# Patient Record
Sex: Male | Born: 1950 | Race: Black or African American | Hispanic: No | Marital: Single | Smoking: Former smoker
Health system: Southern US, Community
[De-identification: ages and names within clinical notes are randomized; demographics above are authoritative.]

## PROBLEM LIST (undated history)

## (undated) DIAGNOSIS — T7840XA Allergy, unspecified, initial encounter: Secondary | ICD-10-CM

## (undated) DIAGNOSIS — I10 Essential (primary) hypertension: Secondary | ICD-10-CM

## (undated) DIAGNOSIS — K5792 Diverticulitis of intestine, part unspecified, without perforation or abscess without bleeding: Secondary | ICD-10-CM

## (undated) DIAGNOSIS — E785 Hyperlipidemia, unspecified: Secondary | ICD-10-CM

## (undated) DIAGNOSIS — Z860100 Personal history of colon polyps, unspecified: Secondary | ICD-10-CM

## (undated) DIAGNOSIS — F17200 Nicotine dependence, unspecified, uncomplicated: Secondary | ICD-10-CM

## (undated) DIAGNOSIS — N529 Male erectile dysfunction, unspecified: Secondary | ICD-10-CM

## (undated) HISTORY — DX: Diverticulitis of intestine, part unspecified, without perforation or abscess without bleeding: K57.92

## (undated) HISTORY — DX: Essential (primary) hypertension: I10

## (undated) HISTORY — PX: COLONOSCOPY: SHX174

## (undated) HISTORY — DX: Allergy, unspecified, initial encounter: T78.40XA

## (undated) HISTORY — DX: Hyperlipidemia, unspecified: E78.5

## (undated) HISTORY — DX: Nicotine dependence, unspecified, uncomplicated: F17.200

## (undated) HISTORY — DX: Male erectile dysfunction, unspecified: N52.9

## (undated) HISTORY — DX: Personal history of colon polyps, unspecified: Z86.0100

## (undated) HISTORY — DX: Personal history of colonic polyps: Z86.010

## (undated) HISTORY — PX: POLYPECTOMY: SHX149

---

## 2001-12-14 ENCOUNTER — Encounter: Admission: RE | Admit: 2001-12-14 | Discharge: 2001-12-22 | Payer: Self-pay | Admitting: *Deleted

## 2002-12-18 ENCOUNTER — Encounter
Admission: RE | Admit: 2002-12-18 | Discharge: 2003-01-01 | Payer: Self-pay | Admitting: Physical Medicine & Rehabilitation

## 2005-11-15 ENCOUNTER — Encounter: Admission: RE | Admit: 2005-11-15 | Discharge: 2005-11-15 | Payer: Self-pay | Admitting: Family Medicine

## 2005-11-18 ENCOUNTER — Encounter: Admission: RE | Admit: 2005-11-18 | Discharge: 2005-11-18 | Payer: Self-pay | Admitting: Family Medicine

## 2005-11-25 ENCOUNTER — Ambulatory Visit: Payer: Self-pay | Admitting: Gastroenterology

## 2005-11-30 ENCOUNTER — Emergency Department (HOSPITAL_COMMUNITY): Admission: EM | Admit: 2005-11-30 | Discharge: 2005-12-01 | Payer: Self-pay | Admitting: Emergency Medicine

## 2005-12-01 ENCOUNTER — Ambulatory Visit: Payer: Self-pay | Admitting: Family Medicine

## 2005-12-08 ENCOUNTER — Ambulatory Visit: Payer: Self-pay | Admitting: Family Medicine

## 2005-12-14 ENCOUNTER — Encounter (INDEPENDENT_AMBULATORY_CARE_PROVIDER_SITE_OTHER): Payer: Self-pay | Admitting: Specialist

## 2005-12-14 ENCOUNTER — Ambulatory Visit: Payer: Self-pay | Admitting: Gastroenterology

## 2005-12-14 LAB — HM COLONOSCOPY

## 2006-01-12 ENCOUNTER — Ambulatory Visit: Payer: Self-pay | Admitting: Family Medicine

## 2006-05-20 ENCOUNTER — Ambulatory Visit: Payer: Self-pay | Admitting: Family Medicine

## 2006-07-21 ENCOUNTER — Ambulatory Visit: Payer: Self-pay | Admitting: Family Medicine

## 2006-10-27 ENCOUNTER — Ambulatory Visit: Payer: Self-pay | Admitting: Family Medicine

## 2006-12-12 ENCOUNTER — Ambulatory Visit: Payer: Self-pay | Admitting: Family Medicine

## 2006-12-29 ENCOUNTER — Encounter: Admission: RE | Admit: 2006-12-29 | Discharge: 2006-12-29 | Payer: Self-pay | Admitting: Family Medicine

## 2007-01-19 ENCOUNTER — Ambulatory Visit: Payer: Self-pay | Admitting: Family Medicine

## 2008-07-04 ENCOUNTER — Ambulatory Visit: Payer: Self-pay | Admitting: Family Medicine

## 2008-11-18 ENCOUNTER — Encounter (INDEPENDENT_AMBULATORY_CARE_PROVIDER_SITE_OTHER): Payer: Self-pay | Admitting: *Deleted

## 2009-04-23 ENCOUNTER — Ambulatory Visit: Payer: Self-pay | Admitting: Family Medicine

## 2009-07-14 ENCOUNTER — Ambulatory Visit: Payer: Self-pay | Admitting: Family Medicine

## 2009-07-24 ENCOUNTER — Ambulatory Visit: Payer: Self-pay | Admitting: Family Medicine

## 2009-08-08 ENCOUNTER — Telehealth: Payer: Self-pay | Admitting: Gastroenterology

## 2009-08-12 ENCOUNTER — Ambulatory Visit: Payer: Self-pay | Admitting: Family Medicine

## 2009-09-09 ENCOUNTER — Ambulatory Visit: Payer: Self-pay | Admitting: Physician Assistant

## 2010-03-09 ENCOUNTER — Ambulatory Visit: Payer: Self-pay | Admitting: Family Medicine

## 2010-07-30 NOTE — Progress Notes (Signed)
Summary: Schedule Colonoscopy  Phone Note Outgoing Call Call back at [facility] Phone [phone]   Call placed by: Harlow Mares CMA Duncan Dull),  August 08, 2009 3:15 PM Call placed to: Patient Summary of Call: Left message on patients machine to call back. patient needs colonoscopy Initial call taken by: Harlow Mares CMA Duncan Dull),  August 08, 2009 3:16 PM  Follow-up for Phone Call        Left message on patients machine to call back.  Follow-up by: Harlow Mares CMA Duncan Dull),  August 20, 2009 9:07 AM

## 2010-08-28 ENCOUNTER — Ambulatory Visit (INDEPENDENT_AMBULATORY_CARE_PROVIDER_SITE_OTHER): Payer: Managed Care, Other (non HMO) | Admitting: Family Medicine

## 2010-08-28 DIAGNOSIS — I10 Essential (primary) hypertension: Secondary | ICD-10-CM

## 2010-08-28 DIAGNOSIS — E785 Hyperlipidemia, unspecified: Secondary | ICD-10-CM

## 2010-08-28 DIAGNOSIS — E119 Type 2 diabetes mellitus without complications: Secondary | ICD-10-CM

## 2010-08-28 DIAGNOSIS — Z79899 Other long term (current) drug therapy: Secondary | ICD-10-CM

## 2010-09-08 ENCOUNTER — Ambulatory Visit (INDEPENDENT_AMBULATORY_CARE_PROVIDER_SITE_OTHER): Payer: Managed Care, Other (non HMO) | Admitting: Family Medicine

## 2010-09-08 DIAGNOSIS — R110 Nausea: Secondary | ICD-10-CM

## 2010-09-08 DIAGNOSIS — J309 Allergic rhinitis, unspecified: Secondary | ICD-10-CM

## 2010-12-16 ENCOUNTER — Encounter: Payer: Self-pay | Admitting: Family Medicine

## 2010-12-16 DIAGNOSIS — I10 Essential (primary) hypertension: Secondary | ICD-10-CM

## 2010-12-16 DIAGNOSIS — E291 Testicular hypofunction: Secondary | ICD-10-CM | POA: Insufficient documentation

## 2010-12-16 DIAGNOSIS — K635 Polyp of colon: Secondary | ICD-10-CM

## 2010-12-16 DIAGNOSIS — Z87891 Personal history of nicotine dependence: Secondary | ICD-10-CM | POA: Insufficient documentation

## 2010-12-16 DIAGNOSIS — E1165 Type 2 diabetes mellitus with hyperglycemia: Secondary | ICD-10-CM | POA: Insufficient documentation

## 2010-12-16 DIAGNOSIS — E1159 Type 2 diabetes mellitus with other circulatory complications: Secondary | ICD-10-CM | POA: Insufficient documentation

## 2010-12-16 DIAGNOSIS — F17200 Nicotine dependence, unspecified, uncomplicated: Secondary | ICD-10-CM | POA: Insufficient documentation

## 2010-12-16 DIAGNOSIS — K5790 Diverticulosis of intestine, part unspecified, without perforation or abscess without bleeding: Secondary | ICD-10-CM

## 2010-12-16 DIAGNOSIS — E118 Type 2 diabetes mellitus with unspecified complications: Secondary | ICD-10-CM | POA: Insufficient documentation

## 2011-01-06 ENCOUNTER — Encounter: Payer: Self-pay | Admitting: Family Medicine

## 2011-01-08 ENCOUNTER — Ambulatory Visit (INDEPENDENT_AMBULATORY_CARE_PROVIDER_SITE_OTHER): Payer: Managed Care, Other (non HMO) | Admitting: Family Medicine

## 2011-01-08 ENCOUNTER — Encounter: Payer: Self-pay | Admitting: Family Medicine

## 2011-01-08 DIAGNOSIS — I10 Essential (primary) hypertension: Secondary | ICD-10-CM

## 2011-01-08 DIAGNOSIS — E1159 Type 2 diabetes mellitus with other circulatory complications: Secondary | ICD-10-CM

## 2011-01-08 DIAGNOSIS — Z86010 Personal history of colonic polyps: Secondary | ICD-10-CM

## 2011-01-08 DIAGNOSIS — E291 Testicular hypofunction: Secondary | ICD-10-CM

## 2011-01-08 DIAGNOSIS — F17200 Nicotine dependence, unspecified, uncomplicated: Secondary | ICD-10-CM

## 2011-01-08 DIAGNOSIS — E119 Type 2 diabetes mellitus without complications: Secondary | ICD-10-CM

## 2011-01-08 DIAGNOSIS — E1169 Type 2 diabetes mellitus with other specified complication: Secondary | ICD-10-CM

## 2011-01-08 DIAGNOSIS — Z79899 Other long term (current) drug therapy: Secondary | ICD-10-CM

## 2011-01-08 DIAGNOSIS — Z0000 Encounter for general adult medical examination without abnormal findings: Secondary | ICD-10-CM

## 2011-01-08 DIAGNOSIS — E785 Hyperlipidemia, unspecified: Secondary | ICD-10-CM

## 2011-01-08 LAB — COMPREHENSIVE METABOLIC PANEL
ALT: 38 U/L (ref 0–53)
Albumin: 4.9 g/dL (ref 3.5–5.2)
Alkaline Phosphatase: 68 U/L (ref 39–117)
BUN: 29 mg/dL — ABNORMAL HIGH (ref 6–23)
Creat: 1.15 mg/dL (ref 0.50–1.35)
Glucose, Bld: 87 mg/dL (ref 70–99)
Sodium: 139 mEq/L (ref 135–145)

## 2011-01-08 LAB — POCT GLYCOSYLATED HEMOGLOBIN (HGB A1C): Hemoglobin A1C: 6.4

## 2011-01-08 LAB — LIPID PANEL
LDL Cholesterol: 56 mg/dL (ref 0–99)
VLDL: 20 mg/dL (ref 0–40)

## 2011-01-08 LAB — POCT URINALYSIS DIPSTICK
Bilirubin, UA: NEGATIVE
Blood, UA: NEGATIVE
Glucose, UA: NEGATIVE
Leukocytes, UA: NEGATIVE
pH, UA: 5

## 2011-01-08 LAB — CBC WITH DIFFERENTIAL/PLATELET
Basophils Absolute: 0 10*3/uL (ref 0.0–0.1)
Eosinophils Relative: 3 % (ref 0–5)
HCT: 47.6 % (ref 39.0–52.0)
Lymphocytes Relative: 45 % (ref 12–46)
MCH: 28.8 pg (ref 26.0–34.0)
MCHC: 33.4 g/dL (ref 30.0–36.0)
Neutro Abs: 3 10*3/uL (ref 1.7–7.7)
Neutrophils Relative %: 43 % (ref 43–77)
Platelets: 137 10*3/uL — ABNORMAL LOW (ref 150–400)
WBC: 6.9 10*3/uL (ref 4.0–10.5)

## 2011-01-08 NOTE — Progress Notes (Signed)
  Subjective:    Patient ID: Micheal Leblanc, male    DOB: [birth year], 60 y.o.   MRN: [id number]  HPI He is here for a complete examination. He does continue to smoke and is not ready to quit. He is stressed due to work related issues and is considering switching jobs. He did not start on his testosterone do to cost. He continues on the other medications listed in the chart. He exercises every other day walking his dog. He has a colonoscopy pending do to colonic polyps. His social and family history were reviewed and are in the record.   Review of Systems  Constitutional: Negative.   HENT: Negative.   Eyes: Negative.   Respiratory: Negative.   Cardiovascular: Negative.   Gastrointestinal: Negative.   Genitourinary: Negative.   Musculoskeletal: Negative.   Skin: Negative.   Neurological: Negative.        Objective:   Physical Exam BP 130/80  Pulse 81  Ht 5\' 11"  (1.803 m)  Wt 182 lb (82.555 kg)  BMI 25.38 kg/m2  General Appearance:    Alert, cooperative, no distress, appears stated age  Head:    Normocephalic, without obvious abnormality, atraumatic      Ears:    Normal TM's and external ear canals  Nose:   Nares normal, mucosa normal, no drainage or sinus   tenderness  Throat:   Lips, mucosa, and tongue normal; teeth and gums normal  Neck:   Supple, no lymphadenopathy;  thyroid:  no   enlargement/tenderness/nodules; no carotid   bruit or JVD  Back:    Spine nontender, no curvature, ROM normal, no CVA     tenderness  Lungs:     Clear to auscultation bilaterally without wheezes, rales or     ronchi; respirations unlabored  Chest Wall:    No tenderness or deformity   Heart:    Regular rate and rhythm, S1 and S2 normal, no murmur, rub   or gallop  Breast Exam:    No chest wall tenderness, masses or gynecomastia  Abdomen:     Soft, non-tender, nondistended, normoactive bowel sounds,    no masses, no hepatosplenomegaly  Genitalia:    Normal male external genitalia without  lesions.  Testicles without masses.  No inguinal hernias.  Rectal:    Normal sphincter tone, no masses or tenderness; guaiac negative stool.  Prostate smooth, no nodules, not enlarged.  Extremities:   No clubbing, cyanosis or edema  Pulses:   2+ and symmetric all extremities  Skin:   Skin color, texture, turgor normal, no rashes or lesions  Lymph nodes:   Cervical, supraclavicular, and axillary nodes normal  Neurologic:   CNII-XII intact, normal strength, sensation and gait; reflexes 2+ and symmetric throughout          Psych:   Normal mood, affect, hygiene and grooming.           Assessment & Plan:  Diabetes. ED Hypogonadism. Smoker. Colonic polyps. Hyperlipidemia. Hypertension. I again encouraged him to quit smoking. He will be set up for colonoscopy. We will call in testosterone to have a compounded. He will return here in one month for recheck on this. He blood screening.

## 2011-01-11 ENCOUNTER — Telehealth: Payer: Self-pay

## 2011-01-11 NOTE — Telephone Encounter (Signed)
Called pt left message labs ok continue pre. med

## 2011-02-22 ENCOUNTER — Telehealth: Payer: Self-pay | Admitting: Family Medicine

## 2011-02-22 MED ORDER — METFORMIN HCL 500 MG PO TABS: 500.0000 mg | ORAL_TABLET | Freq: Two times a day (BID) | ORAL | Status: DC

## 2011-02-22 NOTE — Telephone Encounter (Signed)
REFILLED PT MEDS

## 2011-03-12 ENCOUNTER — Encounter: Payer: Self-pay | Admitting: Family Medicine

## 2011-03-12 ENCOUNTER — Ambulatory Visit (INDEPENDENT_AMBULATORY_CARE_PROVIDER_SITE_OTHER): Payer: Managed Care, Other (non HMO) | Admitting: Family Medicine

## 2011-03-12 VITALS — BP 118/70 | HR 72 | Ht 71.0 in | Wt 185.0 lb

## 2011-03-12 DIAGNOSIS — E291 Testicular hypofunction: Secondary | ICD-10-CM

## 2011-03-12 NOTE — Progress Notes (Signed)
  Subjective:    Patient ID: Micheal Leblanc, male    DOB: 04/14/1951, 60 y.o.   MRN: [id number]  HPI He is here for a recheck on his testosterone. He has been getting testosterone compounded and having no difficulty with this.  Review of Systems     Objective:   Physical Exam Alert and in no distress otherwise not examined       Assessment & Plan:

## 2011-03-13 LAB — TESTOSTERONE: Testosterone: 221.19 ng/dL — ABNORMAL LOW (ref 250–890)

## 2011-03-15 ENCOUNTER — Telehealth: Payer: Self-pay

## 2011-03-15 NOTE — Telephone Encounter (Signed)
Called to inform pt that he is not at goal and to find out his regament

## 2011-03-16 NOTE — Telephone Encounter (Signed)
Called pt for 2nd time left message to call me back

## 2011-03-23 NOTE — Progress Notes (Signed)
Waiting for pt to call back have left 3 messages to call me back

## 2011-03-26 ENCOUNTER — Other Ambulatory Visit: Payer: Self-pay

## 2011-03-26 NOTE — Telephone Encounter (Signed)
[facility]ed [facility] to give new rx for teststerone gel

## 2011-03-30 ENCOUNTER — Ambulatory Visit (INDEPENDENT_AMBULATORY_CARE_PROVIDER_SITE_OTHER): Payer: Managed Care, Other (non HMO) | Admitting: Medical

## 2011-03-30 ENCOUNTER — Other Ambulatory Visit: Payer: Self-pay

## 2011-03-30 ENCOUNTER — Encounter: Payer: Self-pay | Admitting: Medical

## 2011-03-30 VITALS — BP 140/80 | HR 72 | Temp 98.1°F | Resp 16 | Ht 71.0 in | Wt 186.0 lb

## 2011-03-30 DIAGNOSIS — M549 Dorsalgia, unspecified: Secondary | ICD-10-CM

## 2011-03-30 DIAGNOSIS — IMO0001 Reserved for inherently not codable concepts without codable children: Secondary | ICD-10-CM

## 2011-03-30 DIAGNOSIS — M791 Myalgia, unspecified site: Secondary | ICD-10-CM

## 2011-03-30 DIAGNOSIS — F17200 Nicotine dependence, unspecified, uncomplicated: Secondary | ICD-10-CM

## 2011-03-30 DIAGNOSIS — R5383 Other fatigue: Secondary | ICD-10-CM

## 2011-03-30 LAB — POCT URINALYSIS DIPSTICK
Glucose, UA: NEGATIVE
Ketones, UA: NEGATIVE
Leukocytes, UA: NEGATIVE

## 2011-03-30 MED ORDER — OLMESARTAN-AMLODIPINE-HCTZ 40-5-12.5 MG PO TABS: 1.0000 | ORAL_TABLET | Freq: Every day | ORAL | Status: DC

## 2011-03-30 MED ORDER — METFORMIN HCL 500 MG PO TABS: 500.0000 mg | ORAL_TABLET | Freq: Two times a day (BID) | ORAL | Status: DC

## 2011-03-30 MED ORDER — ATORVASTATIN CALCIUM 20 MG PO TABS: 20.0000 mg | ORAL_TABLET | Freq: Every day | ORAL | Status: DC

## 2011-03-30 MED ORDER — CYCLOBENZAPRINE HCL 10 MG PO TABS: ORAL_TABLET | ORAL | Status: DC

## 2011-03-30 NOTE — Progress Notes (Signed)
  Subjective:   HPI  Micheal Leblanc is a 60 y.o. male who presents for 1 week hx/o soreness.   He reports that he has been working a lot of overtime recently.   A week ago started feeling a "catch" in his left back that he describes as soreness that radiates down to flanks and around chest.  It is worse at the end of the day, notices the pain more when sitting at the end of the day, has to take Ibuprofen for some relief.  He note that his whole chest is sore.  He wonders if he is coming down with something like chest illness.  He notes mild runny nose, but otherwise no respiratory symptoms.  He is a long time smoker, 1ppd.  He denies hx/o renal stones, pancreatitis, abdominal pain, no recent chest pain, palpitations, edema, SOB, or DOE.  Otherwise has been in his usual state of health.  No other aggravating or relieving factors.  No other c/o.  The following portions of the patient's history were reviewed and updated as appropriate: allergies, current medications, past family history, past medical history, past social history, past surgical history and problem list.  Past Medical History  Diagnosis Date  . Hypertension   . Diabetes mellitus   . Smoker   . Hyperlipidemia   . ED (erectile dysfunction)   . Diverticulitis   . Hx of colonic polyps    No past surgical history on file.  Review of Systems Constitutional: denies fever, chills, sweats, unexpected weight change, anorexia, fatigue Allergy: no congestion, sneezing Dermatology: denies rash ENT: +mild runny nose, but no ear pain, sore throat, hoarseness, sinus pain Cardiology: denies chest pain, palpitations, edema Respiratory: denies cough, shortness of breath, wheezing Gastroenterology: denies abdominal pain, nausea, vomiting, diarrhea, constipation Musculoskeletal: denies arthralgias, myalgias, joint swelling Urology: denies dysuria, difficulty urinating    Objective:   Physical Exam  General appearance: alert, no distress,  WD/WN Skin: warm, dry HEENT: normocephalic, sclerae anicteric, PERRLA, EOMi, nares patent, no discharge or erythema, pharynx normal Oral cavity: MMM, no lesions Neck: supple, no lymphadenopathy, no thyromegaly, no masses Heart: RRR, normal S1, S2, no murmurs Lungs: bronchial sounds, right lower fields with few rhonchi, no rales or wheezes Abdomen: +bs, soft, non tender, non distended, no masses, no hepatomegaly, no splenomegaly Back: non tender, normal ROM Musculoskeletal: non tender, no swelling, no obvious deformity Extremities: no edema, no cyanosis, no clubbing Pulses: 2+ symmetric, upper and lower extremities, normal cap refill  Assessment :    Encounter Diagnoses  Name Primary?  . Back pain Yes  . Myalgia   . Fatigue   . Tobacco use disorder     Plan:    Etiology unclear.  CXR today with no acute changes, no obvious pneumonia or mass.  Will send for overread.  Reviewed labs from visit in July regarding his routine diabetic follow up visit.  Advised relative rest, apply heat to back such as warm bath, gentle stretching and ROM of back, Ibuprofen or Aleve OTC, and will send script for muscle relaxer to help with myalgias and back/chest soreness.  Advised he call 1-800-QUIT-NOW for help on smoking cessation.  He seems somewhat motivated to stop smoking. Refilled his medications today.  Call/return if worse or new symptoms in the next 3-5 days.

## 2011-04-06 ENCOUNTER — Telehealth: Payer: Self-pay | Admitting: Family Medicine

## 2011-04-06 ENCOUNTER — Other Ambulatory Visit: Payer: Self-pay | Admitting: Medical

## 2011-04-06 MED ORDER — AZITHROMYCIN 250 MG PO TABS: ORAL_TABLET | ORAL | Status: AC

## 2011-04-06 NOTE — Telephone Encounter (Signed)
i sent round of Zpak antibiotic.   Xray over read suggested possible early pneumonia.  Thus, I sent antibiotic.  Have him take this and call back in 4-5 days to let me know if any change in his symptoms.

## 2011-04-06 NOTE — Telephone Encounter (Signed)
Patient was notified of his xray report and that his medication was called out to the pharmacy. CLS

## 2011-04-06 NOTE — Telephone Encounter (Signed)
Message copied by Janeice Robinson on Tue Apr 06, 2011  2:47 PM ------      Message from: Jac Canavan      Created: Tue Apr 06, 2011  1:31 PM       Call and see if he is feeling ok since I last saw him.  I have the chest xray overread back.  Let me know.

## 2011-04-20 ENCOUNTER — Encounter: Payer: Self-pay | Admitting: Medical

## 2011-04-20 ENCOUNTER — Ambulatory Visit (INDEPENDENT_AMBULATORY_CARE_PROVIDER_SITE_OTHER): Payer: Managed Care, Other (non HMO) | Admitting: Medical

## 2011-04-20 VITALS — BP 138/80 | HR 62 | Temp 97.8°F | Resp 16 | Wt 188.0 lb

## 2011-04-20 DIAGNOSIS — Z79899 Other long term (current) drug therapy: Secondary | ICD-10-CM

## 2011-04-20 DIAGNOSIS — G479 Sleep disorder, unspecified: Secondary | ICD-10-CM

## 2011-04-20 DIAGNOSIS — IMO0001 Reserved for inherently not codable concepts without codable children: Secondary | ICD-10-CM

## 2011-04-20 DIAGNOSIS — M791 Myalgia, unspecified site: Secondary | ICD-10-CM | POA: Insufficient documentation

## 2011-04-20 LAB — CK: Total CK: 294 U/L — ABNORMAL HIGH (ref 7–232)

## 2011-04-20 LAB — BASIC METABOLIC PANEL
Creat: 1.21 mg/dL (ref 0.50–1.35)
Sodium: 143 mEq/L (ref 135–145)

## 2011-04-20 MED ORDER — OLMESARTAN-AMLODIPINE-HCTZ 40-5-25 MG PO TABS: 1.0000 | ORAL_TABLET | Freq: Every day | ORAL | Status: DC

## 2011-04-20 NOTE — Progress Notes (Signed)
Subjective:   HPI  Micheal Leblanc is a 60 y.o. male who presents for f/u.  I saw him a few weeks ago for chest soreness.  At that time, given his chest soreness, smoking history, we got a CXR and radiology over read suggested early pneumonia.  He was put on a round of Zpak and say he has felt no different.  No worse, no better.  He doesn't feel sick, denies URI symptoms or cough.  He still notes left upper chest soreness, and some anterior chest soreness, worse on left.  He feels like he gets a catch in his chest and back with certain movement.  Ribs feel sore, and sometimes low back hurts.  He felt achy all over recently after washing his car.     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  He only gets 4-5 hours of sleep before the paper route, and another 1-2 hours of sleep before his full time job.  He has been doing this for years, but just recently started getting the soreness.  Uses Ibuprofen with some relief.  No other aggravating or relieving factors.    No other c/o.  Of note, he is on [facility].   The following portions of the patient's history were reviewed and updated as appropriate: allergies, current medications, past family history, past medical history, past social history, past surgical history and problem list.  No Known Allergies  Current Outpatient Prescriptions on File Prior to Visit  Medication Sig Dispense Refill  . atorvastatin (LIPITOR) 20 MG tablet Take 1 tablet (20 mg total) by mouth daily.  30 tablet  5  . cyclobenzaprine (FLEXERIL) 10 MG tablet 1/2- 1 tablet po QHS or up to TID for spasm  20 tablet  0  . metFORMIN (GLUCOPHAGE) 500 MG tablet Take 1 tablet (500 mg total) by mouth 2 (two) times daily with a meal.  60 tablet  5    Past Medical History  Diagnosis Date  .  Hypertension   . Diabetes mellitus   . Smoker   . Hyperlipidemia   . ED (erectile dysfunction)   . Diverticulitis   . Hx of colonic polyps     No past surgical history on file.  Family History  Problem Relation Age of Onset  . Arthritis Mother     History   Social History  . Marital Status: Single    Spouse Name: N/A    Number of Children: N/A  . Years of Education: N/A   Occupational History  . Not on file.   Social History Main Topics  . Smoking status: Current Everyday Smoker -- 0.5 packs/day  . Smokeless tobacco: Never Used  . Alcohol Use: 2.4 oz/week    2 Cans of beer, 2 Shots of liquor per week  . Drug Use: No  . Sexually Active: Not Currently   Other Topics Concern  . Not on file   Social History Narrative  . No narrative on file   Review of Systems Constitutional: -fever, -chills, +sweats, -unexpected -weight change+fatigue ENT: -runny nose, -ear pain, -sore throat Cardiology:  -chest pain, -palpitations, -edema Respiratory: -cough, -shortness of breath, -wheezing Gastroenterology: -abdominal pain, -nausea, -vomiting, -diarrhea, -constipation Hematology: -bleeding or bruising problems Musculoskeletal: -arthralgias, -myalgias, -joint swelling, +back pain Ophthalmology: +vision  changes Urology: -dysuria, -difficulty urinating, -hematuria, -urinary frequency, -urgency Neurology: -headache, -weakness, -tingling, -numbness    Objective:   Physical Exam  Filed Vitals:   04/20/11 1620  BP: 138/80  Pulse: 62  Temp: 97.8 F (36.6 C)  Resp: 16    General appearance: alert, no distress, WD/WN Skin: unremarkable HEENT: normocephalic, sclerae anicteric, TMs pearly, nares patent, no discharge or erythema, pharynx normal Oral cavity: MMM, no lesions Neck: supple, no lymphadenopathy, no thyromegaly, no masses Heart: RRR, normal S1, S2, no murmurs Lungs: CTA bilaterally, no wheezes, rhonchi, or rales Chest: tender along left lower rib cage and left upper  back paraspinal, no obvious abnormality, normal expansion MSK: bilat shoulder exam normal, arm exam unremarkable Abdomen: +bs, soft, non tender, non distended, no masses, no hepatomegaly, no splenomegaly Pulses: 2+ symmetric, upper and lower extremities, normal cap refill   Assessment and Plan :    Encounter Diagnoses  Name Primary?  . Myalgia Yes  . Encounter for long-term (current) use of other medications   . 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  This seems to be overuse and lack of reasonable rest.   Advised he take a vacation, I gave contact info for massage therapist, and asked him to consider cutting back on his work hours.  He can use Aleve OTC prn once to twice daily.  Labs today for BMET and CPK.  Sleep - advised 7-8 hours sleep nightly.  He is only getting 4-5 hours + a 1-2 hour nap daily, split up.    Follow-up pending labs.

## 2011-04-23 ENCOUNTER — Ambulatory Visit: Payer: Managed Care, Other (non HMO) | Admitting: Medical

## 2011-08-02 ENCOUNTER — Encounter: Payer: Self-pay | Admitting: Medical

## 2011-08-02 ENCOUNTER — Ambulatory Visit (INDEPENDENT_AMBULATORY_CARE_PROVIDER_SITE_OTHER): Payer: Managed Care, Other (non HMO) | Admitting: Medical

## 2011-08-02 ENCOUNTER — Encounter: Payer: Self-pay | Admitting: Internal Medicine

## 2011-08-02 VITALS — BP 120/80 | HR 62 | Temp 98.5°F | Resp 16 | Wt 186.0 lb

## 2011-08-02 DIAGNOSIS — J40 Bronchitis, not specified as acute or chronic: Secondary | ICD-10-CM

## 2011-08-02 DIAGNOSIS — R197 Diarrhea, unspecified: Secondary | ICD-10-CM | POA: Insufficient documentation

## 2011-08-02 DIAGNOSIS — J329 Chronic sinusitis, unspecified: Secondary | ICD-10-CM | POA: Insufficient documentation

## 2011-08-02 DIAGNOSIS — M2550 Pain in unspecified joint: Secondary | ICD-10-CM | POA: Insufficient documentation

## 2011-08-02 DIAGNOSIS — M791 Myalgia, unspecified site: Secondary | ICD-10-CM

## 2011-08-02 DIAGNOSIS — IMO0001 Reserved for inherently not codable concepts without codable children: Secondary | ICD-10-CM

## 2011-08-02 MED ORDER — BENZONATATE 100 MG PO CAPS: 100.0000 mg | ORAL_CAPSULE | Freq: Three times a day (TID) | ORAL | Status: AC | PRN

## 2011-08-02 MED ORDER — LEVOFLOXACIN 500 MG PO TABS: 500.0000 mg | ORAL_TABLET | Freq: Every day | ORAL | Status: AC

## 2011-08-02 NOTE — Progress Notes (Signed)
Subjective:  Micheal Leblanc is a 61 y.o. male who presents for 1 wk hx/o worsening chest congestion, head congestion, runny nose, hoarseness, coughing all last week, productive sputum.  Using Mucinex D and Robitussin DM.  He notes subjective fever. Denies sick contacts.  No other aggravating or relieving factors.  No other c/o.  He stopped smoking 2 wk ago.   The following portions of the patient's history were reviewed and updated as appropriate: allergies, current medications, past family history, past medical history, past social history, past surgical history and problem list.  ROS: MSK: Upper thigh and hip aches GI: ongoing diarrhea, 6 stools daily on metformin   Objective:   Filed Vitals:   08/02/11 1618  BP: 120/80  Pulse: 62  Temp: 98.5 F (36.9 C)  Resp: 16    General appearance: Alert, WD/WN, no distress, ill appearing                             Skin: warm, no rash, no diaphoresis                           Head: mild sinus tenderness                            Eyes: conjunctiva normal, corneas clear, PERRLA                            Ears: TMs bilat somewhat retracted, reduced light reflex, external ear canals normal                          Nose: septum midline, turbinates swollen, with erythema and clear discharge             Mouth/throat: MMM, tongue normal, mild pharyngeal erythema                           Neck: supple, no adenopathy, no thyromegaly, nontender                          Heart: RRR, normal S1, S2, no murmurs                         Lungs: +bronchial breath sounds, +scattered rhonchi, no wheezes, no rales                Extremities: no edema, nontender     Assessment and Plan:   Encounter Diagnoses  Name Primary?  . Sinobronchitis Yes  . Myalgia   . Arthralgia   . Diarrhea     Prescription given today for Levaquin as below.  Discussed diagnosis and treatment of bronchitis.  Suggested symptomatic OTC remedies for cough and congestion.  Nasal  saline spray for nasal congestion.  Tylenol or Ibuprofen OTC for fever and malaise.  Call/return in 2-3 days if symptoms are worse or not improving.  Advised that cough may linger even after the infection is improved.    Return soon for chronic disease recheck/med check, labs, and possible medication changes as I suspect some medication adverse effects of possibly Metformin and statin.

## 2011-08-02 NOTE — Patient Instructions (Signed)
Begin Levaquin antibiotic once daily for a week.  You can finish the cough/congestion medication you have, but if you need more, try either Coricidin HBP or Mucinex DM OTC for cough/congestion.   Avoid sudafed as this can raise your blood pressure.   Increase your water intake while sick, rest, if not improving by end of the week, let me know.   Return soon fasting for labs and recheck so we can discuss medication options.

## 2011-08-20 ENCOUNTER — Encounter: Payer: Self-pay | Admitting: Medical

## 2011-08-20 ENCOUNTER — Ambulatory Visit (INDEPENDENT_AMBULATORY_CARE_PROVIDER_SITE_OTHER): Payer: Managed Care, Other (non HMO) | Admitting: Medical

## 2011-08-20 ENCOUNTER — Other Ambulatory Visit: Payer: Self-pay | Admitting: Medical

## 2011-08-20 VITALS — BP 130/88 | HR 78 | Temp 98.6°F | Resp 16 | Wt 188.0 lb

## 2011-08-20 DIAGNOSIS — L02412 Cutaneous abscess of left axilla: Secondary | ICD-10-CM

## 2011-08-20 DIAGNOSIS — IMO0002 Reserved for concepts with insufficient information to code with codable children: Secondary | ICD-10-CM

## 2011-08-20 MED ORDER — DOXYCYCLINE HYCLATE 100 MG PO TABS: 100.0000 mg | ORAL_TABLET | Freq: Two times a day (BID) | ORAL | Status: AC

## 2011-08-20 NOTE — Patient Instructions (Signed)

## 2011-08-20 NOTE — Progress Notes (Signed)
Subjective:  Micheal Leblanc is a 61 y.o. male who presents for evaluation of a probable cutaneous abscess. Lesion is located in the left axilla. Onset was 2 weeks ago. Symptoms have progressed to a point and plateaued.  Abscess has associated symptoms of none. Patient does not have previous history of cutaneous abscesses.   Past Medical History  Diagnosis Date  . Hypertension   . Diabetes mellitus   . Smoker   . Hyperlipidemia   . ED (erectile dysfunction)   . Diverticulitis   . Hx of colonic polyps      Objective:    There is an area characterized by a subcutaneous mass consistent with a cutaneous abscess measuring 3 cm in greatest dimension. Location: left axilla.   Assessment:   Encounter Diagnosis  Name Primary?  Marland Kitchen Abscess of axilla, left Yes     Plan:    Apply hot compresses frequently to promote drainage. Reassured that this represents a benign process. Oral antibiotics -- see med orders.  Wound culture sent.  Call/return if worse in meantime, and call report 2-3 days.

## 2011-08-24 LAB — WOUND CULTURE
Gram Stain: NONE SEEN
Gram Stain: NONE SEEN

## 2011-08-26 ENCOUNTER — Ambulatory Visit (INDEPENDENT_AMBULATORY_CARE_PROVIDER_SITE_OTHER): Payer: Managed Care, Other (non HMO) | Admitting: Family Medicine

## 2011-08-26 ENCOUNTER — Encounter: Payer: Self-pay | Admitting: Family Medicine

## 2011-08-26 ENCOUNTER — Other Ambulatory Visit: Payer: Self-pay

## 2011-08-26 DIAGNOSIS — F17200 Nicotine dependence, unspecified, uncomplicated: Secondary | ICD-10-CM

## 2011-08-26 DIAGNOSIS — I10 Essential (primary) hypertension: Secondary | ICD-10-CM

## 2011-08-26 DIAGNOSIS — Z79899 Other long term (current) drug therapy: Secondary | ICD-10-CM

## 2011-08-26 DIAGNOSIS — M1990 Unspecified osteoarthritis, unspecified site: Secondary | ICD-10-CM | POA: Insufficient documentation

## 2011-08-26 DIAGNOSIS — E119 Type 2 diabetes mellitus without complications: Secondary | ICD-10-CM

## 2011-08-26 DIAGNOSIS — E291 Testicular hypofunction: Secondary | ICD-10-CM

## 2011-08-26 DIAGNOSIS — M129 Arthropathy, unspecified: Secondary | ICD-10-CM

## 2011-08-26 DIAGNOSIS — Z23 Encounter for immunization: Secondary | ICD-10-CM

## 2011-08-26 DIAGNOSIS — E785 Hyperlipidemia, unspecified: Secondary | ICD-10-CM

## 2011-08-26 LAB — CBC WITH DIFFERENTIAL/PLATELET
Basophils Absolute: 0 10*3/uL (ref 0.0–0.1)
Basophils Relative: 0 % (ref 0–1)
Eosinophils Absolute: 0.4 10*3/uL (ref 0.0–0.7)
Eosinophils Relative: 5 % (ref 0–5)
Lymphocytes Relative: 49 % — ABNORMAL HIGH (ref 12–46)
MCH: 28.5 pg (ref 26.0–34.0)
MCV: 84 fL (ref 78.0–100.0)
Monocytes Relative: 6 % (ref 3–12)
RDW: 13.1 % (ref 11.5–15.5)
WBC: 7.1 10*3/uL (ref 4.0–10.5)

## 2011-08-26 LAB — COMPREHENSIVE METABOLIC PANEL
AST: 24 U/L (ref 0–37)
Alkaline Phosphatase: 89 U/L (ref 39–117)
CO2: 27 mEq/L (ref 19–32)
Chloride: 103 mEq/L (ref 96–112)
Potassium: 3.8 mEq/L (ref 3.5–5.3)
Total Protein: 7 g/dL (ref 6.0–8.3)

## 2011-08-26 LAB — LIPID PANEL
HDL: 38 mg/dL — ABNORMAL LOW (ref >39)
LDL Cholesterol: 44 mg/dL (ref 0–99)
Total CHOL/HDL Ratio: 2.9 Ratio
Triglycerides: 134 mg/dL (ref <150)

## 2011-08-26 LAB — POCT UA - MICROALBUMIN
Albumin/Creatinine Ratio, Urine, POC: 42.5
Creatinine, POC: 160.6 mg/dL

## 2011-08-26 MED ORDER — METFORMIN HCL ER (MOD) 1000 MG PO TB24: 1000.0000 mg | ORAL_TABLET | Freq: Every day | ORAL | Status: DC

## 2011-08-26 NOTE — Progress Notes (Signed)
  Subjective:    Patient ID: Micheal Leblanc, male    DOB: 02-Mar-1951, 61 y.o.   MRN: [id number]  HPI He is here for a recheck. He stopped taking his metformin due to 2 diarrhea. He also stopped using his testosterone due to cost. He has been off of both of these for several months. He does continue to smoke but has apparently decreased his consumption. His main complaint is difficulty with bilateral hip pain it usually bothers him more towards the end of the week. He has a relatively sedentary job. He states he walks 3 days per week. He does periodically check his feet. He did have an eye exam done in the fall.   Review of Systems     Objective:   Physical Exam Alert and in no distress. Exam of his hips shows limitation of internal rotation with good external rotation as well as flexion. Hemoglobin A1c is 8.1       Assessment & Plan:   1. Smoker    2. Hypogonadism, male  Testosterone  3. Diabetes mellitus  POCT UA - Microalbumin, Hepatitis B surface antibody, CBC with Differential, Comprehensive metabolic panel, Lipid panel  4. HTN (hypertension)  CBC with Differential, Comprehensive metabolic panel  5. Hyperlipidemia LDL goal <70  Lipid panel  6. Arthritis    7. Encounter for long-term (current) use of other medications  Varicella-zoster vaccine subcutaneous, Testosterone, POCT UA - Microalbumin, Hepatitis B surface antibody, CBC with Differential, Comprehensive metabolic panel, Lipid panel   I will place him on extended release metformin. Renew his testosterone and give him a discount card. Encouraged him to use Tylenol for his hip pain and if no improvement, then use an anti-inflammatory. Encouraged to become more physically active. We also discussed smoking cessation. Check here in 4 months.

## 2011-08-26 NOTE — Patient Instructions (Addendum)
Use Tylenol first for your hip pain and if that doesn't work then switch to Advil or Aleve. If you keep having trouble then call me If you have trouble with the metformin call me

## 2011-08-26 NOTE — Telephone Encounter (Signed)
androgel called in 

## 2011-08-27 LAB — HEPATITIS B SURFACE ANTIBODY, QUANTITATIVE: Hepatitis B-Post: 0.1 m[IU]/mL

## 2011-08-30 NOTE — Progress Notes (Signed)
Left message for pt to call me back 

## 2011-09-06 ENCOUNTER — Other Ambulatory Visit (INDEPENDENT_AMBULATORY_CARE_PROVIDER_SITE_OTHER): Payer: Managed Care, Other (non HMO)

## 2011-09-06 DIAGNOSIS — Z0000 Encounter for general adult medical examination without abnormal findings: Secondary | ICD-10-CM

## 2011-09-06 DIAGNOSIS — Z23 Encounter for immunization: Secondary | ICD-10-CM

## 2011-09-13 ENCOUNTER — Encounter: Payer: Self-pay | Admitting: Family Medicine

## 2011-09-13 ENCOUNTER — Ambulatory Visit (INDEPENDENT_AMBULATORY_CARE_PROVIDER_SITE_OTHER): Payer: Managed Care, Other (non HMO) | Admitting: Family Medicine

## 2011-09-13 DIAGNOSIS — E119 Type 2 diabetes mellitus without complications: Secondary | ICD-10-CM

## 2011-09-13 MED ORDER — INSULIN GLARGINE 100 UNIT/ML ~~LOC~~ SOLN: 10.0000 [IU] | Freq: Every day | SUBCUTANEOUS | Status: DC

## 2011-09-13 NOTE — Patient Instructions (Signed)
Use insulin once per day. Check your blood sugars twice a day either before a meal or 2 hours after a meal. Also do put you on a new medicine to be taken once per day. Take 2 of the metformin per day Call me daily to let me know how your blood sugars are doing

## 2011-09-13 NOTE — Progress Notes (Signed)
  Subjective:    Patient ID: Micheal Leblanc, male    DOB: 02/12/1951, 61 y.o.   MRN: [id number]  HPI He is here for consultation concerning his diabetes. Approximately one week ago he had noted the onset of blurred vision as well as polyuria. He admits to not checking his blood sugars until yesterday when he noted blood sugar readings in the 400 range. Presently he is on Glumeza 1000mg .   Review of Systems     Objective:   Physical Exam Alert and in no distress. Blood sugar 473.       Assessment & Plan:  Worsening diabetes. He is to increase his metformin to 2000mg /day. I'll also give a sample of Actos 30 mg. He will be started on Lantus insulin and call me daily with blood sugars. I will work to get his blood sugar under 200.

## 2011-09-14 ENCOUNTER — Telehealth: Payer: Self-pay | Admitting: Internal Medicine

## 2011-09-14 NOTE — Telephone Encounter (Signed)
Per Dr. Susann Givens, increase to 12 units and take emetrol anti nausea liquid med, and call tomorrow for sugar readings.  Pt asked what food he can eat and per shane tysinger, stay away from carbs, eat lots of green veggies, eat 2-3 servings of fruit. Eat lean meat like chicken fish and [place].

## 2011-09-15 NOTE — Telephone Encounter (Signed)
Have him increase to 14 units of Lantus and have him return here tomorrow for a recheck

## 2011-09-15 NOTE — Telephone Encounter (Signed)
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 Pt wants to know what all he can drink? I told him water and no juices cause it contains sugar but not sure he is listening to me. He is at home resting

## 2011-09-15 NOTE — Telephone Encounter (Signed)
Left word for word message and to call and make appt for tommmrow

## 2011-09-15 NOTE — Telephone Encounter (Signed)
Pt called for his sugar readings. 8:50am-346, 2:00am-300 which he took 12 units of isulins, 6:20pm-360, 12:40pm-479

## 2011-09-16 ENCOUNTER — Encounter: Payer: Self-pay | Admitting: Family Medicine

## 2011-09-16 ENCOUNTER — Ambulatory Visit (INDEPENDENT_AMBULATORY_CARE_PROVIDER_SITE_OTHER): Payer: Managed Care, Other (non HMO) | Admitting: Family Medicine

## 2011-09-16 ENCOUNTER — Telehealth: Payer: Self-pay | Admitting: Internal Medicine

## 2011-09-16 VITALS — BP 110/70 | HR 92 | Wt 175.0 lb

## 2011-09-16 DIAGNOSIS — E1165 Type 2 diabetes mellitus with hyperglycemia: Secondary | ICD-10-CM

## 2011-09-16 NOTE — Progress Notes (Signed)
  Subjective:    Patient ID: Micheal Leblanc, male    DOB: August 19, 1950, 62 y.o.   MRN: [id number]  HPI He is here for recheck. He continues to have difficulty with elevated blood sugars as well as blurred vision and fatigue.   Review of Systems     Objective:   Physical Exam Alert and in no distress. Blood pressure here was in the high 300 range. NovoLog 10 units was given with reduction of his blood sugar down to 265.        Assessment & Plan:  Diabetes not under control. We'll increase his Lantus insulin to 20 units and call me in the morning with his blood sugar reading. He has been unable to work and I will cover him for this.

## 2011-09-17 NOTE — Telephone Encounter (Signed)
He is to call me in the morning with his blood sugar readings. I will readjust based on that

## 2011-09-17 NOTE — Telephone Encounter (Signed)
Pt called and said he didn't check his sugar all day after he left here. 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 At 9:25am it was up to 251. Pt is still having blurring vision and he does get lightheaded when he is up moving around. Pt wants to know if he needs to go back to work on Monday. He has not been able to work due to vision blurrness. Call pt and let him know what to do

## 2011-09-20 ENCOUNTER — Telehealth: Payer: Self-pay | Admitting: Internal Medicine

## 2011-09-20 MED ORDER — GLUCOSE BLOOD VI STRP: 1.0000 | ORAL_STRIP | Freq: Two times a day (BID) | Status: DC

## 2011-09-20 NOTE — Telephone Encounter (Signed)
pt called stating his sugar readings. yesterday at 11:30am-207, 5:30pm-301 which he said you increased his insulin to 26 units. Today readings was 2:00am-265 which he took his 26 units and at 9:00am-208. pt is still having vision blurriness but its better than it first was. He is at home again today.

## 2011-09-20 NOTE — Telephone Encounter (Signed)
Talked with pt gave him word for word What Dr.lalonde said

## 2011-09-20 NOTE — Telephone Encounter (Signed)
Pt informed word for word  

## 2011-09-20 NOTE — Telephone Encounter (Signed)
Have him increase his insulin to 30 units and again check back with me tomorrow let him know I will cover him for work since he is still having difficulty with blurred vision

## 2011-09-21 ENCOUNTER — Telehealth: Payer: Self-pay | Admitting: Internal Medicine

## 2011-09-21 NOTE — Telephone Encounter (Signed)
Have him come in to see me tomorrow.

## 2011-09-21 NOTE — Telephone Encounter (Signed)
Pt is coming in.

## 2011-09-22 ENCOUNTER — Ambulatory Visit (INDEPENDENT_AMBULATORY_CARE_PROVIDER_SITE_OTHER): Payer: Managed Care, Other (non HMO) | Admitting: Family Medicine

## 2011-09-22 ENCOUNTER — Encounter: Payer: Self-pay | Admitting: Family Medicine

## 2011-09-22 VITALS — BP 120/80 | HR 72 | Wt 177.0 lb

## 2011-09-22 DIAGNOSIS — E1165 Type 2 diabetes mellitus with hyperglycemia: Secondary | ICD-10-CM

## 2011-09-22 NOTE — Patient Instructions (Signed)
Use 34 units of the insulin. If your sugars start in the morning to drop below 80 then we need to readjust. You may return to work on Monday

## 2011-09-22 NOTE — Progress Notes (Signed)
  Subjective:    Patient ID: Micheal Leblanc, male    DOB: [birth year], 61 y.o.   MRN: [id number]  HPI He is here for recheck. He now has a least one touch sugar reading in the 150 range. His vision is slowly improving.   Review of Systems     Objective:   Physical Exam Alert and in no distress otherwise not examined       Assessment & Plan:   1. Diabetes mellitus out of control    He will increase his insulin to 34 units. I will to return to work on Monday. He is to check back with me soon after that. Discussed blood sugars and recommended that his blood sugar drops below 80, he is to call me and we will back off on his insulin. Discussed the fact that hopefully we will eventually be able to stop the insulin.

## 2011-09-23 NOTE — Telephone Encounter (Signed)
pts called and stated his readings 9:00am-134 and 2:12am-187 and hes at 34 units and he said hes feeling much better and going back to work [employer]

## 2011-09-27 ENCOUNTER — Ambulatory Visit: Payer: Managed Care, Other (non HMO) | Admitting: Family Medicine

## 2011-10-06 ENCOUNTER — Other Ambulatory Visit: Payer: Managed Care, Other (non HMO)

## 2011-10-06 DIAGNOSIS — Z23 Encounter for immunization: Secondary | ICD-10-CM

## 2011-10-11 ENCOUNTER — Telehealth: Payer: Self-pay | Admitting: Internal Medicine

## 2011-10-11 NOTE — Telephone Encounter (Signed)
Pt was notified and has an appt 4/25 @4 :15pm to see you

## 2011-10-11 NOTE — Telephone Encounter (Signed)
Have him maintain at the present dosing regimen. Make sure he's scheduled to see me within the next several weeks.

## 2011-10-14 ENCOUNTER — Other Ambulatory Visit: Payer: Self-pay

## 2011-10-14 MED ORDER — INSULIN GLARGINE 100 UNIT/ML ~~LOC~~ SOLN: 30.0000 [IU] | Freq: Every day | SUBCUTANEOUS | Status: DC

## 2011-10-14 NOTE — Telephone Encounter (Signed)
Pt picked up pen.

## 2011-10-21 ENCOUNTER — Ambulatory Visit (INDEPENDENT_AMBULATORY_CARE_PROVIDER_SITE_OTHER): Payer: Managed Care, Other (non HMO) | Admitting: Family Medicine

## 2011-10-21 DIAGNOSIS — E119 Type 2 diabetes mellitus without complications: Secondary | ICD-10-CM

## 2011-10-21 NOTE — Patient Instructions (Signed)
Reduce your insulin to 30 units for the next several days and continue to check your blood sugar in the morning. If it remains 120 or lower go ahead and lower it by  another 3 or 4 units for several days. Keep doing this by 3 units every 3 days as long as your blood sugar stays below 120

## 2011-10-21 NOTE — Progress Notes (Signed)
  Subjective:    Patient ID: Micheal Leblanc, male    DOB: 1950/08/25, 61 y.o.   MRN: [id number]  HPI He is here for recheck. His morning blood sugars are now running in the low 100 range. He is having no present difficulty.   Review of Systems     Objective:   Physical Exam Alert and in no distress otherwise not examined.      Assessment & Plan:   1. Diabetes mellitus   Reduce your insulin to 30 units for the next several days and continue to check your blood sugar in the morning. If it remains 120 or lower go ahead and lower it by  another 3 or 4 units for several days. Keep doing this by 3 units every 3 days as long as your blood sugar stays below 120 He also brought in FMLA forms in. I explained that we do not normally fill out FMLA for diabetes. I did fill it out explaining that he would not need to take time off from work. The form was copied and kept in his record. He is to return here in one month for recheck.

## 2011-11-23 ENCOUNTER — Ambulatory Visit (INDEPENDENT_AMBULATORY_CARE_PROVIDER_SITE_OTHER): Payer: Managed Care, Other (non HMO) | Admitting: Family Medicine

## 2011-11-23 ENCOUNTER — Encounter: Payer: Self-pay | Admitting: Family Medicine

## 2011-11-23 DIAGNOSIS — Z9119 Patient's noncompliance with other medical treatment and regimen: Secondary | ICD-10-CM

## 2011-11-23 DIAGNOSIS — E119 Type 2 diabetes mellitus without complications: Secondary | ICD-10-CM

## 2011-11-23 NOTE — Progress Notes (Signed)
  Subjective:    Patient ID: Micheal Leblanc, male    DOB: 05-29-1951, 61 y.o.   MRN: [id number]  HPI He is here for recheck. Since last being seen he has been using his insulin every other day or per upon questioning he cannot give me a good reason why he is doing it this way. Apparently his blood sugars no matter what they have never been above 130.   Review of Systems     Objective:   Physical Exam Heart and in no distress otherwise not damage.      Assessment & Plan:   1. Diabetes mellitus   2. Personal history of noncompliance with medical treatment, presenting hazards to health    I explained to him in no uncertain terms of the way he was handling this was very inappropriate in potentially quite dangerous. I will have him stop his insulin completely since his blood sugars are now below 130. He he will keep track of them on a daily basis especially in the morning and if the blood sugars go above 130, he is to call me. Otherwise I will see him in 3 months.

## 2011-11-23 NOTE — Patient Instructions (Signed)
Stop your insulin completely. Check your blood sugar every morning. If it starts to go up above 130, I want you to call me. Let us see you in 3 months

## 2011-12-23 ENCOUNTER — Ambulatory Visit: Payer: Managed Care, Other (non HMO) | Admitting: Family Medicine

## 2011-12-28 ENCOUNTER — Ambulatory Visit: Payer: Managed Care, Other (non HMO) | Admitting: Family Medicine

## 2012-01-10 ENCOUNTER — Encounter: Payer: Self-pay | Admitting: Gastroenterology

## 2012-02-07 ENCOUNTER — Other Ambulatory Visit (INDEPENDENT_AMBULATORY_CARE_PROVIDER_SITE_OTHER): Payer: Managed Care, Other (non HMO)

## 2012-02-07 DIAGNOSIS — Z23 Encounter for immunization: Secondary | ICD-10-CM

## 2012-02-21 ENCOUNTER — Ambulatory Visit (INDEPENDENT_AMBULATORY_CARE_PROVIDER_SITE_OTHER): Payer: Managed Care, Other (non HMO) | Admitting: Family Medicine

## 2012-02-21 ENCOUNTER — Encounter: Payer: Self-pay | Admitting: Family Medicine

## 2012-02-21 VITALS — BP 124/80 | HR 76 | Temp 98.2°F | Wt 178.0 lb

## 2012-02-21 DIAGNOSIS — J209 Acute bronchitis, unspecified: Secondary | ICD-10-CM

## 2012-02-21 DIAGNOSIS — J019 Acute sinusitis, unspecified: Secondary | ICD-10-CM

## 2012-02-21 DIAGNOSIS — F17200 Nicotine dependence, unspecified, uncomplicated: Secondary | ICD-10-CM

## 2012-02-21 MED ORDER — AMOXICILLIN 875 MG PO TABS: 875.0000 mg | ORAL_TABLET | Freq: Two times a day (BID) | ORAL | Status: AC

## 2012-02-21 NOTE — Patient Instructions (Addendum)
Take all the antibiotic and if you're not totally back to normal call me for refill Listen to your body in terms of when you need to return to work. When you are ready to quit smoking, let me know and I will work with you.

## 2012-02-21 NOTE — Progress Notes (Signed)
  Subjective:    Patient ID: Micheal Leblanc, male    DOB: Jan 23, 1951, 61 y.o.   MRN: [id number]  HPI He has a one-week history of sinus and chest congestion followed by a cough,PND. The symptoms worsened over the weekend with malaise ,worsening sinus congestion he developed fever yesterday and continues today . No sore throat, earache. He continues to smoke and is not quite ready to quit.   Review of Systems     Objective:   Physical Exam alert and in no distress. Tympanic membranes and canals are normal. Throat is clear. Tonsils are normal. Neck is supple without adenopathy or thyromegaly. Cardiac exam shows a regular sinus rhythm without murmurs or gallops. Lungs are clear to auscultation. Nasal mucosa is normal with no tenderness over sinuses.        Assessment & Plan:   1. Smoker    2. Acute bronchitis  amoxicillin (AMOXIL) 875 MG tablet  3. Acute sinusitis  amoxicillin (AMOXIL) 875 MG tablet  ake all the antibiotic and if you're not totally back to normal call me for refill Listen to your body in terms of when you need to return to work. When you are ready to quit smoking, let me know and I will work with you.

## 2012-02-24 ENCOUNTER — Ambulatory Visit: Payer: Managed Care, Other (non HMO) | Admitting: Family Medicine

## 2012-03-24 ENCOUNTER — Telehealth: Payer: Self-pay | Admitting: Family Medicine

## 2012-03-28 ENCOUNTER — Telehealth: Payer: Self-pay | Admitting: Internal Medicine

## 2012-03-28 ENCOUNTER — Other Ambulatory Visit: Payer: Self-pay | Admitting: Family Medicine

## 2012-03-28 ENCOUNTER — Telehealth: Payer: Self-pay | Admitting: Medical

## 2012-03-28 MED ORDER — ATORVASTATIN CALCIUM 20 MG PO TABS: 20.0000 mg | ORAL_TABLET | Freq: Every day | ORAL | Status: DC

## 2012-03-28 NOTE — Telephone Encounter (Signed)
PT B/P MEDS HAVE BEEN RENEWED

## 2012-03-28 NOTE — Telephone Encounter (Signed)
Sent in med

## 2012-03-28 NOTE — Telephone Encounter (Signed)
DR.LALONDE HAS REFILLED PT B/P TODAY

## 2012-03-29 NOTE — Telephone Encounter (Signed)
DR. Susann Givens IS GOING TO HANDLE THESE REFILLS. CLS

## 2012-07-13 ENCOUNTER — Other Ambulatory Visit: Payer: Self-pay | Admitting: Family Medicine

## 2012-09-04 ENCOUNTER — Other Ambulatory Visit: Payer: Self-pay | Admitting: Family Medicine

## 2012-11-06 ENCOUNTER — Other Ambulatory Visit: Payer: Self-pay | Admitting: Family Medicine

## 2013-01-02 ENCOUNTER — Other Ambulatory Visit: Payer: Self-pay | Admitting: Family Medicine

## 2013-02-18 ENCOUNTER — Other Ambulatory Visit: Payer: Self-pay | Admitting: Family Medicine

## 2013-04-21 ENCOUNTER — Other Ambulatory Visit: Payer: Self-pay | Admitting: Family Medicine

## 2013-05-02 ENCOUNTER — Other Ambulatory Visit: Payer: Self-pay | Admitting: Family Medicine

## 2013-10-18 ENCOUNTER — Other Ambulatory Visit: Payer: Self-pay | Admitting: Family Medicine

## 2013-10-23 ENCOUNTER — Ambulatory Visit (INDEPENDENT_AMBULATORY_CARE_PROVIDER_SITE_OTHER): Payer: BC Managed Care – PPO | Admitting: Family Medicine

## 2013-10-23 ENCOUNTER — Encounter: Payer: Self-pay | Admitting: Family Medicine

## 2013-10-23 VITALS — BP 130/80 | HR 76 | Wt 181.0 lb

## 2013-10-23 DIAGNOSIS — I10 Essential (primary) hypertension: Secondary | ICD-10-CM

## 2013-10-23 DIAGNOSIS — Z79899 Other long term (current) drug therapy: Secondary | ICD-10-CM

## 2013-10-23 DIAGNOSIS — F17200 Nicotine dependence, unspecified, uncomplicated: Secondary | ICD-10-CM

## 2013-10-23 DIAGNOSIS — E291 Testicular hypofunction: Secondary | ICD-10-CM

## 2013-10-23 DIAGNOSIS — E119 Type 2 diabetes mellitus without complications: Secondary | ICD-10-CM

## 2013-10-23 LAB — CBC WITH DIFFERENTIAL/PLATELET
Basophils Absolute: 0 10*3/uL (ref 0.0–0.1)
Basophils Relative: 0 % (ref 0–1)
EOS ABS: 0.2 10*3/uL (ref 0.0–0.7)
Eosinophils Relative: 4 % (ref 0–5)
HCT: 42.5 % (ref 39.0–52.0)
Hemoglobin: 14.4 g/dL (ref 13.0–17.0)
LYMPHS ABS: 2.7 10*3/uL (ref 0.7–4.0)
LYMPHS PCT: 49 % — AB (ref 12–46)
MCH: 28.2 pg (ref 26.0–34.0)
MCHC: 33.9 g/dL (ref 30.0–36.0)
MCV: 83.3 fL (ref 78.0–100.0)
Monocytes Absolute: 0.4 10*3/uL (ref 0.1–1.0)
Monocytes Relative: 8 % (ref 3–12)
NEUTROS ABS: 2.2 10*3/uL (ref 1.7–7.7)
NEUTROS PCT: 39 % — AB (ref 43–77)
PLATELETS: 138 10*3/uL — AB (ref 150–400)
RBC: 5.1 MIL/uL (ref 4.22–5.81)
RDW: 14.1 % (ref 11.5–15.5)
WBC: 5.6 10*3/uL (ref 4.0–10.5)

## 2013-10-23 LAB — POCT GLYCOSYLATED HEMOGLOBIN (HGB A1C)

## 2013-10-23 MED ORDER — METFORMIN HCL 1000 MG PO TABS: 1000.0000 mg | ORAL_TABLET | Freq: Two times a day (BID) | ORAL | Status: DC

## 2013-10-23 NOTE — Progress Notes (Signed)
   Subjective:    Patient ID: Micheal Leblanc, male    DOB: [birth year], 63 y.o.   MRN: [id number]  HPI  The patient is doing well overall and has no acute complaints today, though he would like to discuss his finger nails. The paitent has experienced thickened and dry finger and toenails that are now occasionally cracking. He has had this same thing occur int he past and was treated successfully with an anti-fungal treatment.   The patient checks his blood sugars around once a week normally first thing in the morning when he wakes up. They are generally around 119-130.   In terms of diet, the patient reports that his diet has been going poorly, he reports that he eats what he wants. His goal for his next visit is to eat salads 3 times a week with minimal dressing. He also reports that he doesn't exercise much but is very active during the day.    The patient smokes 1/4 ppd, and drinks alcohol occasionally and on the weekends 2-3 drinks. The patient reports no new lesions, burning or tingling in his feet or legs, and had his most recent eye check was in January.   Review of Systems is negative except per HPI.    Objective:   Physical Exam  Constitutional: Patient is oriented to person, place, and time and well-developed, well-nourished, and in no distress. Eyes: Conjunctivae and EOM are normal. Pupils are equal, round, and reactive to light.  Cardiovascular: Normal rate, regular rhythm and intact distal pulses. Pulmonary/Chest: Effort normal and breath sounds normal.  Neurological: Vibratory, positional and monofilament sensation normal in feet bilaterally. Nails: Normal appearing finger nails without cracking, thickening or change in color or texture. Extremities: No obvious lesions  HgA1c: 7.4    Assessment & Plan:   Type II or unspecified type diabetes mellitus without mention of complication, not stated as uncontrolled - Plan: POCT glycosylated hemoglobin (Hb A1C)  Diabetes  mellitus  HTN (hypertension)  Smoker  Hypogonadism, male  Encounter for long-term (current) use of other medications  Given his elevated HgA1c and other risk factors, we will increase his metformin dosage to bid, 1 gram.   The patient was counseled extensively on the importance of diet and exercise as well as quitting smoking.  The patients fingernails appear within normal limits and we will therefore hold off on treatment for the time being.  He was also encouraged to come back more frequently.

## 2013-10-24 LAB — COMPREHENSIVE METABOLIC PANEL
ALBUMIN: 4.1 g/dL (ref 3.5–5.2)
ALK PHOS: 104 U/L (ref 39–117)
ALT: 27 U/L (ref 0–53)
AST: 22 U/L (ref 0–37)
BUN: 12 mg/dL (ref 6–23)
CO2: 25 mEq/L (ref 19–32)
Calcium: 9.1 mg/dL (ref 8.4–10.5)
Chloride: 101 mEq/L (ref 96–112)
Creat: 0.98 mg/dL (ref 0.50–1.35)
Glucose, Bld: 266 mg/dL — ABNORMAL HIGH (ref 70–99)
POTASSIUM: 3.8 meq/L (ref 3.5–5.3)
SODIUM: 137 meq/L (ref 135–145)
Total Bilirubin: 0.4 mg/dL (ref 0.2–1.2)
Total Protein: 6.5 g/dL (ref 6.0–8.3)

## 2013-10-24 LAB — LIPID PANEL
Cholesterol: 131 mg/dL (ref 0–200)
HDL: 34 mg/dL — AB (ref >39)
LDL CALC: 40 mg/dL (ref 0–99)
Total CHOL/HDL Ratio: 3.9 Ratio
Triglycerides: 286 mg/dL — ABNORMAL HIGH (ref <150)
VLDL: 57 mg/dL — ABNORMAL HIGH (ref 0–40)

## 2014-01-09 ENCOUNTER — Other Ambulatory Visit: Payer: Self-pay | Admitting: Family Medicine

## 2014-02-21 ENCOUNTER — Ambulatory Visit: Payer: BC Managed Care – PPO | Admitting: Family Medicine

## 2014-02-22 ENCOUNTER — Ambulatory Visit: Payer: BC Managed Care – PPO | Admitting: Family Medicine

## 2014-02-25 ENCOUNTER — Ambulatory Visit: Payer: BC Managed Care – PPO | Admitting: Family Medicine

## 2014-03-14 ENCOUNTER — Ambulatory Visit: Payer: BC Managed Care – PPO | Admitting: Family Medicine

## 2014-04-17 ENCOUNTER — Encounter: Payer: Self-pay | Admitting: Family Medicine

## 2014-04-17 ENCOUNTER — Ambulatory Visit (INDEPENDENT_AMBULATORY_CARE_PROVIDER_SITE_OTHER): Payer: BC Managed Care – PPO | Admitting: Family Medicine

## 2014-04-17 VITALS — BP 122/70 | HR 75 | Wt 177.0 lb

## 2014-04-17 DIAGNOSIS — Z23 Encounter for immunization: Secondary | ICD-10-CM

## 2014-04-17 DIAGNOSIS — E1159 Type 2 diabetes mellitus with other circulatory complications: Secondary | ICD-10-CM

## 2014-04-17 DIAGNOSIS — E785 Hyperlipidemia, unspecified: Secondary | ICD-10-CM

## 2014-04-17 DIAGNOSIS — I152 Hypertension secondary to endocrine disorders: Secondary | ICD-10-CM

## 2014-04-17 DIAGNOSIS — I10 Essential (primary) hypertension: Secondary | ICD-10-CM

## 2014-04-17 DIAGNOSIS — E1169 Type 2 diabetes mellitus with other specified complication: Secondary | ICD-10-CM

## 2014-04-17 DIAGNOSIS — E119 Type 2 diabetes mellitus without complications: Secondary | ICD-10-CM

## 2014-04-17 DIAGNOSIS — Z86010 Personal history of colonic polyps: Secondary | ICD-10-CM

## 2014-04-17 LAB — POCT GLYCOSYLATED HEMOGLOBIN (HGB A1C): HEMOGLOBIN A1C: 6.6

## 2014-04-17 NOTE — Progress Notes (Signed)
  Subjective:    Micheal Leblanc is a 63 y.o. male who presents for follow-up of Type 2 diabetes mellitus.  Review his record indicates he does have a history of colonic polyps but did not followup with his last colonoscopy. This will be set up.  Home blood sugar records: Patient test b/s one time a day 120 -130 am and in pm 200's  Current symptoms/problems none Daily foot checks:  Any foot concerns: none Last eye exam: Dr.brewington 06/2013   Medication compliance:good Current diet:  Trying to eat less watching carbs Current exercise: none Known diabetic complications: none Cardiovascular risk factors: advanced age (older than 28 for men, 32 for women), diabetes mellitus, dyslipidemia, hypertension, male gender and smoking/ tobacco exposure   The following portions of the patient's history were reviewed and updated as appropriate: allergies, current medications, past family history, past medical history, past social history and problem list.  ROS as in subjective above    Objective:   General appearence: alert, no distress, WD/WN   Lab Review Lab Results  Component Value Date   HGBA1C 7.4% 10/23/2013   Lab Results  Component Value Date   CHOL 131 10/23/2013   HDL 34* 10/23/2013   LDLCALC 40 10/23/2013   TRIG 286* 10/23/2013   CHOLHDL 3.9 10/23/2013   No results found for this basenameDerl Barrow     Chemistry      Component Value Date/Time   NA 137 10/23/2013 1711   K 3.8 10/23/2013 1711   CL 101 10/23/2013 1711   CO2 25 10/23/2013 1711   BUN 12 10/23/2013 1711   CREATININE 0.98 10/23/2013 1711      Component Value Date/Time   CALCIUM 9.1 10/23/2013 1711   ALKPHOS 104 10/23/2013 1711   AST 22 10/23/2013 1711   ALT 27 10/23/2013 1711   BILITOT 0.4 10/23/2013 1711        Chemistry      Component Value Date/Time   NA 137 10/23/2013 1711   K 3.8 10/23/2013 1711   CL 101 10/23/2013 1711   CO2 25 10/23/2013 1711   BUN 12 10/23/2013 1711   CREATININE 0.98 10/23/2013  1711      Component Value Date/Time   CALCIUM 9.1 10/23/2013 1711   ALKPHOS 104 10/23/2013 1711   AST 22 10/23/2013 1711   ALT 27 10/23/2013 1711   BILITOT 0.4 10/23/2013 1711    Hemoglobin A1c is 6.6 His immunizations were reviewed. He will need a Pneumovax and hepatitis B. Assessment:  History of colonic polyps - Plan: Ambulatory referral to Gastroenterology  Need for prophylactic vaccination against Streptococcus pneumoniae (pneumococcus) - Plan: Pneumococcal conjugate vaccine 13-valent  Type 2 diabetes mellitus without complication - Plan: POCT glycosylated hemoglobin (Hb A1C)  Hyperlipidemia associated with type 2 diabetes mellitus  Hypertension complicating diabetes  Need for prophylactic vaccination and inoculation against viral hepatitis - Plan: Hepatitis B vaccine adult IM        Plan:    1.  Rx changes: none 2.  Education: Reviewed 'ABCs' of diabetes management (respective goals in parentheses):  A1C (<7), blood pressure (<130/80), and cholesterol (LDL <100). 3.  Compliance at present is estimated to be good. Efforts to improve compliance (if necessary) will be directed at No change. 4. Follow up: 4 months  I complimented him on the good work that he is doing.

## 2014-04-18 ENCOUNTER — Encounter: Payer: Self-pay | Admitting: Gastroenterology

## 2014-05-29 ENCOUNTER — Other Ambulatory Visit: Payer: Self-pay | Admitting: Family Medicine

## 2014-06-05 ENCOUNTER — Ambulatory Visit (AMBULATORY_SURGERY_CENTER): Payer: Self-pay | Admitting: *Deleted

## 2014-06-05 VITALS — Ht 71.0 in | Wt 180.6 lb

## 2014-06-05 DIAGNOSIS — Z86010 Personal history of colonic polyps: Secondary | ICD-10-CM

## 2014-06-05 MED ORDER — MOVIPREP 100 G PO SOLR: 1.0000 | Freq: Once | ORAL | Status: DC

## 2014-06-05 NOTE — Progress Notes (Signed)
No egg or soy allergy. ewm No issues with past sedation. ewm No home 02 use. ewm No diet pills. ewm emmi video to e mail. ewm

## 2014-06-19 ENCOUNTER — Ambulatory Visit (AMBULATORY_SURGERY_CENTER): Payer: BC Managed Care – PPO | Admitting: Gastroenterology

## 2014-06-19 ENCOUNTER — Encounter: Payer: Self-pay | Admitting: Gastroenterology

## 2014-06-19 VITALS — BP 130/74 | HR 61 | Temp 96.0°F | Resp 15 | Ht 71.0 in | Wt 180.0 lb

## 2014-06-19 DIAGNOSIS — D124 Benign neoplasm of descending colon: Secondary | ICD-10-CM

## 2014-06-19 DIAGNOSIS — D123 Benign neoplasm of transverse colon: Secondary | ICD-10-CM

## 2014-06-19 DIAGNOSIS — K5730 Diverticulosis of large intestine without perforation or abscess without bleeding: Secondary | ICD-10-CM

## 2014-06-19 DIAGNOSIS — Z86010 Personal history of colonic polyps: Secondary | ICD-10-CM

## 2014-06-19 MED ORDER — SODIUM CHLORIDE 0.9 % IV SOLN: 500.0000 mL | INTRAVENOUS | Status: DC

## 2014-06-19 NOTE — Op Note (Signed)
[place]  [address] [place], [id number]   COLONOSCOPY PROCEDURE REPORT  PATIENT: Micheal Leblanc, Micheal Leblanc  MR#: [id number] BIRTHDATE: 04-13-1951 , 62  yrs. old GENDER: male ENDOSCOPIST: Milus Banister, MD PROCEDURE DATE:  06/19/2014 PROCEDURE:   Colonoscopy with snare polypectomy First Screening Colonoscopy - Avg.  risk and is 50 yrs.  old or older - No.  Prior Negative Screening - Now for repeat screening. N/A  History of Adenoma - Now for follow-up colonoscopy & has been > or = to 3 yrs.  Yes hx of adenoma.  Has been 3 or more years since last colonoscopy.  Polyps Removed Today? Yes. ASA CLASS:   Class II INDICATIONS:TVA removed 2007 Colonoscopy, Dr.  Ardis Hughs; recommended 3 year recall examination. MEDICATIONS: Monitored anesthesia care and Propofol 230 mg IV  DESCRIPTION OF PROCEDURE:   After the risks benefits and alternatives of the procedure were thoroughly explained, informed consent was obtained.  The digital rectal exam revealed no abnormalities of the rectum.   The LB [id number]  endoscope was introduced through the anus and advanced to the cecum, which was identified by both the appendix and ileocecal valve. No adverse events experienced.   The quality of the prep was excellent.  The instrument was then slowly withdrawn as the colon was fully examined.  COLON FINDINGS: Three polyps were found, removed and sent to pathology.  Two of them were sessile, located in transverse segment, 3-8mm across, removed with cold snare (jar 1).  One was pedunculated, located in descending segment, 9mm across, removed with snare, cautery (jar 1).  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  There were medium sized internal and external hemorrhoids.  Retroflexed views revealed no abnormalities. The time to cecum=3 minutes 08 seconds.  Withdrawal time=9 minutes 57 seconds.  The scope was withdrawn and the procedure completed. COMPLICATIONS: There were no  immediate complications.  ENDOSCOPIC IMPRESSION: Three polyps were found, removed and sent to pathology. 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 There were medium sized internal and external hemorrhoids  RECOMMENDATIONS: If the polyp(s) removed today are proven to be adenomatous (pre-cancerous) polyps, you will need a colonoscopy in 3-5 years. You will receive a letter within 1-2 weeks with the results of your biopsy as well as final recommendations.  Please call my office if you have not received a letter after 3 weeks.  eSigned:  Milus Banister, MD 06/19/2014 9:15 AM   cc: Jill Alexanders, MD

## 2014-06-19 NOTE — Progress Notes (Signed)
Report to PACU, RN, vss, BBS= Clear.  

## 2014-06-19 NOTE — Patient Instructions (Signed)
YOU HAD AN ENDOSCOPIC PROCEDURE TODAY AT THE [facility]: Refer to the procedure report that was given to you for any specific questions about what was found during the examination.  If the procedure report does not answer your questions, please call your gastroenterologist to clarify.  If you requested that your care partner not be given the details of your procedure findings, then the procedure report has been included in a sealed envelope for you to review at your convenience later.  YOU SHOULD EXPECT: Some feelings of bloating in the abdomen. Passage of more gas than usual.  Walking can help get rid of the air that was put into your GI tract during the procedure and reduce the bloating. If you had a lower endoscopy (such as a colonoscopy or flexible sigmoidoscopy) you may notice spotting of blood in your stool or on the toilet paper. If you underwent a bowel prep for your procedure, then you may not have a normal bowel movement for a few days.  DIET: Your first meal following the procedure should be a light meal and then it is ok to progress to your normal diet.  A half-sandwich or bowl of soup is an example of a good first meal.  Heavy or fried foods are harder to digest and may make you feel nauseous or bloated.  Likewise meals heavy in dairy and vegetables can cause extra gas to form and this can also increase the bloating.  Drink plenty of fluids but you should avoid alcoholic beverages for 24 hours.  ACTIVITY: Your care partner should take you home directly after the procedure.  You should plan to take it easy, moving slowly for the rest of the day.  You can resume normal activity the day after the procedure however you should NOT DRIVE or use heavy machinery for 24 hours (because of the sedation medicines used during the test).    SYMPTOMS TO REPORT IMMEDIATELY: A gastroenterologist can be reached at any hour.  During normal business hours, 8:30 AM to 5:00 PM Monday through Friday,  call [phone].  After hours and on weekends, please call the GI answering service at [phone] who will take a message and have the physician on call contact you.   Following lower endoscopy (colonoscopy or flexible sigmoidoscopy):  Excessive amounts of blood in the stool  Significant tenderness or worsening of abdominal pains  Swelling of the abdomen that is new, acute  Fever of 100F or higher   FOLLOW UP: If any biopsies were taken you will be contacted by phone or by letter within the next 1-3 weeks.  Call your gastroenterologist if you have not heard about the biopsies in 3 weeks.  Our staff will call the home number listed on your records the next business day following your procedure to check on you and address any questions or concerns that you may have at that time regarding the information given to you following your procedure. This is a courtesy call and so if there is no answer at the home number and we have not heard from you through the emergency physician on call, we will assume that you have returned to your regular daily activities without incident.  SIGNATURES/CONFIDENTIALITY: You and/or your care partner have signed paperwork which will be entered into your electronic medical record.  These signatures attest to the fact that that the information above on your After Visit Summary has been reviewed and is understood.  Full responsibility of the confidentiality of   this discharge information lies with you and/or your care-partner.    Resume medications. Information given on polyps, diverticulosis,hemorrhoids and high fiber diet with discharge instructions. 

## 2014-06-19 NOTE — Progress Notes (Signed)
Called to room to assist during endoscopic procedure.  Patient ID and intended procedure confirmed with present staff. Received instructions for my participation in the procedure from the performing physician.  

## 2014-06-24 ENCOUNTER — Telehealth: Payer: Self-pay | Admitting: *Deleted

## 2014-06-24 NOTE — Telephone Encounter (Signed)
  Follow up Call-  Call back number 06/19/2014  Post procedure Call Back phone  # [phone]  Permission to leave phone message Yes     Patient questions:  Do you have a fever, pain , or abdominal swelling? No. Pain Score  0 *  Have you tolerated food without any problems? Yes.    Have you been able to return to your normal activities? Yes.    Do you have any questions about your discharge instructions: Diet   No. Medications  No. Follow up visit  No.  Do you have questions or concerns about your Care? No.  Actions: * If pain score is 4 or above: No action needed, pain <4.

## 2014-06-26 ENCOUNTER — Encounter: Payer: Self-pay | Admitting: Gastroenterology

## 2014-07-30 ENCOUNTER — Other Ambulatory Visit: Payer: Self-pay | Admitting: Family Medicine

## 2014-08-20 ENCOUNTER — Ambulatory Visit: Payer: BC Managed Care – PPO | Admitting: Family Medicine

## 2014-08-27 ENCOUNTER — Ambulatory Visit: Payer: Self-pay | Admitting: Family Medicine

## 2014-08-29 ENCOUNTER — Ambulatory Visit (INDEPENDENT_AMBULATORY_CARE_PROVIDER_SITE_OTHER): Payer: BLUE CROSS/BLUE SHIELD | Admitting: Family Medicine

## 2014-08-29 ENCOUNTER — Encounter: Payer: Self-pay | Admitting: Family Medicine

## 2014-08-29 ENCOUNTER — Other Ambulatory Visit: Payer: Self-pay

## 2014-08-29 VITALS — BP 140/90 | HR 76 | Wt 178.0 lb

## 2014-08-29 DIAGNOSIS — I10 Essential (primary) hypertension: Secondary | ICD-10-CM

## 2014-08-29 DIAGNOSIS — E291 Testicular hypofunction: Secondary | ICD-10-CM

## 2014-08-29 DIAGNOSIS — M799 Soft tissue disorder, unspecified: Secondary | ICD-10-CM

## 2014-08-29 DIAGNOSIS — K635 Polyp of colon: Secondary | ICD-10-CM

## 2014-08-29 DIAGNOSIS — E119 Type 2 diabetes mellitus without complications: Secondary | ICD-10-CM

## 2014-08-29 DIAGNOSIS — E785 Hyperlipidemia, unspecified: Secondary | ICD-10-CM | POA: Diagnosis not present

## 2014-08-29 DIAGNOSIS — E1159 Type 2 diabetes mellitus with other circulatory complications: Secondary | ICD-10-CM

## 2014-08-29 DIAGNOSIS — Z720 Tobacco use: Secondary | ICD-10-CM

## 2014-08-29 DIAGNOSIS — E1169 Type 2 diabetes mellitus with other specified complication: Secondary | ICD-10-CM | POA: Diagnosis not present

## 2014-08-29 DIAGNOSIS — F17200 Nicotine dependence, unspecified, uncomplicated: Secondary | ICD-10-CM

## 2014-08-29 LAB — COMPREHENSIVE METABOLIC PANEL
ALBUMIN: 4.3 g/dL (ref 3.5–5.2)
ALK PHOS: 70 U/L (ref 39–117)
ALT: 24 U/L (ref 0–53)
AST: 20 U/L (ref 0–37)
BUN: 18 mg/dL (ref 6–23)
CO2: 25 mEq/L (ref 19–32)
Calcium: 9.6 mg/dL (ref 8.4–10.5)
Chloride: 108 mEq/L (ref 96–112)
Creat: 1.07 mg/dL (ref 0.50–1.35)
Glucose, Bld: 87 mg/dL (ref 70–99)
POTASSIUM: 3.9 meq/L (ref 3.5–5.3)
Sodium: 143 mEq/L (ref 135–145)
TOTAL PROTEIN: 6.9 g/dL (ref 6.0–8.3)
Total Bilirubin: 0.7 mg/dL (ref 0.2–1.2)

## 2014-08-29 LAB — LIPID PANEL
CHOL/HDL RATIO: 2.9 ratio
CHOLESTEROL: 97 mg/dL (ref 0–200)
HDL: 34 mg/dL — AB (ref >=40)
LDL CALC: 25 mg/dL (ref 0–99)
TRIGLYCERIDES: 189 mg/dL — AB (ref <150)
VLDL: 38 mg/dL (ref 0–40)

## 2014-08-29 LAB — POCT GLYCOSYLATED HEMOGLOBIN (HGB A1C): HEMOGLOBIN A1C: 6.5

## 2014-08-29 LAB — TESTOSTERONE: Testosterone: 171 ng/dL — ABNORMAL LOW (ref 300–890)

## 2014-08-29 MED ORDER — OLMESARTAN-AMLODIPINE-HCTZ 40-5-25 MG PO TABS: 1.0000 | ORAL_TABLET | Freq: Every day | ORAL | Status: DC

## 2014-08-29 MED ORDER — ATORVASTATIN CALCIUM 20 MG PO TABS: 20.0000 mg | ORAL_TABLET | Freq: Every day | ORAL | Status: DC

## 2014-08-29 MED ORDER — METFORMIN HCL ER 750 MG PO TB24: 750.0000 mg | ORAL_TABLET | Freq: Two times a day (BID) | ORAL | Status: DC

## 2014-08-29 MED ORDER — TESTOSTERONE 20.25 MG/ACT (1.62%) TD GEL: 2.0000 | Freq: Every day | TRANSDERMAL | Status: DC

## 2014-08-29 NOTE — Progress Notes (Signed)
  Subjective:    Patient ID: Micheal Leblanc, male    DOB: 1951/04/04, 64 y.o.   MRN: [id number]  Micheal Leblanc is a 64 y.o. male who presents for follow-up of Type 2 diabetes mellitus. He states that he is having 3 or 4 loose bowel movements per day and had this had this is being placed on metformin. He has never mentioned this in the past thinking it was normal. He now would like a note for work since it is interfering with work. Also has a lesion in his left axilla that he would like evaluated. He has a history of hypogonadism but has not been on testosterone due to cost.  Home blood sugar records: Patients test one time a day Current symptoms/problems Patient has lose BM at work at least 3 times a day Daily foot checks:   Any foot concerns:  no Exercise: walking EYE:Dr.Walden around this time last year The following portions of the patient's history were reviewed and updated as appropriate: allergies, current medications, past medical history, past social history and problem list.  ROS as in subjective above.     Objective:    Physical Exam Alert and in no distress ; small cystic lesion noted in the left axilla. It is not hot, red or tender.  Lab Review Diabetic Labs Latest Ref Rng 04/17/2014 10/23/2013 08/26/2011 04/20/2011 01/08/2011  HbA1c - 6.6 7.4% 8.1 - 6.4%  Chol 0 - 200 mg/dL - 131 109 - 116  HDL >39 mg/dL - 34(L) 38(L) - 40  Calc LDL 0 - 99 mg/dL - 40 44 - 56  Triglycerides <150 mg/dL - 286(H) 134 - 100  Creatinine 0.50 - 1.35 mg/dL - 0.98 0.99 1.21 1.15   BP/Weight 06/19/2014 06/05/2014 04/17/2014 10/23/2013 0/56/9794  Systolic BP 801 - 655 374 827  Diastolic BP 74 - 70 80 80  Wt. (Lbs) 180 180.6 177 181 178  BMI 25.12 25.2 24.7 25.26 24.84    Micheal Leblanc  reports that he has been smoking.  He has never used smokeless tobacco. He reports that he drinks about 2.4 oz of alcohol per week. He reports that he does not use illicit drugs. He smokes about 4 cigarettes per  day Hemoglobin A1c is 6.5    Assessment & Plan:    Encounter Diagnoses  Name Primary?  . Hypertension complicating diabetes Yes  . Hyperlipidemia associated with type 2 diabetes mellitus   . Type 2 diabetes mellitus without complication   . Colonic polyp   . Hypogonadism, male   . Lesion of soft tissue   . Smoker     1. Rx changes: Reduce the metformin to 750 twice a day 2. Education: Reviewed 'ABCs' of diabetes management (respective goals in parentheses):  A1C (<7), blood pressure (<130/80), and cholesterol (LDL <100). 3. Compliance at present is estimated to be excellent. Efforts to improve compliance (if necessary) will be directed at increased exercise. 4. Follow up: 4 months  5. He will call if continued difficulty with diarrhea. I will also place him back on testosterone.

## 2014-08-30 ENCOUNTER — Other Ambulatory Visit: Payer: Self-pay

## 2014-08-30 LAB — CBC WITH DIFFERENTIAL/PLATELET
BASOS PCT: 0 % (ref 0–1)
Basophils Absolute: 0 10*3/uL (ref 0.0–0.1)
EOS ABS: 0.2 10*3/uL (ref 0.0–0.7)
Eosinophils Relative: 3 % (ref 0–5)
HEMATOCRIT: 45.6 % (ref 39.0–52.0)
Hemoglobin: 15.7 g/dL (ref 13.0–17.0)
Lymphocytes Relative: 37 % (ref 12–46)
Lymphs Abs: 2.5 10*3/uL (ref 0.7–4.0)
MCH: 29.1 pg (ref 26.0–34.0)
MCHC: 34.4 g/dL (ref 30.0–36.0)
MCV: 84.4 fL (ref 78.0–100.0)
MONOS PCT: 10 % (ref 3–12)
MPV: 12.8 fL — ABNORMAL HIGH (ref 8.6–12.4)
Monocytes Absolute: 0.7 10*3/uL (ref 0.1–1.0)
NEUTROS PCT: 50 % (ref 43–77)
Neutro Abs: 3.4 10*3/uL (ref 1.7–7.7)
PLATELETS: 143 10*3/uL — AB (ref 150–400)
RBC: 5.4 MIL/uL (ref 4.22–5.81)
RDW: 14.1 % (ref 11.5–15.5)
WBC: 6.8 10*3/uL (ref 4.0–10.5)

## 2014-08-30 LAB — PSA: PSA: 0.55 ng/mL (ref <=4.00)

## 2014-09-06 ENCOUNTER — Telehealth: Payer: Self-pay | Admitting: Family Medicine

## 2014-09-06 NOTE — Telephone Encounter (Signed)
Pt called and stated that the new dose of metformin is working well. He states he is doing much better.

## 2014-09-13 ENCOUNTER — Telehealth: Payer: Self-pay | Admitting: Family Medicine

## 2014-09-17 NOTE — Telephone Encounter (Signed)
P.A. Approved til 06/27/38, faxed pharmacy, Left message for pt

## 2014-10-11 ENCOUNTER — Ambulatory Visit: Payer: BLUE CROSS/BLUE SHIELD | Admitting: Family Medicine

## 2014-10-15 ENCOUNTER — Ambulatory Visit: Payer: BLUE CROSS/BLUE SHIELD | Admitting: Family Medicine

## 2014-10-23 ENCOUNTER — Encounter: Payer: Self-pay | Admitting: Family Medicine

## 2014-10-23 ENCOUNTER — Ambulatory Visit (INDEPENDENT_AMBULATORY_CARE_PROVIDER_SITE_OTHER): Payer: BLUE CROSS/BLUE SHIELD | Admitting: Family Medicine

## 2014-10-23 VITALS — BP 120/70 | HR 72 | Wt 178.0 lb

## 2014-10-23 DIAGNOSIS — E291 Testicular hypofunction: Secondary | ICD-10-CM | POA: Diagnosis not present

## 2014-10-23 DIAGNOSIS — E119 Type 2 diabetes mellitus without complications: Secondary | ICD-10-CM

## 2014-10-23 NOTE — Progress Notes (Signed)
   Subjective:    Patient ID: Micheal Leblanc, male    DOB: [birth year], 64 y.o.   MRN: [id number]  HPI He is here for recheck. Since cutting down on the metformin dosing, his bowel habits have returned to normal. He states his blood sugars are running between 101 120. He did not start on testosterone stating in spite of me giving him a discount card, they said it would cost him $300. He did not follow-up with me at that point concerning possibly using a different replacement medication.   Review of Systems     Objective:   Physical Exam Alert and in no distress otherwise not examined       Assessment & Plan:  Type 2 diabetes mellitus without complication  Hypogonadism, male Encouraged him to continue with his present diet and exercise regimen. We will reorder the testosterone. Strongly encouraged him to work with the pharmacy to find out if they will truly cover that medicine and if not check with the pharmacy or his insurance company to see which testosterone preparation they will cover and call me.

## 2014-10-23 NOTE — Patient Instructions (Signed)
Check with your insurance, need to find out what testosterone preparation they will cover and let me know. If they don't cover anything and I can call in and get some made for you at around $50-$75 a month. When she on the medication we'll check you again in the month

## 2015-02-13 ENCOUNTER — Ambulatory Visit: Payer: BLUE CROSS/BLUE SHIELD | Admitting: Family Medicine

## 2015-02-25 ENCOUNTER — Ambulatory Visit (INDEPENDENT_AMBULATORY_CARE_PROVIDER_SITE_OTHER): Payer: BLUE CROSS/BLUE SHIELD | Admitting: Family Medicine

## 2015-02-25 ENCOUNTER — Encounter: Payer: Self-pay | Admitting: Family Medicine

## 2015-02-25 VITALS — BP 120/80 | HR 66 | Ht 71.0 in | Wt 177.0 lb

## 2015-02-25 DIAGNOSIS — F17200 Nicotine dependence, unspecified, uncomplicated: Secondary | ICD-10-CM

## 2015-02-25 DIAGNOSIS — E119 Type 2 diabetes mellitus without complications: Secondary | ICD-10-CM

## 2015-02-25 DIAGNOSIS — Z720 Tobacco use: Secondary | ICD-10-CM

## 2015-02-25 DIAGNOSIS — E1169 Type 2 diabetes mellitus with other specified complication: Secondary | ICD-10-CM | POA: Diagnosis not present

## 2015-02-25 DIAGNOSIS — E291 Testicular hypofunction: Secondary | ICD-10-CM | POA: Diagnosis not present

## 2015-02-25 DIAGNOSIS — I10 Essential (primary) hypertension: Secondary | ICD-10-CM

## 2015-02-25 DIAGNOSIS — K635 Polyp of colon: Secondary | ICD-10-CM | POA: Diagnosis not present

## 2015-02-25 DIAGNOSIS — E1159 Type 2 diabetes mellitus with other circulatory complications: Secondary | ICD-10-CM

## 2015-02-25 DIAGNOSIS — E785 Hyperlipidemia, unspecified: Secondary | ICD-10-CM

## 2015-02-25 LAB — POCT GLYCOSYLATED HEMOGLOBIN (HGB A1C): Hemoglobin A1C: 7.9

## 2015-02-25 MED ORDER — METFORMIN HCL ER 750 MG PO TB24: 750.0000 mg | ORAL_TABLET | Freq: Two times a day (BID) | ORAL | Status: DC

## 2015-02-25 NOTE — Progress Notes (Signed)
  Subjective:    Patient ID: Micheal Leblanc, male    DOB: 09-07-1950, 64 y.o.   MRN: [id number]  CARLES FLOREA is a 64 y.o. male who presents for follow-up of Type 2 diabetes mellitus.He does have upper respiratory symptoms today mainly of cough and congestion but no fever or chills. He also quit smoking approximately 9 days ago when the URI symptoms started. He continues on testosterone and is having no difficulty with that.  Home blood sugar records: Patient check two times a weekIn the morning when he wakes up. Current symptoms/problems none Daily foot checks:yes   Any foot concerns: none Exercise: walking  Eyes: 2015 every two years The following portions of the patient's history were reviewed and updated as appropriate: allergies, current medications, past medical history, past social history and problem list.  ROS as in subjective above.     Objective:    Physical Exam Alert and in no distress otherwise not examined.   Lab Review Diabetic Labs Latest Ref Rng 08/29/2014 04/17/2014 10/23/2013 08/26/2011 04/20/2011  HbA1c - 6.5 6.6 7.4% 8.1 -  Chol 0 - 200 mg/dL 97 - 131 109 -  HDL >=40 mg/dL 34(L) - 34(L) 38(L) -  Calc LDL 0 - 99 mg/dL 25 - 40 44 -  Triglycerides <150 mg/dL 189(H) - 286(H) 134 -  Creatinine 0.50 - 1.35 mg/dL 1.07 - 0.98 0.99 1.21   BP/Weight 10/23/2014 08/29/2014 06/19/2014 06/05/2014 81/44/8185  Systolic BP 631 497 026 - 378  Diastolic BP 70 90 74 - 70  Wt. (Lbs) 178 178 180 180.6 177  BMI 24.84 24.84 25.12 25.2 24.7   HbA1c 7.9  Hakim  reports that he has been smoking.  He has never used smokeless tobacco. He reports that he drinks about 2.4 oz of alcohol per week. He reports that he does not use illicit drugs.     Assessment & Plan:    Smoker  Hypertension complicating diabetes  Type 2 diabetes mellitus without complication - Plan: POCT glycosylated hemoglobin (Hb A1C), metFORMIN (GLUCOPHAGE-XR) 750 MG 24 hr tablet  Hyperlipidemia associated with  type 2 diabetes mellitus  Colonic polyp  Hypogonadism, male - Plan: Testosterone    1. Rx changes: none 2. Education: Reviewed 'ABCs' of diabetes management (respective goals in parentheses):  A1C (<7), blood pressure (<130/80), and cholesterol (LDL <100). 3. Compliance at present is estimated to be fair. Efforts to improve compliance (if necessary) will be directed at encouraged him to check his blood sugars 2 hours after meals and if elevated, make dietary adjustments.. 4. Follow up: 4 months Shot was offered but he declined. Also recommend he get a yearly eye exam. Also discussed smoking cessation with him. Explained that at this point he is no longer addicted to nicotine but does need to work on the habit of smoking. Gave him suggestions concerning positive things to do instead of smoking and recommend he return here if he has difficulty with that.

## 2015-02-25 NOTE — Patient Instructions (Addendum)
Make a list of things to do instead of smoking. Look at the places and situations that you smoke in and have a plan. She have a problem, and I will talk Start checking your blood sugars 2 hours after meals. if they're above the 180 you need to make adjustments. If you have questions or problems come in Use Robitussin-DM during the day and NyQuil at night. If you still have these symptoms another week or so come back

## 2015-03-13 ENCOUNTER — Telehealth: Payer: Self-pay | Admitting: Family Medicine

## 2015-03-13 MED ORDER — GLUCOSE BLOOD VI STRP: 1.0000 | ORAL_STRIP | Freq: Two times a day (BID) | Status: DC

## 2015-03-13 NOTE — Telephone Encounter (Signed)
done

## 2015-03-13 NOTE — Telephone Encounter (Signed)
Pt called and was wanting a refill for his  glucose test strips [facility] [address] pt can be reached at [phone] (M)

## 2015-03-27 ENCOUNTER — Encounter: Payer: Self-pay | Admitting: Family Medicine

## 2015-03-27 ENCOUNTER — Ambulatory Visit (INDEPENDENT_AMBULATORY_CARE_PROVIDER_SITE_OTHER): Payer: BLUE CROSS/BLUE SHIELD | Admitting: Family Medicine

## 2015-03-27 VITALS — BP 112/68 | HR 62 | Resp 12 | Wt 171.4 lb

## 2015-03-27 DIAGNOSIS — E119 Type 2 diabetes mellitus without complications: Secondary | ICD-10-CM | POA: Diagnosis not present

## 2015-03-27 MED ORDER — INSULIN GLARGINE 100 UNIT/ML SOLOSTAR PEN: 10.0000 [IU] | PEN_INJECTOR | Freq: Every day | SUBCUTANEOUS | Status: DC

## 2015-03-27 NOTE — Progress Notes (Signed)
   Subjective:    Patient ID: Micheal Leblanc, male    DOB: March 05, 1951, 64 y.o.   MRN: [id number]  HPI He is here for consult. He has noted since his last visit that his blood sugars have been slowly going higher and higher crit he continues on his metformin. Recently he has seen a number in the 500 range. Prior to this they were in the 300s. He has no polyuria or polydipsia. He was on Lantus in the past however did quite nicely off of it for a significant period of time.   Review of Systems     Objective:   Physical Exam Alert and in no distress otherwise not examined ice suspect that the honeymoon is over.       Assessment & Plan:  Type 2 diabetes mellitus without complication - Plan: Insulin Glargine (LANTUS SOLOSTAR) 100 UNIT/ML Solostar Pen  I suspect that the honeymoon is over. I will start him back on Lantus at 10 units. He will increase this by 2 units every 2 days until his morning but sugars under 120. He understands this and will return here in 2 weeks for recheck.

## 2015-03-27 NOTE — Patient Instructions (Signed)
Your metformin. Start the Lantus at 10 units every morning. Increase it by 2 units every 2 days until your blood sugar is under 120 in the morning.

## 2015-04-17 ENCOUNTER — Ambulatory Visit (INDEPENDENT_AMBULATORY_CARE_PROVIDER_SITE_OTHER): Payer: BLUE CROSS/BLUE SHIELD | Admitting: Family Medicine

## 2015-04-17 ENCOUNTER — Encounter: Payer: Self-pay | Admitting: Family Medicine

## 2015-04-17 VITALS — BP 120/80 | Ht 71.0 in | Wt 173.0 lb

## 2015-04-17 DIAGNOSIS — Z794 Long term (current) use of insulin: Secondary | ICD-10-CM

## 2015-04-17 DIAGNOSIS — E119 Type 2 diabetes mellitus without complications: Secondary | ICD-10-CM

## 2015-04-17 NOTE — Progress Notes (Deleted)
  Subjective:    Patient ID: CALAB SACHSE, male    DOB: 1951/01/19, 64 y.o.   MRN: [id number]  Micheal Leblanc is a 64 y.o. male who presents for follow-up of Type 2 diabetes mellitus.  Home blood sugar records: BID  Current symptoms/problem: frequent urine with urgency  Daily foot checks:yes   Any foot concerns: *** needles poking in toes Exercise: walking  Eye: 06/2015 The following portions of the patient's history were reviewed and updated as appropriate: allergies, current medications, past medical history, past social history and problem list.  ROS as in subjective above.     Objective:    Physical Exam Alert and in no distress otherwise not examined.  There were no vitals taken for this visit.  Lab Review Diabetic Labs Latest Ref Rng 02/25/2015 08/29/2014 04/17/2014 10/23/2013 08/26/2011  HbA1c - 7.9 6.5 6.6 7.4% 8.1  Chol 0 - 200 mg/dL - 97 - 131 109  HDL >=40 mg/dL - 34(L) - 34(L) 38(L)  Calc LDL 0 - 99 mg/dL - 25 - 40 44  Triglycerides <150 mg/dL - 189(H) - 286(H) 134  Creatinine 0.50 - 1.35 mg/dL - 1.07 - 0.98 0.99   BP/Weight 03/27/2015 02/25/2015 10/23/2014 08/29/2014 46/28/6381  Systolic BP 771 165 790 383 338  Diastolic BP 68 80 70 90 74  Wt. (Lbs) 171.4 177 178 178 180  BMI 23.92 24.7 24.84 24.84 25.12   No flowsheet data found.  Adron  reports that he has been smoking.  He has never used smokeless tobacco. He reports that he drinks about 2.4 oz of alcohol per week. He reports that he does not use illicit drugs.     Assessment & Plan:    No diagnosis found.  1. Rx changes: {none:33079} 2. Education: Reviewed 'ABCs' of diabetes management (respective goals in parentheses):  A1C (<7), blood pressure (<130/80), and cholesterol (LDL <100). 3. Compliance at present is estimated to be {good/fair/poor:33178}. Efforts to improve compliance (if necessary) will be directed at {compliance:16716}. 4. Follow up: {NUMBERS; 0-10:33138} {time:11}

## 2015-04-17 NOTE — Progress Notes (Signed)
   Subjective:    Patient ID: Micheal Leblanc, male    DOB: [birth year], 64 y.o.   MRN: [id number]  HPI He is here for a recheck. He is now on 20 units of insulin and states that his last blood sugar was down to 280. He states that has slowly been coming down since last being seen. Occasional polyuria.   Review of Systems     Objective:   Physical Exam Alert and in no distress otherwise not examined       Assessment & Plan:  Type 2 diabetes mellitus without complication, with long-term current use of insulin (HCC)  he will continue to increase his insulin dosing until his blood sugars under 120. He states at that time he will then call me. Otherwise I will see him back here in one month.

## 2015-05-20 ENCOUNTER — Ambulatory Visit (INDEPENDENT_AMBULATORY_CARE_PROVIDER_SITE_OTHER): Payer: BLUE CROSS/BLUE SHIELD | Admitting: Family Medicine

## 2015-05-20 VITALS — BP 120/80 | HR 76 | Ht 71.0 in | Wt 176.0 lb

## 2015-05-20 DIAGNOSIS — Z794 Long term (current) use of insulin: Secondary | ICD-10-CM | POA: Diagnosis not present

## 2015-05-20 DIAGNOSIS — E118 Type 2 diabetes mellitus with unspecified complications: Secondary | ICD-10-CM

## 2015-05-20 DIAGNOSIS — E1165 Type 2 diabetes mellitus with hyperglycemia: Secondary | ICD-10-CM | POA: Diagnosis not present

## 2015-05-20 DIAGNOSIS — IMO0002 Reserved for concepts with insufficient information to code with codable children: Secondary | ICD-10-CM

## 2015-05-20 MED ORDER — INSULIN GLARGINE 100 UNIT/ML SOLOSTAR PEN: 40.0000 [IU] | PEN_INJECTOR | Freq: Every day | SUBCUTANEOUS | Status: DC

## 2015-05-20 MED ORDER — GLUCOSE BLOOD VI STRP: 1.0000 | ORAL_STRIP | Freq: Two times a day (BID) | Status: DC

## 2015-05-20 MED ORDER — DAPAGLIFLOZIN PROPANEDIOL 10 MG PO TABS: 10.0000 mg | ORAL_TABLET | Freq: Every day | ORAL | Status: DC

## 2015-05-20 NOTE — Patient Instructions (Addendum)
Check  your sugar before you give the insulin. Also check it 2 hours after one of your meals

## 2015-05-20 NOTE — Progress Notes (Signed)
   Subjective:    Patient ID: [name], male    DOB: 01-24-1951, 64 y.o.   MRN: [id number]  HPI He is here for a recheck. He is now on 34 units of insulin. His blood sugars in the morning are running in the low 200s. He does not check at any other time. He admits to dietary indiscretion. He has been through the nutrition classes.   Review of Systems     Objective:   Physical Exam  Alert and in no distress. Hemoglobin A1c is 12.1      Assessment & Plan:  Uncontrolled type 2 diabetes mellitus with complication, with long-term current use of insulin (HCC) - Plan: dapagliflozin propanediol (FARXIGA) 10 MG TABS tablet, Insulin Glargine (LANTUS SOLOSTAR) 100 UNIT/ML Solostar Pen, glucose blood test strip  he will continue to monitor his morning blood sugar and I also recommend that he check them 2 hours after meals to give him immediate feedback. I'll also place him on Farxiga in an attempt to help lower his sugars even more. Recheck here one month.

## 2015-05-21 ENCOUNTER — Ambulatory Visit: Payer: BLUE CROSS/BLUE SHIELD | Admitting: Family Medicine

## 2015-06-19 ENCOUNTER — Ambulatory Visit (INDEPENDENT_AMBULATORY_CARE_PROVIDER_SITE_OTHER): Payer: BLUE CROSS/BLUE SHIELD | Admitting: Family Medicine

## 2015-06-19 VITALS — BP 110/70 | HR 81 | Wt 178.0 lb

## 2015-06-19 DIAGNOSIS — Z794 Long term (current) use of insulin: Secondary | ICD-10-CM

## 2015-06-19 DIAGNOSIS — E119 Type 2 diabetes mellitus without complications: Secondary | ICD-10-CM

## 2015-06-19 LAB — POCT GLYCOSYLATED HEMOGLOBIN (HGB A1C)
HEMOGLOBIN A1C: 12.1
Hemoglobin A1C: 8.8

## 2015-06-19 NOTE — Progress Notes (Signed)
   Subjective:    Patient ID: Micheal Leblanc, male    DOB: [birth year], 64 y.o.   MRN: [id number]  HPI He is here for a recheck. He is now on Farxiga 10 mg and 38 units of insulin. He states that in the last 10 days, his blood sugars have come down. In the morning they're in the 120-130 range. After meals he is under 180.   Review of Systems     Objective:   Physical Exam Alert and in no distress. Hemoglobin A1c is 8.8       Assessment & Plan:  Type 2 diabetes mellitus without complication, with long-term current use of insulin ([employer])  it looks as if he really is sticking to his program. I congratulated him and I will recheck him in 4 months.

## 2015-06-19 NOTE — Addendum Note (Signed)
Addended by: Billie Lade on: 06/19/2015 04:36 PM   Modules accepted: Orders

## 2015-07-15 ENCOUNTER — Ambulatory Visit: Payer: BLUE CROSS/BLUE SHIELD | Admitting: Family Medicine

## 2015-09-30 ENCOUNTER — Telehealth: Payer: Self-pay | Admitting: Family Medicine

## 2015-09-30 DIAGNOSIS — IMO0002 Reserved for concepts with insufficient information to code with codable children: Secondary | ICD-10-CM

## 2015-09-30 DIAGNOSIS — E118 Type 2 diabetes mellitus with unspecified complications: Principal | ICD-10-CM

## 2015-09-30 DIAGNOSIS — Z794 Long term (current) use of insulin: Principal | ICD-10-CM

## 2015-09-30 DIAGNOSIS — E1165 Type 2 diabetes mellitus with hyperglycemia: Secondary | ICD-10-CM

## 2015-09-30 NOTE — Telephone Encounter (Signed)
Pt requesting needles for his Lantus. He has been buying over the counter but want a script sent in so he can get this through his insurance

## 2015-10-01 MED ORDER — PEN NEEDLES 31G X 8 MM MISC: Status: DC

## 2015-10-01 NOTE — Telephone Encounter (Signed)
Sent in med to pharmacy 

## 2015-10-22 ENCOUNTER — Encounter: Payer: Self-pay | Admitting: Family Medicine

## 2015-10-22 ENCOUNTER — Ambulatory Visit (INDEPENDENT_AMBULATORY_CARE_PROVIDER_SITE_OTHER): Payer: BLUE CROSS/BLUE SHIELD | Admitting: Family Medicine

## 2015-10-22 VITALS — BP 120/70 | HR 69 | Ht 71.0 in | Wt 180.0 lb

## 2015-10-22 DIAGNOSIS — Z9119 Patient's noncompliance with other medical treatment and regimen: Secondary | ICD-10-CM | POA: Diagnosis not present

## 2015-10-22 DIAGNOSIS — IMO0002 Reserved for concepts with insufficient information to code with codable children: Secondary | ICD-10-CM

## 2015-10-22 DIAGNOSIS — F17200 Nicotine dependence, unspecified, uncomplicated: Secondary | ICD-10-CM

## 2015-10-22 DIAGNOSIS — Z794 Long term (current) use of insulin: Secondary | ICD-10-CM

## 2015-10-22 DIAGNOSIS — E118 Type 2 diabetes mellitus with unspecified complications: Secondary | ICD-10-CM | POA: Diagnosis not present

## 2015-10-22 DIAGNOSIS — E1165 Type 2 diabetes mellitus with hyperglycemia: Secondary | ICD-10-CM

## 2015-10-22 DIAGNOSIS — E785 Hyperlipidemia, unspecified: Secondary | ICD-10-CM | POA: Diagnosis not present

## 2015-10-22 DIAGNOSIS — E1169 Type 2 diabetes mellitus with other specified complication: Secondary | ICD-10-CM | POA: Diagnosis not present

## 2015-10-22 DIAGNOSIS — E1159 Type 2 diabetes mellitus with other circulatory complications: Secondary | ICD-10-CM | POA: Diagnosis not present

## 2015-10-22 DIAGNOSIS — I152 Hypertension secondary to endocrine disorders: Secondary | ICD-10-CM

## 2015-10-22 DIAGNOSIS — I10 Essential (primary) hypertension: Secondary | ICD-10-CM

## 2015-10-22 DIAGNOSIS — Z23 Encounter for immunization: Secondary | ICD-10-CM | POA: Diagnosis not present

## 2015-10-22 DIAGNOSIS — Z720 Tobacco use: Secondary | ICD-10-CM

## 2015-10-22 DIAGNOSIS — E291 Testicular hypofunction: Secondary | ICD-10-CM | POA: Diagnosis not present

## 2015-10-22 DIAGNOSIS — Z91199 Patient's noncompliance with other medical treatment and regimen due to unspecified reason: Secondary | ICD-10-CM

## 2015-10-22 LAB — CBC WITH DIFFERENTIAL/PLATELET
BASOS ABS: 0 {cells}/uL (ref 0–200)
BASOS PCT: 0 %
EOS ABS: 248 {cells}/uL (ref 15–500)
Eosinophils Relative: 4 %
HEMATOCRIT: 44.8 % (ref 38.5–50.0)
Hemoglobin: 14.9 g/dL (ref 13.2–17.1)
Lymphocytes Relative: 44 %
Lymphs Abs: 2728 cells/uL (ref 850–3900)
MCH: 27.8 pg (ref 27.0–33.0)
MCHC: 33.3 g/dL (ref 32.0–36.0)
MCV: 83.6 fL (ref 80.0–100.0)
MONO ABS: 558 {cells}/uL (ref 200–950)
MPV: 13 fL — AB (ref 7.5–12.5)
Monocytes Relative: 9 %
NEUTROS ABS: 2666 {cells}/uL (ref 1500–7800)
Neutrophils Relative %: 43 %
Platelets: 134 10*3/uL — ABNORMAL LOW (ref 140–400)
RBC: 5.36 MIL/uL (ref 4.20–5.80)
RDW: 14.5 % (ref 11.0–15.0)
WBC: 6.2 10*3/uL (ref 4.0–10.5)

## 2015-10-22 LAB — LIPID PANEL
CHOL/HDL RATIO: 2.8 ratio (ref <=5.0)
CHOLESTEROL: 93 mg/dL — AB (ref 125–200)
HDL: 33 mg/dL — AB (ref >=40)
LDL Cholesterol: 26 mg/dL (ref <130)
TRIGLYCERIDES: 171 mg/dL — AB (ref <150)
VLDL: 34 mg/dL — ABNORMAL HIGH (ref <30)

## 2015-10-22 LAB — COMPREHENSIVE METABOLIC PANEL
ALBUMIN: 3.8 g/dL (ref 3.6–5.1)
ALK PHOS: 75 U/L (ref 40–115)
ALT: 29 U/L (ref 9–46)
AST: 20 U/L (ref 10–35)
BUN: 23 mg/dL (ref 7–25)
CALCIUM: 9.5 mg/dL (ref 8.6–10.3)
CO2: 25 mmol/L (ref 20–31)
Chloride: 105 mmol/L (ref 98–110)
Creat: 1.2 mg/dL (ref 0.70–1.25)
Glucose, Bld: 194 mg/dL — ABNORMAL HIGH (ref 65–99)
POTASSIUM: 3.7 mmol/L (ref 3.5–5.3)
Sodium: 140 mmol/L (ref 135–146)
TOTAL PROTEIN: 6.8 g/dL (ref 6.1–8.1)
Total Bilirubin: 0.6 mg/dL (ref 0.2–1.2)

## 2015-10-22 LAB — POCT GLYCOSYLATED HEMOGLOBIN (HGB A1C): HEMOGLOBIN A1C: 9.4

## 2015-10-22 LAB — POCT UA - MICROALBUMIN
ALBUMIN/CREATININE RATIO, URINE, POC: 10.7
CREATININE, POC: 88.2 mg/dL
Microalbumin Ur, POC: 9.4 mg/L

## 2015-10-22 MED ORDER — INSULIN GLARGINE 100 UNIT/ML SOLOSTAR PEN: 40.0000 [IU] | PEN_INJECTOR | Freq: Every day | SUBCUTANEOUS | Status: DC

## 2015-10-22 MED ORDER — OLMESARTAN-AMLODIPINE-HCTZ 40-5-25 MG PO TABS: 1.0000 | ORAL_TABLET | Freq: Every day | ORAL | Status: DC

## 2015-10-22 MED ORDER — PEN NEEDLES 31G X 8 MM MISC: Status: DC

## 2015-10-22 MED ORDER — METFORMIN HCL ER 750 MG PO TB24
750.0000 mg | ORAL_TABLET | Freq: Two times a day (BID) | ORAL | Status: DC
Start: 2015-10-22 — End: 2018-01-09

## 2015-10-22 MED ORDER — ATORVASTATIN CALCIUM 20 MG PO TABS: 20.0000 mg | ORAL_TABLET | Freq: Every day | ORAL | Status: DC

## 2015-10-22 MED ORDER — GLUCOSE BLOOD VI STRP
1.0000 | ORAL_STRIP | Freq: Two times a day (BID) | Status: DC
Start: 2015-10-22 — End: 2015-12-15

## 2015-10-22 MED ORDER — DAPAGLIFLOZIN PROPANEDIOL 10 MG PO TABS: 10.0000 mg | ORAL_TABLET | Freq: Every day | ORAL | Status: DC

## 2015-10-22 NOTE — Progress Notes (Signed)
  Subjective:    Patient ID: Micheal Leblanc, male    DOB: June 10, 1951, 65 y.o.   MRN: [id number]  Micheal Leblanc is a 65 y.o. male who presents for follow-up of Type 2 diabetes mellitus.  Patient is checking home blood sugars.   Home blood sugar records: 175 high 109 lowgrade he does check them before eating but the after eating number can be at any time. How often is blood sugars being checked:several times per week. Current symptoms/problems  Urinating alot. Daily foot checks: yes  Any foot concerns: none Last eye exam: 2015 Exercise: walking His eating habits are sporadic.he states work interferes with his He still smokes but states he is cutting back. Eating habits. He stopped taking testosterone due to cost. The following portions of the patient's history were reviewed and updated as appropriate: allergies, current medications, past medical history, past social history and problem list.  ROS as in subjective above.     Objective:    Physical Exam Alert and in no distress otherwise not examined. Exam is normal. Hemoglobin A1c is 9.4  Lab Review Diabetic Labs Latest Ref Rng 06/19/2015 05/20/2015 02/25/2015 08/29/2014 04/17/2014  HbA1c - 8.8 12.1 7.9 6.5 6.6  Chol 0 - 200 mg/dL - - - 97 -  HDL >=40 mg/dL - - - 34(L) -  Calc LDL 0 - 99 mg/dL - - - 25 -  Triglycerides <150 mg/dL - - - 189(H) -  Creatinine 0.50 - 1.35 mg/dL - - - 1.07 -   BP/Weight 06/19/2015 05/20/2015 04/17/2015 03/27/2015 [id number]  Systolic BP A999333 123456 123456 [id number] 123456  Diastolic BP 70 80 80 68 80  Wt. (Lbs) 178 176 173 171.4 177  BMI 24.84 24.56 24.14 23.92 24.7    Micheal Leblanc  reports that he has been smoking.  He has never used smokeless tobacco. He reports that he drinks about 2.4 oz of alcohol per week. He reports that he does not use illicit drugs.     Assessment & Plan:    Uncontrolled type 2 diabetes mellitus with complication, with long-term current use of insulin ([employer]) - Plan: POCT glycosylated  hemoglobin (Hb A1C), POCT UA - Microalbumin  Hypertension complicating diabetes ([place])  Hyperlipidemia associated with type 2 diabetes mellitus ([place])  Hypogonadism, male  Need for prophylactic vaccination with combined diphtheria-tetanus-pertussis (DTP) vaccine  Need for prophylactic vaccination against Streptococcus pneumoniae (pneumococcus)  Personal history of noncompliance with medical treatment, presenting hazards to health  Smoker   1. Rx changes: none 2. Education: Reviewed 'ABCs' of diabetes management (respective goals in parentheses):  A1C (<7), blood pressure (<130/80), and cholesterol (LDL <100). 3. Compliance at present is estimated to be poor. Efforts to improve compliance will be directed atthe risk of his noncompliance causing CNS, ocular, cardiac, renal and neurologic damage.cking his morning blood sugar and getting the number down to under 120. Also encouraged him to check it 2 hours after meals. He seems to be checking them randomly. I again explained the need to do it at particular times. Also discussed the need for him to eat more regular meals. Also discussed the need for him to quit smoking. 4. Follow up: 4 months

## 2015-10-23 ENCOUNTER — Other Ambulatory Visit: Payer: Self-pay | Admitting: Family Medicine

## 2015-12-15 ENCOUNTER — Other Ambulatory Visit: Payer: Self-pay | Admitting: Family Medicine

## 2016-02-12 LAB — HM DIABETES EYE EXAM

## 2016-02-23 ENCOUNTER — Ambulatory Visit: Payer: BLUE CROSS/BLUE SHIELD | Admitting: Family Medicine

## 2016-04-20 ENCOUNTER — Ambulatory Visit (INDEPENDENT_AMBULATORY_CARE_PROVIDER_SITE_OTHER): Payer: BLUE CROSS/BLUE SHIELD | Admitting: Family Medicine

## 2016-04-20 ENCOUNTER — Encounter: Payer: Self-pay | Admitting: Family Medicine

## 2016-04-20 VITALS — BP 120/80 | HR 74 | Ht 71.0 in | Wt 178.0 lb

## 2016-04-20 DIAGNOSIS — E1165 Type 2 diabetes mellitus with hyperglycemia: Secondary | ICD-10-CM | POA: Diagnosis not present

## 2016-04-20 DIAGNOSIS — E118 Type 2 diabetes mellitus with unspecified complications: Secondary | ICD-10-CM | POA: Diagnosis not present

## 2016-04-20 DIAGNOSIS — E1169 Type 2 diabetes mellitus with other specified complication: Secondary | ICD-10-CM | POA: Diagnosis not present

## 2016-04-20 DIAGNOSIS — IMO0002 Reserved for concepts with insufficient information to code with codable children: Secondary | ICD-10-CM

## 2016-04-20 DIAGNOSIS — F17200 Nicotine dependence, unspecified, uncomplicated: Secondary | ICD-10-CM | POA: Diagnosis not present

## 2016-04-20 DIAGNOSIS — I10 Essential (primary) hypertension: Secondary | ICD-10-CM

## 2016-04-20 DIAGNOSIS — E1159 Type 2 diabetes mellitus with other circulatory complications: Secondary | ICD-10-CM | POA: Diagnosis not present

## 2016-04-20 DIAGNOSIS — Z794 Long term (current) use of insulin: Secondary | ICD-10-CM

## 2016-04-20 DIAGNOSIS — E785 Hyperlipidemia, unspecified: Secondary | ICD-10-CM | POA: Diagnosis not present

## 2016-04-20 LAB — POCT GLYCOSYLATED HEMOGLOBIN (HGB A1C): HEMOGLOBIN A1C: 8.2

## 2016-04-20 NOTE — Progress Notes (Signed)
  Subjective:    Patient ID: Micheal Leblanc, male    DOB: 1951/01/13, 65 y.o.   MRN: [id number]  Micheal Leblanc is a 65 y.o. male who presents for follow-up of Type 2 diabetes mellitus.  Patient is checking home blood sugars.   Home blood sugar records:190 to 110 How often is blood sugars being checked: one time a day. It was difficult for me to get a good history as to when he was exactly checking the blood sugars but I reinforced the need for him to check at either before a meal or 2 hours after a meal. Current symptoms/problems none Daily foot checks: yes  Any foot concerns:none Last eye exam: 8/17 Exercise: walking He continues on metformin and Lantus insulin. He also is taking [place]. He continues on Lipitor as well as his blood pressure medicines and is having no trouble with them. He did not discuss testosterone. He continues to smoke and is apparently cutting back but not quite ready to quit entirely. The following portions of the patient's history were reviewed and updated as appropriate: allergies, current medications, past medical history, past social history and problem list.  ROS as in subjective above.     Objective:    Physical Exam Alert and in no distress otherwise not examined.   Lab Review Diabetic Labs Latest Ref Rng & Units 10/22/2015 06/19/2015 05/20/2015 02/25/2015 08/29/2014  HbA1c - 9.4 8.8 12.1 7.9 6.5  Microalbumin mg/L 9.4 - - - -  Micro/Creat Ratio - 10.7 - - - -  Chol 125 - 200 mg/dL 93(L) - - - 97  HDL >=40 mg/dL 33(L) - - - 34(L)  Calc LDL <130 mg/dL 26 - - - 25  Triglycerides <150 mg/dL 171(H) - - - 189(H)  Creatinine 0.70 - 1.25 mg/dL 1.20 - - - 1.07   BP/Weight 10/22/2015 06/19/2015 05/20/2015 04/17/2015 123456  Systolic BP 123456 A999333 123456 123456 [id number]  Diastolic BP 70 70 80 80 68  Wt. (Lbs) 180 178 176 173 171.4  BMI 25.12 24.84 24.56 24.14 23.92   Foot/eye exam completion dates 10/22/2015  Foot Form Completion Done  A1c 8.2  [name]  reports  that he has been smoking.  He has been smoking about 0.50 packs per day. He has never used smokeless tobacco. He reports that he drinks about 2.4 oz of alcohol per week . He reports that he does not use drugs.     Assessment & Plan:    Hypertension complicating diabetes ([place])  Hyperlipidemia associated with type 2 diabetes mellitus ([place])  Uncontrolled type 2 diabetes mellitus with complication, with long-term current use of insulin ([place])  Smoker   1. Rx changes: I again reinforced the need for him to increase his Lantus insulin by 2 units every 2 days until his blood sugar in the morning is below 120. 2. Education: Reviewed 'ABCs' of diabetes management (respective goals in parentheses):  A1C (<7), blood pressure (<130/80), and cholesterol (LDL <100). 3. Compliance at present is estimated to be fair. Efforts to improve compliance (if necessary) will be directed at increased exercise. His exercise pattern is still questionable. 4. Follow up: 4 months I again reinforced the need for him to quit smoking. Flu shot offered but he refused

## 2016-04-20 NOTE — Patient Instructions (Signed)
Always check your blood sugars either before a meal or 2 hours after a meal. Increase your Lantus insulin by 2 units every 2 days until your morning blood sugar is under 120

## 2016-08-26 ENCOUNTER — Ambulatory Visit: Payer: BLUE CROSS/BLUE SHIELD | Admitting: Family Medicine

## 2017-07-01 ENCOUNTER — Encounter: Payer: Self-pay | Admitting: Gastroenterology

## 2017-08-16 ENCOUNTER — Encounter: Payer: Self-pay | Admitting: Gastroenterology

## 2017-09-24 ENCOUNTER — Ambulatory Visit (HOSPITAL_COMMUNITY)
Admission: EM | Admit: 2017-09-24 | Discharge: 2017-09-24 | Disposition: A | Discharge status: Nursing Home (Includes ICF) | Payer: Medicare Other | Source: Home / Self Care

## 2017-09-24 ENCOUNTER — Emergency Department (HOSPITAL_COMMUNITY)
Admission: EM | Admit: 2017-09-24 | Discharge: 2017-09-24 | Disposition: A | Discharge status: Home / Self Care | Payer: Medicare Other | Attending: Emergency Medicine | Admitting: Emergency Medicine

## 2017-09-24 ENCOUNTER — Encounter (HOSPITAL_COMMUNITY): Payer: Self-pay

## 2017-09-24 DIAGNOSIS — Y999 Unspecified external cause status: Secondary | ICD-10-CM | POA: Diagnosis not present

## 2017-09-24 DIAGNOSIS — Y9289 Other specified places as the place of occurrence of the external cause: Secondary | ICD-10-CM | POA: Insufficient documentation

## 2017-09-24 DIAGNOSIS — T1511XA Foreign body in conjunctival sac, right eye, initial encounter: Secondary | ICD-10-CM | POA: Diagnosis not present

## 2017-09-24 DIAGNOSIS — T65891A Toxic effect of other specified substances, accidental (unintentional), initial encounter: Secondary | ICD-10-CM | POA: Insufficient documentation

## 2017-09-24 DIAGNOSIS — H10211 Acute toxic conjunctivitis, right eye: Secondary | ICD-10-CM

## 2017-09-24 DIAGNOSIS — Y9389 Activity, other specified: Secondary | ICD-10-CM | POA: Insufficient documentation

## 2017-09-24 DIAGNOSIS — F17210 Nicotine dependence, cigarettes, uncomplicated: Secondary | ICD-10-CM | POA: Insufficient documentation

## 2017-09-24 DIAGNOSIS — I10 Essential (primary) hypertension: Secondary | ICD-10-CM | POA: Diagnosis not present

## 2017-09-24 DIAGNOSIS — Z79899 Other long term (current) drug therapy: Secondary | ICD-10-CM | POA: Diagnosis not present

## 2017-09-24 DIAGNOSIS — E119 Type 2 diabetes mellitus without complications: Secondary | ICD-10-CM | POA: Diagnosis not present

## 2017-09-24 DIAGNOSIS — X58XXXA Exposure to other specified factors, initial encounter: Secondary | ICD-10-CM | POA: Insufficient documentation

## 2017-09-24 DIAGNOSIS — Z794 Long term (current) use of insulin: Secondary | ICD-10-CM | POA: Diagnosis not present

## 2017-09-24 DIAGNOSIS — T1501XA Foreign body in cornea, right eye, initial encounter: Secondary | ICD-10-CM | POA: Diagnosis not present

## 2017-09-24 DIAGNOSIS — T2661XA Corrosion of cornea and conjunctival sac, right eye, initial encounter: Secondary | ICD-10-CM | POA: Diagnosis not present

## 2017-09-24 DIAGNOSIS — S058X1A Other injuries of right eye and orbit, initial encounter: Secondary | ICD-10-CM | POA: Diagnosis present

## 2017-09-24 DIAGNOSIS — T1591XA Foreign body on external eye, part unspecified, right eye, initial encounter: Secondary | ICD-10-CM

## 2017-09-24 MED ORDER — ERYTHROMYCIN 5 MG/GM OP OINT
1.0000 "application " | TOPICAL_OINTMENT | Freq: Once | OPHTHALMIC | Status: AC
  Administered 2017-09-24: 1 via OPHTHALMIC
  Filled 2017-09-24: qty 3.5

## 2017-09-24 NOTE — ED Provider Notes (Signed)
[facility] EMERGENCY DEPARTMENT Provider Note   CSN: [id number] Arrival date & time: 09/24/17  1528     History   Chief Complaint No chief complaint on file.   HPI Micheal Leblanc is a 67 y.o. male who presents to the ED for right eye burning and unable to open it after he put superglue in his eye rather than visine. Patient reports being in a hurry and not looking at the bottle.   HPI  Past Medical History:  Diagnosis Date  . Diabetes mellitus   . Diverticulitis   . ED (erectile dysfunction)   . Hx of colonic polyps   . Hyperlipidemia   . Hypertension   . Smoker     Patient Active Problem List   Diagnosis Date Noted  . Hyperlipidemia associated with type 2 diabetes mellitus ([place]) 04/17/2014  . Arthritis 08/26/2011  . Smoker 12/16/2010  . Diverticulosis 12/16/2010  . Colonic polyp 12/16/2010  . 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 Hypertension complicating diabetes ([place]) 12/16/2010  . Diabetes mellitus type 2, uncontrolled, with complications ([place]) 04/21/8526    Past Surgical History:  Procedure Laterality Date  . COLONOSCOPY    . POLYPECTOMY          Home Medications    Prior to Admission medications   Medication Sig Start Date End Date Taking? Authorizing Provider  atorvastatin (LIPITOR) 20 MG tablet Take 1 tablet (20 mg total) by mouth daily. 10/22/15   Denita Lung, MD  dapagliflozin propanediol (FARXIGA) 10 MG TABS tablet Take 10 mg by mouth daily. 10/22/15   Denita Lung, MD  glucose blood (ONE TOUCH ULTRA TEST) test strip Patient is to test BID DX: E11.9 12/15/15   Denita Lung, MD  Insulin Glargine (LANTUS SOLOSTAR) 100 UNIT/ML Solostar Pen Inject 40 Units into the skin daily before breakfast. 10/22/15   Denita Lung, MD  Insulin Pen Needle (PEN NEEDLES) 31G X 8 MM MISC Use this for lantus solorstar pen 10/22/15   Denita Lung, MD  metFORMIN (GLUCOPHAGE-XR) 750 MG 24 hr tablet Take 1 tablet (750 mg total) by mouth 2 (two)  times daily. 10/22/15   Denita Lung, MD  Olmesartan-Amlodipine-HCTZ [facility]) 40-5-25 MG TABS Take 1 tablet by mouth daily. 10/22/15   Denita Lung, MD  Testosterone (ANDROGEL PUMP) 20.25 MG/ACT (1.62%) GEL Place 2 Squirts onto the skin daily. Patient not taking: Reported on 04/20/2016 08/29/14   Denita Lung, MD    Family History Family History  Problem Relation Age of Onset  . Arthritis Mother   . Colon cancer Neg Hx   . Rectal cancer Neg Hx   . Stomach cancer Neg Hx     Social History Social History   Tobacco Use  . Smoking status: Current Every Day Smoker    Packs/day: 0.50  . Smokeless tobacco: Never Used  . Tobacco comment: trying to quit - down to 20 a day   Substance Use Topics  . Alcohol use: Yes    Alcohol/week: 2.4 oz    Types: 2 Cans of beer, 2 Shots of liquor per week  . Drug use: No     Allergies   Patient has no known allergies.   Review of Systems Review of Systems  Eyes: Positive for pain and redness.       Unable to open eye  All other systems reviewed and are negative.    Physical Exam Updated Vital Signs BP (!) 162/110   Pulse  64   Temp 97.7 F (36.5 C) (Oral)   Resp 18   SpO2 98%   Physical Exam  Constitutional: He appears well-developed and well-nourished. No distress.  HENT:  Head: Normocephalic.  Eyes:  Right eye lids glued shut with superglue. Erythema of lid noted.   Neck: Neck supple.  Cardiovascular: Normal rate.  Pulmonary/Chest: Effort normal.  Musculoskeletal: Normal range of motion.  Neurological: He is alert.  Skin: Skin is warm and dry.  Psychiatric: He has a normal mood and affect.  Nursing note and vitals reviewed.    ED Treatments / Results  Labs (all labs ordered are listed, but only abnormal results are displayed) Labs Reviewed - No data to display  EKG None  Radiology No results found.  Procedures Procedures (including critical care time)  Medications Ordered in ED Medications    erythromycin ophthalmic ointment 1 application (1 application Right Eye Given 09/24/17 1624)   Dr. Noel Journey, ophthalmology here to see the patient. He was able to get the eye open and examine the patient with the slit lamp. Patient to use Erythromycin Opth ointment every 3 hours until f/u with [facility] in 2 days. Patient to use artificial tears between use of ointment. Patient agrees with plan.   Initial Impression / Assessment and Plan / ED Course  I have reviewed the triage vital signs and the nursing notes.   Final Clinical Impressions(s) / ED Diagnoses   Final diagnoses:  Chemical conjunctivitis of right eye  Eye foreign bodies, right, initial encounter    ED Discharge Orders    None       Debroah Baller [place], [place] 09/24/17 Kara Pacer, MD 09/25/17 1122

## 2017-09-24 NOTE — ED Triage Notes (Signed)
Patient here with right eye burning and unable to open after super glue applied to eye instead of visine by accident.

## 2017-09-24 NOTE — Discharge Instructions (Signed)
Use the eye ointment every 3 hours until you follow up at [facility] on Monday. Use artificial tears between times.

## 2017-09-26 ENCOUNTER — Other Ambulatory Visit: Payer: Self-pay | Admitting: Ophthalmology

## 2017-09-26 DIAGNOSIS — S0501XA Injury of conjunctiva and corneal abrasion without foreign body, right eye, initial encounter: Secondary | ICD-10-CM | POA: Diagnosis not present

## 2017-09-26 NOTE — Progress Notes (Signed)
I examined pt in the ER after he had glued his OD lids together.    VA: refused by pt OU IOP: refused by pt OU RAPD: refused by pt OU  Exam: LLL: OD - upper and lower lids glued together OS normal C/S: OD - inferior conj glued OS normal K: normal OU AC: DQ OU I: normal OU L: normal OU  DFE: deferred by pt   AP: Upper and lower lids glued together.  These areas were debrided in ER.  Pt able to open eye at that point.  Glue on conj was also debrided leaving a conj abrasion.  Recommend erythromycin ointment Q4H.   Close FU on 09/26/17 with Dr. Manuella Ghazi at [facility].

## 2017-09-30 DIAGNOSIS — S0501XD Injury of conjunctiva and corneal abrasion without foreign body, right eye, subsequent encounter: Secondary | ICD-10-CM | POA: Diagnosis not present

## 2017-10-11 ENCOUNTER — Ambulatory Visit (AMBULATORY_SURGERY_CENTER): Payer: Self-pay | Admitting: *Deleted

## 2017-10-11 ENCOUNTER — Other Ambulatory Visit: Payer: Self-pay

## 2017-10-11 VITALS — Ht 71.0 in | Wt 176.2 lb

## 2017-10-11 DIAGNOSIS — Z860100 Personal history of colon polyps, unspecified: Secondary | ICD-10-CM

## 2017-10-11 NOTE — Progress Notes (Signed)
No egg or soy allergy known to patient  No issues with past sedation with any surgeries  or procedures, no intubation problems  No diet pills per patient No home 02 use per patient  No blood thinners per patient  Pt denies issues with constipation  No A fib or A flutter  EMMI video sent to pt's e mail . Pt Declined  

## 2017-10-12 ENCOUNTER — Encounter: Payer: Self-pay | Admitting: Gastroenterology

## 2017-10-24 ENCOUNTER — Telehealth: Payer: Self-pay | Admitting: Gastroenterology

## 2017-10-24 MED ORDER — NA SULFATE-K SULFATE-MG SULF 17.5-3.13-1.6 GM/177ML PO SOLN: 2.0000 | Freq: Once | ORAL | 0 refills | Status: AC

## 2017-10-24 NOTE — Telephone Encounter (Signed)
This patient called at 530 pm stating that he had gone to his pharmacy at 4pm and there was no prep prescription ready.   His instructions said Suprep, so [facility] [address] via [facility] and told him to get started on it ASAP if he is still hoping to have colonoscopy tomorrow.  He agree to do so.

## 2017-10-25 ENCOUNTER — Encounter: Payer: Medicare Other | Admitting: Gastroenterology

## 2017-10-25 ENCOUNTER — Other Ambulatory Visit: Payer: Self-pay

## 2017-10-25 ENCOUNTER — Encounter: Payer: Self-pay | Admitting: Gastroenterology

## 2017-10-25 ENCOUNTER — Ambulatory Visit (AMBULATORY_SURGERY_CENTER): Payer: Medicare Other | Admitting: Gastroenterology

## 2017-10-25 VITALS — BP 117/87 | HR 61 | Temp 98.6°F | Resp 12 | Ht 71.0 in | Wt 176.0 lb

## 2017-10-25 DIAGNOSIS — K5730 Diverticulosis of large intestine without perforation or abscess without bleeding: Secondary | ICD-10-CM | POA: Diagnosis not present

## 2017-10-25 DIAGNOSIS — D122 Benign neoplasm of ascending colon: Secondary | ICD-10-CM | POA: Diagnosis not present

## 2017-10-25 DIAGNOSIS — Z86010 Personal history of colonic polyps: Secondary | ICD-10-CM | POA: Diagnosis present

## 2017-10-25 DIAGNOSIS — D123 Benign neoplasm of transverse colon: Secondary | ICD-10-CM | POA: Diagnosis not present

## 2017-10-25 MED ORDER — SODIUM CHLORIDE 0.9 % IV SOLN: 500.0000 mL | Freq: Once | INTRAVENOUS | Status: DC

## 2017-10-25 NOTE — Progress Notes (Signed)
Report given to PACU, vss 

## 2017-10-25 NOTE — Patient Instructions (Signed)
YOU HAD AN ENDOSCOPIC PROCEDURE TODAY AT [facility]:   Refer to the procedure report that was given to you for any specific questions about what was found during the examination.  If the procedure report does not answer your questions, please call your gastroenterologist to clarify.  If you requested that your care partner not be given the details of your procedure findings, then the procedure report has been included in a sealed envelope for you to review at your convenience later.  YOU SHOULD EXPECT: Some feelings of bloating in the abdomen. Passage of more gas than usual.  Walking can help get rid of the air that was put into your GI tract during the procedure and reduce the bloating. If you had a lower endoscopy (such as a colonoscopy or flexible sigmoidoscopy) you may notice spotting of blood in your stool or on the toilet paper. If you underwent a bowel prep for your procedure, you may not have a normal bowel movement for a few days.  Please Note:  You might notice some irritation and congestion in your nose or some drainage.  This is from the oxygen used during your procedure.  There is no need for concern and it should clear up in a day or so.  SYMPTOMS TO REPORT IMMEDIATELY:   Following lower endoscopy (colonoscopy or flexible sigmoidoscopy):  Excessive amounts of blood in the stool  Significant tenderness or worsening of abdominal pains  Swelling of the abdomen that is new, acute  Fever of 100F or higher  Please see handouts given to you today on Polyps and Diverticulosis  For urgent or emergent issues, a gastroenterologist can be reached at any hour by calling [phone].   DIET:  We do recommend a small meal at first, but then you may proceed to your regular diet.  Drink plenty of fluids but you should avoid alcoholic beverages for 24 hours.  ACTIVITY:  You should plan to take it easy for the rest of today and you should NOT DRIVE or use heavy machinery until  tomorrow (because of the sedation medicines used during the test).    FOLLOW UP: Our staff will call the number listed on your records the next business day following your procedure to check on you and address any questions or concerns that you may have regarding the information given to you following your procedure. If we do not reach you, we will leave a message.  However, if you are feeling well and you are not experiencing any problems, there is no need to return our call.  We will assume that you have returned to your regular daily activities without incident.  If any biopsies were taken you will be contacted by phone or by letter within the next 1-3 weeks.  Please call us at [phone] if you have not heard about the biopsies in 3 weeks.    SIGNATURES/CONFIDENTIALITY: You and/or your care partner have signed paperwork which will be entered into your electronic medical record.  These signatures attest to the fact that that the information above on your After Visit Summary has been reviewed and is understood.  Full responsibility of the confidentiality of this discharge information lies with you and/or your care-partner.  Thank you for letting us take care of your healthcare needs today.

## 2017-10-25 NOTE — Progress Notes (Signed)
Pt's states no medical or surgical changes since previsit or office visit. 

## 2017-10-25 NOTE — Op Note (Signed)
[place] Patient Name: Micheal Leblanc Procedure Date: 10/25/2017 11:45 AM MRN: [id number] Endoscopist: Milus Banister , MD Age: 67 Referring MD:  Date of Birth: Mar 04, 1951 Gender: Male Account #: [id number] Procedure:                Colonoscopy Indications:              High risk colon cancer surveillance: Personal                            history of colonic polyps; TVA removed during 2007                            Colonoscopy, Dr. Ardis Hughs; Colonoscopy 05/2014 three                            subCM adenomas removed Medicines:                Monitored Anesthesia Care Procedure:                Pre-Anesthesia Assessment:                           - Prior to the procedure, a History and Physical                            was performed, and patient medications and                            allergies were reviewed. The patient's tolerance of                            previous anesthesia was also reviewed. The risks                            and benefits of the procedure and the sedation                            options and risks were discussed with the patient.                            All questions were answered, and informed consent                            was obtained. Prior Anticoagulants: The patient has                            taken no previous anticoagulant or antiplatelet                            agents. ASA Grade Assessment: II - A patient with                            mild systemic disease. After reviewing the risks  and benefits, the patient was deemed in                            satisfactory condition to undergo the procedure.                           After obtaining informed consent, the colonoscope                            was passed under direct vision. Throughout the                            procedure, the patient's blood pressure, pulse, and                            oxygen saturations were monitored  continuously. The                            Model PCF-[id number]) scope was introduced                            through the anus and advanced to the the cecum,                            identified by appendiceal orifice and ileocecal                            valve. The colonoscopy was performed without                            difficulty. The patient tolerated the procedure                            well. The quality of the bowel preparation was                            good. The ileocecal valve, appendiceal orifice, and                            rectum were photographed. Scope In: 11:49:33 AM Scope Out: 11:59:07 AM Scope Withdrawal Time: 0 hours 8 minutes 4 seconds  Total Procedure Duration: 0 hours 9 minutes 34 seconds  Findings:                 Two sessile polyps were found in the transverse                            colon and ascending colon. The polyps were 2 to 3                            mm in size. These polyps were removed with a cold                            snare. Resection and retrieval were  complete.                           Multiple small and large-mouthed diverticula were                            found in the entire colon.                           The exam was otherwise without abnormality on                            direct and retroflexion views. Complications:            No immediate complications. Estimated blood loss:                            None. Estimated Blood Loss:     Estimated blood loss: none. Impression:               - Two 2 to 3 mm polyps in the transverse colon and                            in the ascending colon, removed with a cold snare.                            Resected and retrieved.                           - Diverticulosis in the entire examined colon.                           - The examination was otherwise normal on direct                            and retroflexion views. Recommendation:           - Patient  has a contact number available for                            emergencies. The signs and symptoms of potential                            delayed complications were discussed with the                            patient. Return to normal activities tomorrow.                            Written discharge instructions were provided to the                            patient.                           - Resume previous diet.                           -  Continue present medications.                           You will receive a letter within 2-3 weeks with the                            pathology results and my final recommendations.                           If the polyp(s) is proven to be 'pre-cancerous' on                            pathology, you will need repeat colonoscopy in 5                            years. Milus Banister, MD 10/25/2017 12:02:13 PM This report has been signed electronically.

## 2017-10-25 NOTE — Progress Notes (Signed)
Called to room to assist during endoscopic procedure.  Patient ID and intended procedure confirmed with present staff. Received instructions for my participation in the procedure from the performing physician.  

## 2017-10-26 ENCOUNTER — Telehealth: Payer: Self-pay

## 2017-10-26 ENCOUNTER — Telehealth: Payer: Self-pay | Admitting: *Deleted

## 2017-10-26 NOTE — Telephone Encounter (Signed)
  Follow up Call-  Call back number 10/25/2017  Post procedure Call Back phone  # [phone]  Permission to leave phone message Yes  Some recent data might be hidden     Patient questions:  Do you have a fever, pain , or abdominal swelling? No. Pain Score  0 *  Have you tolerated food without any problems? Yes.    Have you been able to return to your normal activities? Yes.    Do you have any questions about your discharge instructions: Diet   No. Medications  No. Follow up visit  No.  Do you have questions or concerns about your Care? No.  Actions: * If pain score is 4 or above: No action needed, pain <4.

## 2017-10-26 NOTE — Telephone Encounter (Signed)
  Follow up Call-  Call back number 10/25/2017  Post procedure Call Back phone  # [phone]  Permission to leave phone message Yes  Some recent data might be hidden     Left message

## 2017-11-01 ENCOUNTER — Encounter: Payer: Self-pay | Admitting: Gastroenterology

## 2018-01-06 ENCOUNTER — Telehealth: Payer: Self-pay | Admitting: Family Medicine

## 2018-01-06 NOTE — Telephone Encounter (Signed)
Pt called and informed us that his that he was needing refills on his medicines and that his sugar was 290 and that he was watching his sugars, informed him that he was dismissed from our [place] in Jan 2018 and we could not refill his medicine, informed him that he could go the urgent care or the ER to be seen esp for his Blood sugar,

## 2018-01-09 ENCOUNTER — Telehealth (HOSPITAL_COMMUNITY): Payer: Self-pay | Admitting: Family Medicine

## 2018-01-09 ENCOUNTER — Ambulatory Visit (HOSPITAL_COMMUNITY)
Admission: EM | Admit: 2018-01-09 | Discharge: 2018-01-09 | Disposition: A | Discharge status: Home / Self Care | Payer: Medicare Other | Attending: Family Medicine | Admitting: Family Medicine

## 2018-01-09 ENCOUNTER — Encounter (HOSPITAL_COMMUNITY): Payer: Self-pay | Admitting: Emergency Medicine

## 2018-01-09 DIAGNOSIS — E1159 Type 2 diabetes mellitus with other circulatory complications: Secondary | ICD-10-CM

## 2018-01-09 DIAGNOSIS — I10 Essential (primary) hypertension: Secondary | ICD-10-CM | POA: Diagnosis not present

## 2018-01-09 DIAGNOSIS — R739 Hyperglycemia, unspecified: Secondary | ICD-10-CM

## 2018-01-09 DIAGNOSIS — E1165 Type 2 diabetes mellitus with hyperglycemia: Secondary | ICD-10-CM | POA: Diagnosis not present

## 2018-01-09 DIAGNOSIS — E118 Type 2 diabetes mellitus with unspecified complications: Secondary | ICD-10-CM

## 2018-01-09 DIAGNOSIS — E785 Hyperlipidemia, unspecified: Secondary | ICD-10-CM

## 2018-01-09 DIAGNOSIS — Z794 Long term (current) use of insulin: Secondary | ICD-10-CM

## 2018-01-09 DIAGNOSIS — E1169 Type 2 diabetes mellitus with other specified complication: Secondary | ICD-10-CM

## 2018-01-09 DIAGNOSIS — Z760 Encounter for issue of repeat prescription: Secondary | ICD-10-CM

## 2018-01-09 DIAGNOSIS — I152 Hypertension secondary to endocrine disorders: Secondary | ICD-10-CM

## 2018-01-09 DIAGNOSIS — IMO0002 Reserved for concepts with insufficient information to code with codable children: Secondary | ICD-10-CM

## 2018-01-09 DIAGNOSIS — E1140 Type 2 diabetes mellitus with diabetic neuropathy, unspecified: Secondary | ICD-10-CM | POA: Diagnosis not present

## 2018-01-09 LAB — POCT I-STAT, CHEM 8
BUN: 20 mg/dL (ref 8–23)
CHLORIDE: 102 mmol/L (ref 98–111)
CREATININE: 0.8 mg/dL (ref 0.61–1.24)
Calcium, Ion: 1.22 mmol/L (ref 1.15–1.40)
Glucose, Bld: 432 mg/dL — ABNORMAL HIGH (ref 70–99)
HEMATOCRIT: 51 % (ref 39.0–52.0)
Hemoglobin: 17.3 g/dL — ABNORMAL HIGH (ref 13.0–17.0)
Potassium: 4.4 mmol/L (ref 3.5–5.1)
SODIUM: 137 mmol/L (ref 135–145)
TCO2: 25 mmol/L (ref 22–32)

## 2018-01-09 MED ORDER — DAPAGLIFLOZIN PROPANEDIOL 10 MG PO TABS: 10.0000 mg | ORAL_TABLET | Freq: Every day | ORAL | 1 refills | Status: DC

## 2018-01-09 MED ORDER — OLMESARTAN-AMLODIPINE-HCTZ 40-5-25 MG PO TABS: 1.0000 | ORAL_TABLET | Freq: Every day | ORAL | 1 refills | Status: DC

## 2018-01-09 MED ORDER — GLUCOSE BLOOD VI STRP: ORAL_STRIP | 11 refills | Status: DC

## 2018-01-09 MED ORDER — PEN NEEDLES 31G X 8 MM MISC: 1 refills | Status: DC

## 2018-01-09 MED ORDER — ATORVASTATIN CALCIUM 20 MG PO TABS: 20.0000 mg | ORAL_TABLET | Freq: Every day | ORAL | 1 refills | Status: DC

## 2018-01-09 MED ORDER — INSULIN GLARGINE 100 UNIT/ML SOLOSTAR PEN
40.0000 [IU] | PEN_INJECTOR | Freq: Every day | SUBCUTANEOUS | 1 refills | Status: DC
Start: 2018-01-09 — End: 2018-08-06

## 2018-01-09 MED ORDER — METFORMIN HCL ER 750 MG PO TB24
750.0000 mg | ORAL_TABLET | Freq: Two times a day (BID) | ORAL | 1 refills | Status: DC
Start: 2018-01-09 — End: 2018-08-06

## 2018-01-09 NOTE — ED Triage Notes (Signed)
Pt states hes been out of his blood sugar medicine for six months. Pt states his vision is blurry, hes been using the restroom frequently. Pt states he checked his sugar today and it was 400. Unable to see his PCP due to an outstanding balance.

## 2018-01-09 NOTE — Telephone Encounter (Signed)
Wrong medication sent to the pharmacy. Correct medication sent. Insulin pen.

## 2018-01-09 NOTE — ED Provider Notes (Signed)
[place]    CSN: [id number] Arrival date & time: 01/09/18  1145     History   Chief Complaint Chief Complaint  Patient presents with  . Hyperglycemia    HPI Micheal Leblanc is a 67 y.o. male.   Patient is a 67 year old male with past medical history of diabetes, hypertension, hyperlipidemia.  He presents today for medication refill on all of his medications.  He reports that he has not been taking his medication like he is supposed to over the last 6 months.  He ran out of metformin last week and insulin on Saturday evening.  He has been having increased thirst, blurred vision, urinary frequency and loss of appetite.  He denies any nausea, vomiting, diarrhea, abdominal pain, dizziness, chest pain, shortness of breath.  He has been having neuropathic pain in his feet but this is not new.  He checked his blood sugar at 3:30 this morning and it was 325 and then again at 10 AM and his blood sugar was 425.  He reports a almost 20 pound weight loss since January.   ROS per HPI      Past Medical History:  Diagnosis Date  . Allergy   . Diabetes mellitus   . Diverticulitis   . ED (erectile dysfunction)   . Hx of colonic polyps   . Hyperlipidemia   . Hypertension   . Smoker     Patient Active Problem List   Diagnosis Date Noted  . Hyperlipidemia associated with type 2 diabetes mellitus ([place]) 04/17/2014  . Arthritis 08/26/2011  . Smoker 12/16/2010  . Diverticulosis 12/16/2010  . Colonic polyp 12/16/2010  . 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 Hypertension complicating diabetes ([place]) 12/16/2010  . Diabetes mellitus type 2, uncontrolled, with complications ([place]) 03/47/4259    Past Surgical History:  Procedure Laterality Date  . COLONOSCOPY    . POLYPECTOMY         Home Medications    Prior to Admission medications   Medication Sig Start Date End Date Taking? Authorizing Provider  atorvastatin (LIPITOR) 20 MG tablet Take 1 tablet (20 mg total) by mouth  daily. 01/09/18   Loura Halt A, NP  dapagliflozin propanediol (FARXIGA) 10 MG TABS tablet Take 10 mg by mouth daily. 01/09/18   Loura Halt A, NP  erythromycin ophthalmic ointment Place 1 application into the right eye daily. 09/26/17   [provider]  glucose blood (ONE TOUCH ULTRA TEST) test strip Patient is to test BID DX: E11.9 01/09/18   Kellin Fifer A, NP  Insulin Pen Needle (PEN NEEDLES) 31G X 8 MM MISC Use this for lantus solorstar pen 01/09/18   Loura Halt A, NP  metFORMIN (GLUCOPHAGE-XR) 750 MG 24 hr tablet Take 1 tablet (750 mg total) by mouth 2 (two) times daily. 01/09/18   Jennelle Pinkstaff, Tressia Miners A, NP  Olmesartan-amLODIPine-HCTZ (TRIBENZOR) 40-5-25 MG TABS Take 1 tablet by mouth daily. 01/09/18   Orvan July, NP    Family History Family History  Problem Relation Age of Onset  . Arthritis Mother   . Colon cancer Neg Hx   . Rectal cancer Neg Hx   . Stomach cancer Neg Hx   . Colon polyps Neg Hx   . Diabetes Neg Hx     Social History Social History   Tobacco Use  . Smoking status: Current Every Day Smoker    Packs/day: 0.50  . Smokeless tobacco: Never Used  . Tobacco comment: trying to quit - down to 20 a  day   Substance Use Topics  . Alcohol use: Yes    Alcohol/week: 2.4 oz    Types: 2 Cans of beer, 2 Shots of liquor per week  . Drug use: No     Allergies   Patient has no known allergies.   Review of Systems Review of Systems   Physical Exam Triage Vital Signs ED Triage Vitals [01/09/18 1215]  Enc Vitals Group     BP (!) 150/104     Pulse Rate 72     Resp 18     Temp (!) 97.5 F (36.4 C)     Temp src      SpO2 98 %     Weight      Height      Head Circumference      Peak Flow      Pain Score      Pain Loc      Pain Edu?      Excl. in [place]?    No data found.  Updated Vital Signs BP (!) 150/104   Pulse 72   Temp (!) 97.5 F (36.4 C)   Resp 18   SpO2 98%   Visual Acuity Right Eye Distance:   Left Eye Distance:   Bilateral Distance:     Right Eye Near:   Left Eye Near:    Bilateral Near:     Physical Exam  Constitutional: He is oriented to person, place, and time. He appears well-developed and well-nourished.  HENT:  Head: Normocephalic and atraumatic.  Eyes: Pupils are equal, round, and reactive to light. Conjunctivae and EOM are normal.  Neck: Normal range of motion. Neck supple.  Cardiovascular: Normal rate, regular rhythm, normal heart sounds and intact distal pulses.  Pulmonary/Chest: Effort normal and breath sounds normal.  Abdominal: Soft. Bowel sounds are normal.  Musculoskeletal: Normal range of motion.  Neurological: He is alert and oriented to person, place, and time.  Skin: Skin is warm. Capillary refill takes less than 2 seconds.  Psychiatric: He has a normal mood and affect.      UC Treatments / Results  Labs (all labs ordered are listed, but only abnormal results are displayed) Labs Reviewed  POCT I-STAT, CHEM 8 - Abnormal; Notable for the following components:      Result Value   Glucose, Bld 432 (*)    Hemoglobin 17.3 (*)    All other components within normal limits    EKG None  Radiology No results found.  Procedures Procedures (including critical care time)  Medications Ordered in UC Medications - No data to display  Initial Impression / Assessment and Plan / UC Course  I have reviewed the triage vital signs and the nursing notes.  Pertinent labs & imaging results that were available during my care of the patient were reviewed by me and considered in my medical decision making (see chart for details).     I-STAT Chem-8 obtained.  Kidney function normal.  Glucose 432.  Blood pressure elevated today in clinic.  No concerning signs on exam.  refilling cholesterol, blood pressure, diabetes medications.  Patient reports that he will follow-up once his balance at his primary care office is paid off.  He is aware that he needs more blood work done.  Final Clinical Impressions(s) /  UC Diagnoses   Final diagnoses:  Hyperglycemia     Discharge Instructions     It was nice meeting you!!  Your blood sugar was 432 today. Your kidney function  was normal.  I am going to refill all of your medications.  Please follow up with your PCP in the next month after you pay the bill and get reestablished so you can have a physical and more blood work done.  Please take your medications as prescribed.  Of you develop any worsening symptoms please go tot he ER.     ED Prescriptions    Medication Sig Dispense Auth. Provider   atorvastatin (LIPITOR) 20 MG tablet Take 1 tablet (20 mg total) by mouth daily. 90 tablet Seriah Brotzman A, NP   dapagliflozin propanediol (FARXIGA) 10 MG TABS tablet Take 10 mg by mouth daily. 90 tablet Kabao Leite A, NP   Insulin Pen Needle (PEN NEEDLES) 31G X 8 MM MISC Use this for lantus solorstar pen 100 each Niyla Marone A, NP   metFORMIN (GLUCOPHAGE-XR) 750 MG 24 hr tablet Take 1 tablet (750 mg total) by mouth 2 (two) times daily. 180 tablet Kathia Covington A, NP   Olmesartan-amLODIPine-HCTZ (TRIBENZOR) 40-5-25 MG TABS Take 1 tablet by mouth daily. 90 tablet Mckenna Gamm A, NP   glucose blood (ONE TOUCH ULTRA TEST) test strip Patient is to test BID DX: E11.9 100 each Loura Halt A, NP     Controlled Substance Prescriptions [place] Controlled Substance Registry consulted? Not Applicable   Orvan July, NP 01/09/18 1422

## 2018-01-09 NOTE — Discharge Instructions (Signed)
It was nice meeting you!!  Your blood sugar was 432 today. Your kidney function was normal.  I am going to refill all of your medications.  Please follow up with your PCP in the next month after you pay the bill and get reestablished so you can have a physical and more blood work done.  Please take your medications as prescribed.  Of you develop any worsening symptoms please go tot he ER.

## 2018-03-10 DIAGNOSIS — M67911 Unspecified disorder of synovium and tendon, right shoulder: Secondary | ICD-10-CM | POA: Diagnosis not present

## 2018-03-10 DIAGNOSIS — M24811 Other specific joint derangements of right shoulder, not elsewhere classified: Secondary | ICD-10-CM | POA: Diagnosis not present

## 2018-08-06 ENCOUNTER — Ambulatory Visit (HOSPITAL_COMMUNITY)
Admission: EM | Admit: 2018-08-06 | Discharge: 2018-08-06 | Disposition: A | Discharge status: Home / Self Care | Payer: Medicare Other | Attending: Internal Medicine | Admitting: Internal Medicine

## 2018-08-06 ENCOUNTER — Encounter (HOSPITAL_COMMUNITY): Payer: Self-pay

## 2018-08-06 DIAGNOSIS — IMO0002 Reserved for concepts with insufficient information to code with codable children: Secondary | ICD-10-CM

## 2018-08-06 DIAGNOSIS — E1165 Type 2 diabetes mellitus with hyperglycemia: Secondary | ICD-10-CM | POA: Diagnosis not present

## 2018-08-06 DIAGNOSIS — E118 Type 2 diabetes mellitus with unspecified complications: Secondary | ICD-10-CM

## 2018-08-06 DIAGNOSIS — L02412 Cutaneous abscess of left axilla: Secondary | ICD-10-CM

## 2018-08-06 DIAGNOSIS — E785 Hyperlipidemia, unspecified: Secondary | ICD-10-CM

## 2018-08-06 DIAGNOSIS — Z794 Long term (current) use of insulin: Secondary | ICD-10-CM | POA: Diagnosis not present

## 2018-08-06 DIAGNOSIS — E1159 Type 2 diabetes mellitus with other circulatory complications: Secondary | ICD-10-CM

## 2018-08-06 DIAGNOSIS — Z760 Encounter for issue of repeat prescription: Secondary | ICD-10-CM

## 2018-08-06 DIAGNOSIS — E1169 Type 2 diabetes mellitus with other specified complication: Secondary | ICD-10-CM

## 2018-08-06 DIAGNOSIS — I10 Essential (primary) hypertension: Secondary | ICD-10-CM

## 2018-08-06 LAB — GLUCOSE, CAPILLARY: GLUCOSE-CAPILLARY: 333 mg/dL — AB (ref 70–99)

## 2018-08-06 MED ORDER — PEN NEEDLES 31G X 8 MM MISC: 1 refills | Status: DC

## 2018-08-06 MED ORDER — ERYTHROMYCIN 5 MG/GM OP OINT: 1.0000 "application " | TOPICAL_OINTMENT | Freq: Every day | OPHTHALMIC | 0 refills | Status: DC

## 2018-08-06 MED ORDER — ATORVASTATIN CALCIUM 20 MG PO TABS: 20.0000 mg | ORAL_TABLET | Freq: Every day | ORAL | 1 refills | Status: DC

## 2018-08-06 MED ORDER — OLMESARTAN-AMLODIPINE-HCTZ 40-5-25 MG PO TABS: 1.0000 | ORAL_TABLET | Freq: Every day | ORAL | 1 refills | Status: DC

## 2018-08-06 MED ORDER — GLUCOSE BLOOD VI STRP: ORAL_STRIP | 11 refills | Status: DC

## 2018-08-06 MED ORDER — INSULIN GLARGINE 100 UNIT/ML SOLOSTAR PEN: 40.0000 [IU] | PEN_INJECTOR | Freq: Every day | SUBCUTANEOUS | 1 refills | Status: DC

## 2018-08-06 MED ORDER — METFORMIN HCL ER 750 MG PO TB24: 750.0000 mg | ORAL_TABLET | Freq: Two times a day (BID) | ORAL | 1 refills | Status: DC

## 2018-08-06 MED ORDER — DAPAGLIFLOZIN PROPANEDIOL 10 MG PO TABS: 10.0000 mg | ORAL_TABLET | Freq: Every day | ORAL | 1 refills | Status: DC

## 2018-08-06 NOTE — ED Triage Notes (Signed)
Pt presents with abscess under left arm and pt is also presents to get medication refill on insulin.

## 2018-08-06 NOTE — ED Provider Notes (Signed)
[name]    CSN: [id number] Arrival date & time: 08/06/18  1638     History   Chief Complaint Chief Complaint  Patient presents with  . Medication Refill  . Abscess    HPI Micheal Leblanc is a 68 y.o. male with a history of diabetes mellitus on insulin, hyperlipidemia and hypertension comes in for medication refill.  Patient ran out of his insulin a few days ago.  Patient also complains of left axillary lump which increased in size and drained purulent fluid over the past few days.  Pain improved after the drainage.  He denied any fever or chills.  Patient has had a stable lump in the left axilla over the past several years.  This is being followed by the primary care physician.  This morning patient had a can of Pepsi and his blood sugars have been elevated since then.  Blood sugars were in the 400s this morning.  Repeat blood sugar check in the urgent care was 333.  Patient denies any polyuria, polydipsia polyphagia.  HPI  Past Medical History:  Diagnosis Date  . Allergy   . Diabetes mellitus   . Diverticulitis   . ED (erectile dysfunction)   . Hx of colonic polyps   . Hyperlipidemia   . Hypertension   . Smoker     Patient Active Problem List   Diagnosis Date Noted  . Hyperlipidemia associated with type 2 diabetes mellitus ([place]) 04/17/2014  . Arthritis 08/26/2011  . Smoker 12/16/2010  . Diverticulosis 12/16/2010  . Colonic polyp 12/16/2010  . 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 Hypertension complicating diabetes ([place]) 12/16/2010  . Diabetes mellitus type 2, uncontrolled, with complications ([address]) 67/61/9509    Past Surgical History:  Procedure Laterality Date  . COLONOSCOPY    . POLYPECTOMY         Home Medications    Prior to Admission medications   Medication Sig Start Date End Date Taking? Authorizing Provider  atorvastatin (LIPITOR) 20 MG tablet Take 1 tablet (20 mg total) by mouth daily. 01/09/18   Loura Halt A, NP  dapagliflozin propanediol  (FARXIGA) 10 MG TABS tablet Take 10 mg by mouth daily. 01/09/18   Loura Halt A, NP  erythromycin ophthalmic ointment Place 1 application into the right eye daily. 09/26/17   [provider]  glucose blood (ONE TOUCH ULTRA TEST) test strip Patient is to test BID DX: E11.9 01/09/18   Loura Halt A, NP  Insulin Glargine (LANTUS SOLOSTAR) 100 UNIT/ML Solostar Pen Inject 40 Units into the skin daily. 01/09/18   Loura Halt A, NP  Insulin Pen Needle (PEN NEEDLES) 31G X 8 MM MISC Use this for lantus solorstar pen 01/09/18   Loura Halt A, NP  metFORMIN (GLUCOPHAGE-XR) 750 MG 24 hr tablet Take 1 tablet (750 mg total) by mouth 2 (two) times daily. 01/09/18   Bast, Tressia Miners A, NP  Olmesartan-amLODIPine-HCTZ (TRIBENZOR) 40-5-25 MG TABS Take 1 tablet by mouth daily. 01/09/18   Orvan July, NP    Family History Family History  Problem Relation Age of Onset  . Arthritis Mother   . Colon cancer Neg Hx   . Rectal cancer Neg Hx   . Stomach cancer Neg Hx   . Colon polyps Neg Hx   . Diabetes Neg Hx     Social History Social History   Tobacco Use  . Smoking status: Current Every Day Smoker    Packs/day: 0.50  . Smokeless tobacco: Never Used  . Tobacco  comment: trying to quit - down to 20 a day   Substance Use Topics  . Alcohol use: Yes    Alcohol/week: 4.0 standard drinks    Types: 2 Cans of beer, 2 Shots of liquor per week  . Drug use: No     Allergies   Patient has no known allergies.   Review of Systems Review of Systems  Constitutional: Negative for activity change, appetite change, chills, fatigue and fever.  HENT: Negative for mouth sores, postnasal drip, rhinorrhea, sinus pressure and sore throat.   Respiratory: Negative for cough, chest tightness and wheezing.   Cardiovascular: Negative for chest pain and palpitations.  Gastrointestinal: Negative for abdominal pain.  Endocrine: Negative for polydipsia, polyphagia and polyuria.  Genitourinary: Negative for dysuria, enuresis and  urgency.  Skin: Positive for rash and wound. Negative for pallor.  Neurological: Negative for dizziness, light-headedness and headaches.  Hematological: Negative for adenopathy.     Physical Exam Triage Vital Signs ED Triage Vitals  Enc Vitals Group     BP 08/06/18 1720 129/88     Pulse Rate 08/06/18 1720 70     Resp 08/06/18 1720 17     Temp 08/06/18 1720 97.7 F (36.5 C)     Temp Source 08/06/18 1720 Oral     SpO2 08/06/18 1720 100 %     Weight --      Height --      Head Circumference --      Peak Flow --      Pain Score 08/06/18 1721 0     Pain Loc --      Pain Edu? --      Excl. in [place]? --    No data found.  Updated Vital Signs BP 129/88 (BP Location: Right Arm)   Pulse 70   Temp 97.7 F (36.5 C) (Oral)   Resp 17   SpO2 100%   Visual Acuity Right Eye Distance:   Left Eye Distance:   Bilateral Distance:    Right Eye Near:   Left Eye Near:    Bilateral Near:     Physical Exam Constitutional:      Appearance: Normal appearance. He is not ill-appearing or toxic-appearing.  HENT:     Mouth/Throat:     Mouth: Mucous membranes are moist.     Pharynx: No oropharyngeal exudate or posterior oropharyngeal erythema.  Eyes:     Pupils: Pupils are equal, round, and reactive to light.  Neck:     Musculoskeletal: Normal range of motion. No neck rigidity.  Cardiovascular:     Rate and Rhythm: Normal rate and regular rhythm.     Pulses: Normal pulses.     Heart sounds: Normal heart sounds.  Pulmonary:     Effort: Pulmonary effort is normal.     Breath sounds: Normal breath sounds.  Musculoskeletal: Normal range of motion.  Skin:    General: Skin is warm and dry.     Capillary Refill: Capillary refill takes less than 2 seconds.     Comments: Left axilla swelling without surrounding erythema.  It measures 1 cm in the longest diameter.  It is firm with some areas of fluctuance.  Neurological:     General: No focal deficit present.     Mental Status: He is alert  and oriented to person, place, and time.     Cranial Nerves: No cranial nerve deficit.     Sensory: No sensory deficit.      UC Treatments / Results  Labs (all labs ordered are listed, but only abnormal results are displayed) Labs Reviewed - No data to display  EKG None  Radiology No results found.  Procedures Incision and Drainage Date/Time: 08/06/2018 6:18 PM Performed by: Chase Picket, MD Authorized by: Chase Picket, MD   Consent:    Consent obtained:  Verbal   Consent given by:  Patient   Risks discussed:  Bleeding, infection, incomplete drainage and pain   Alternatives discussed:  No treatment and observation Location:    Type:  Abscess   Size:  1-2 CM Pre-procedure details:    Skin preparation:  Betadine Anesthesia (see MAR for exact dosages):    Anesthesia method:  Local infiltration   Local anesthetic:  Lidocaine 2% w/o epi Procedure type:    Complexity:  Simple Procedure details:    Needle aspiration: no     Incision types:  Stab incision   Incision depth:  Subcutaneous   Scalpel blade:  10   Drainage:  Bloody   Drainage amount:  Scant   Wound treatment:  Wound left open   Packing materials:  None Post-procedure details:    Patient tolerance of procedure:  Tolerated well, no immediate complications   (including critical care time)  Medications Ordered in UC Medications - No data to display  Initial Impression / Assessment and Plan / UC Course  I have reviewed the triage vital signs and the nursing notes.  Pertinent labs & imaging results that were available during my care of the patient were reviewed by me and considered in my medical decision making (see chart for details).     1.  Diabetes mellitus type 2 with hyperglycemia: Refill metformin, Lantus and Farxiga Dietary discretion counseling was given to the patient.  2.  Left axillary abscess status post incision and drainage: Routine care Incision and drainage done with minimal  pus noted.  3.  Hypertension: Stable  4.  Hyperlipidemia: Stable Final Clinical Impressions(s) / UC Diagnoses   Final diagnoses:  None   Discharge Instructions   None    ED Prescriptions    None     Controlled Substance Prescriptions [place] Controlled Substance Registry consulted? No   Chase Picket, MD 08/06/18 1820

## 2018-12-12 ENCOUNTER — Ambulatory Visit (HOSPITAL_COMMUNITY)
Admission: EM | Admit: 2018-12-12 | Discharge: 2018-12-12 | Disposition: A | Discharge status: Home / Self Care | Payer: Medicare Other | Attending: Emergency Medicine | Admitting: Emergency Medicine

## 2018-12-12 ENCOUNTER — Other Ambulatory Visit: Payer: Self-pay

## 2018-12-12 ENCOUNTER — Encounter (HOSPITAL_COMMUNITY): Payer: Self-pay

## 2018-12-12 DIAGNOSIS — M79675 Pain in left toe(s): Secondary | ICD-10-CM | POA: Insufficient documentation

## 2018-12-12 DIAGNOSIS — Z794 Long term (current) use of insulin: Secondary | ICD-10-CM | POA: Diagnosis not present

## 2018-12-12 DIAGNOSIS — Z760 Encounter for issue of repeat prescription: Secondary | ICD-10-CM | POA: Insufficient documentation

## 2018-12-12 DIAGNOSIS — E119 Type 2 diabetes mellitus without complications: Secondary | ICD-10-CM | POA: Diagnosis not present

## 2018-12-12 DIAGNOSIS — E1165 Type 2 diabetes mellitus with hyperglycemia: Secondary | ICD-10-CM | POA: Diagnosis not present

## 2018-12-12 DIAGNOSIS — I10 Essential (primary) hypertension: Secondary | ICD-10-CM | POA: Diagnosis not present

## 2018-12-12 DIAGNOSIS — E118 Type 2 diabetes mellitus with unspecified complications: Secondary | ICD-10-CM | POA: Diagnosis not present

## 2018-12-12 DIAGNOSIS — IMO0002 Reserved for concepts with insufficient information to code with codable children: Secondary | ICD-10-CM

## 2018-12-12 LAB — GLUCOSE, CAPILLARY: Glucose-Capillary: 135 mg/dL — ABNORMAL HIGH (ref 70–99)

## 2018-12-12 LAB — HEMOGLOBIN A1C
Hgb A1c MFr Bld: 9.6 % — ABNORMAL HIGH (ref 4.8–5.6)
Mean Plasma Glucose: 228.82 mg/dL

## 2018-12-12 MED ORDER — NAPROXEN 375 MG PO TABS: 375.0000 mg | ORAL_TABLET | Freq: Two times a day (BID) | ORAL | 0 refills | Status: DC

## 2018-12-12 MED ORDER — PEN NEEDLES 31G X 8 MM MISC: 1 refills | Status: AC

## 2018-12-12 MED ORDER — GLUCOSE BLOOD VI STRP: ORAL_STRIP | 11 refills | Status: DC

## 2018-12-12 MED ORDER — LANTUS SOLOSTAR 100 UNIT/ML ~~LOC~~ SOPN: 40.0000 [IU] | PEN_INJECTOR | Freq: Every day | SUBCUTANEOUS | 1 refills | Status: DC

## 2018-12-12 NOTE — Discharge Instructions (Addendum)
I have refilled your insulin, please follow up with primary care for further management of diabetes Continue checking your blood sugar  Try Naprosyn twice daily as needed for pain If you develop increased pain, redness, swelling, skin breakdown, decreased sensation or symptoms not resolving please follow-up here or with your primary care.

## 2018-12-12 NOTE — ED Provider Notes (Signed)
[facility]    CSN: [id number] Arrival date & time: 12/12/18  1432      History   Chief Complaint Chief Complaint  Patient presents with  . Medication Refill    HPI Micheal Leblanc is a 68 y.o. male history of tobacco use, hypertension, hyperlipidemia, DM type II, presenting today for evaluation of medication refill of his insulin as well as left great toe pain and leg pain.  Patient states that over the past couple weeks he has been conserving his insulin and only using every other day as he did not want to run out.  He recently ran out.  Notes that he last checked his blood sugar on Sunday, 2 days ago and was 115.  He currently has been on been unable to follow-up with his PCP as he owes money to them.  He denies any issues with his medicines, does not need refills on metformin or blood pressure medicine.  Has been unable to obtain farxiga due to cost.  He is mainly requesting refill of his Lantus.  Denies nausea vomiting fatigue or abdominal pain.  Over the past couple weeks he has noted pain to his left great toe.  Denies changes in skin or color.  Denies any breakdown or ulcer.  States that he has some slight tingling sensation, but feels as if the pain is in his bone.  States it is slightly improved from previously.  Pain mainly worsens with weightbearing.  Occasionally will have pain in his leg as well.  Worse with activity, improves with rest.  Denies history of vascular issues.  HPI  Past Medical History:  Diagnosis Date  . Allergy   . Diabetes mellitus   . Diverticulitis   . ED (erectile dysfunction)   . Hx of colonic polyps   . Hyperlipidemia   . Hypertension   . Smoker     Patient Active Problem List   Diagnosis Date Noted  . Hyperlipidemia associated with type 2 diabetes mellitus ([place]) 04/17/2014  . Arthritis 08/26/2011  . Smoker 12/16/2010  . Diverticulosis 12/16/2010  . Colonic polyp 12/16/2010  . 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 Hypertension  complicating diabetes ([place]) 12/16/2010  . Diabetes mellitus type 2, uncontrolled, with complications ([place]) 24/23/5361    Past Surgical History:  Procedure Laterality Date  . COLONOSCOPY    . POLYPECTOMY         Home Medications    Prior to Admission medications   Medication Sig Start Date End Date Taking? Authorizing Provider  atorvastatin (LIPITOR) 20 MG tablet Take 1 tablet (20 mg total) by mouth daily. 08/06/18   LampteyMyrene Galas, MD  dapagliflozin propanediol (FARXIGA) 10 MG TABS tablet Take 10 mg by mouth daily. 08/06/18   LampteyMyrene Galas, MD  erythromycin ophthalmic ointment Place 1 application into the right eye daily. 08/06/18   Chase Picket, MD  glucose blood (ONE TOUCH ULTRA TEST) test strip Patient is to test BID DX: E11.9 12/12/18   ,  C, PA-C  Insulin Glargine (LANTUS SOLOSTAR) 100 UNIT/ML Solostar Pen Inject 40 Units into the skin daily. 12/12/18   ,  C, PA-C  Insulin Pen Needle (PEN NEEDLES) 31G X 8 MM MISC Use this for lantus solorstar pen 12/12/18   , [employer] C, PA-C  metFORMIN (GLUCOPHAGE-XR) 750 MG 24 hr tablet Take 1 tablet (750 mg total) by mouth 2 (two) times daily. 08/06/18   Chase Picket, MD  naproxen (NAPROSYN) 375 MG tablet Take 1 tablet (  375 mg total) by mouth 2 (two) times daily. 12/12/18   ,  C, PA-C  Olmesartan-amLODIPine-HCTZ (TRIBENZOR) 40-5-25 MG TABS Take 1 tablet by mouth daily. 08/06/18   LampteyMyrene Galas, MD    Family History Family History  Problem Relation Age of Onset  . Arthritis Mother   . Colon cancer Neg Hx   . Rectal cancer Neg Hx   . Stomach cancer Neg Hx   . Colon polyps Neg Hx   . Diabetes Neg Hx     Social History Social History   Tobacco Use  . Smoking status: Current Every Day Smoker    Packs/day: 0.50  . Smokeless tobacco: Never Used  . Tobacco comment: trying to quit - down to 20 a day   Substance Use Topics  . Alcohol use: Yes    Alcohol/week: 4.0 standard drinks     Types: 2 Cans of beer, 2 Shots of liquor per week  . Drug use: No     Allergies   Patient has no known allergies.   Review of Systems Review of Systems  Constitutional: Negative for fatigue and fever.  Eyes: Negative for redness, itching and visual disturbance.  Respiratory: Negative for shortness of breath.   Cardiovascular: Negative for chest pain and leg swelling.  Gastrointestinal: Negative for nausea and vomiting.  Musculoskeletal: Positive for arthralgias. Negative for myalgias.  Skin: Negative for color change, rash and wound.  Neurological: Negative for dizziness, syncope, weakness, light-headedness and headaches.     Physical Exam Triage Vital Signs ED Triage Vitals  Enc Vitals Group     BP 12/12/18 1508 (!) 149/87     Pulse Rate 12/12/18 1508 66     Resp 12/12/18 1508 16     Temp 12/12/18 1508 98.4 F (36.9 C)     Temp Source 12/12/18 1508 Oral     SpO2 12/12/18 1508 100 %     Weight 12/12/18 1511 170 lb (77.1 kg)     Height --      Head Circumference --      Peak Flow --      Pain Score 12/12/18 1511 2     Pain Loc --      Pain Edu? --      Excl. in [place]? --    No data found.  Updated Vital Signs BP (!) 149/87 (BP Location: Right Arm)   Pulse 66   Temp 98.4 F (36.9 C) (Oral)   Resp 16   Wt 170 lb (77.1 kg)   SpO2 100%   BMI 23.71 kg/m   Visual Acuity Right Eye Distance:   Left Eye Distance:   Bilateral Distance:    Right Eye Near:   Left Eye Near:    Bilateral Near:     Physical Exam Vitals signs and nursing note reviewed.  Constitutional:      Appearance: He is well-developed.     Comments: No acute distress  HENT:     Head: Normocephalic and atraumatic.     Nose: Nose normal.  Eyes:     Conjunctiva/sclera: Conjunctivae normal.  Neck:     Musculoskeletal: Neck supple.  Cardiovascular:     Rate and Rhythm: Normal rate.  Pulmonary:     Effort: Pulmonary effort is normal. No respiratory distress.  Abdominal:     General: There  is no distension.  Musculoskeletal: Normal range of motion.     Comments: Left great toe without erythema or swelling, no notable skin breakdown; nontender to  touch along toe, nontender throughout dorsum of foot Dorsalis pedis 2+ Cap refill less than 2 seconds No swelling noted to bilateral lower extremities, no calf tenderness, no overlying erythema. Sensation intact distally  Does appear to have some varicosities in foot.  Skin:    General: Skin is warm and dry.  Neurological:     Mental Status: He is alert and oriented to person, place, and time.      UC Treatments / Results  Labs (all labs ordered are listed, but only abnormal results are displayed) Labs Reviewed  HEMOGLOBIN A1C - Abnormal; Notable for the following components:      Result Value   Hgb A1c MFr Bld 9.6 (*)    All other components within normal limits  GLUCOSE, CAPILLARY - Abnormal; Notable for the following components:   Glucose-Capillary 135 (*)    All other components within normal limits  CBG MONITORING, ED    EKG None  Radiology No results found.  Procedures Procedures (including critical care time)  Medications Ordered in UC Medications - No data to display  Initial Impression / Assessment and Plan / UC Course  I have reviewed the triage vital signs and the nursing notes.  Pertinent labs & imaging results that were available during my care of the patient were reviewed by me and considered in my medical decision making (see chart for details).     We will refill Lantus advised to continue to monitor blood sugar.  Stressed importance of having PCP.  Will recheck A1c today in order to have available for PCP follow-up.  Exam of left foot/leg unremarkable, no sign of cellulitis or gout at this time.  Possible arthritic/inflammatory cause, will recommend anti-inflammatories.  Seems to have good blood flow, but possible claudication/insufficient blood flow with increased activity, recommending  following up with PCP/vascular for further evaluation.  Follow-up here in emergency room if symptoms persisting or worsening.  Discussed strict return precautions. Patient verbalized understanding and is agreeable with plan.  Final Clinical Impressions(s) / UC Diagnoses   Final diagnoses:  Medication refill  Type 2 diabetes mellitus without complication, with long-term current use of insulin (HCC)  Pain of toe of left foot     Discharge Instructions     I have refilled your insulin, please follow up with primary care for further management of diabetes Continue checking your blood sugar  Try Naprosyn twice daily as needed for pain If you develop increased pain, redness, swelling, skin breakdown, decreased sensation or symptoms not resolving please follow-up here or with your primary care.   ED Prescriptions    Medication Sig Dispense Auth. Provider   Insulin Glargine (LANTUS SOLOSTAR) 100 UNIT/ML Solostar Pen Inject 40 Units into the skin daily. 15 mL ,  C, PA-C   Insulin Pen Needle (PEN NEEDLES) 31G X 8 MM MISC Use this for lantus solorstar pen 100 each ,  C, PA-C   glucose blood (ONE TOUCH ULTRA TEST) test strip Patient is to test BID DX: E11.9 100 each ,  C, PA-C   naproxen (NAPROSYN) 375 MG tablet Take 1 tablet (375 mg total) by mouth 2 (two) times daily. 20 tablet , [place] C, PA-C     Controlled Substance Prescriptions [place] Controlled Substance Registry consulted? Not Applicable   Janith Lima, [place] 12/12/18 1801

## 2018-12-12 NOTE — ED Triage Notes (Signed)
Pt states he needs a med refill on his insulin.  Pt has a discomfort in his left leg for least a month. Pt states his big toe is giving him a pain as well.

## 2018-12-13 ENCOUNTER — Telehealth (HOSPITAL_COMMUNITY): Payer: Self-pay | Admitting: Emergency Medicine

## 2018-12-13 NOTE — Telephone Encounter (Signed)
Patient contacted and made aware of all results, all questions answered. Pt will follow up with PCP asap for continuing care. Pt states "I dont take my insuline very day". Pt encouraged to take medicine as instructed. Will pick up medicines today.

## 2019-02-16 ENCOUNTER — Other Ambulatory Visit: Payer: Self-pay

## 2019-02-16 NOTE — Patient Outreach (Signed)
[facility]) Care Management  02/16/2019  [name] 1951/04/24 [id number]   Medication Adherence call to [name] HIPPA Compliant Voice message left with a call back number. [name] is showing past due on Atorvastatin 20 mg and Olmesartan/Amlodipine/hctz under [name].   [name] Management Direct Dial [phone]  Fax [phone] [name]

## 2019-02-28 ENCOUNTER — Other Ambulatory Visit: Payer: Self-pay

## 2019-02-28 NOTE — Patient Outreach (Signed)
[facility]) Care Management  02/28/2019  BRAYLAND NELLES Mar 11, 1951 [id number]   Medication Adherence call to Mr. Kennon Semedo HIPPA Compliant Voice message left with a call back number. Mr. Palley is showing past due on Atorvastain 20 mg and Olmesartan/Amlodipine/Hctz under [place].  [place] Management Direct Dial [phone]  Fax [phone] [email or web address]

## 2019-03-22 ENCOUNTER — Other Ambulatory Visit: Payer: Self-pay

## 2019-03-22 ENCOUNTER — Ambulatory Visit (HOSPITAL_COMMUNITY)
Admission: EM | Admit: 2019-03-22 | Discharge: 2019-03-22 | Disposition: A | Discharge status: Home / Self Care | Payer: Medicare Other | Attending: Emergency Medicine | Admitting: Emergency Medicine

## 2019-03-22 ENCOUNTER — Encounter (HOSPITAL_COMMUNITY): Payer: Self-pay

## 2019-03-22 DIAGNOSIS — R809 Proteinuria, unspecified: Secondary | ICD-10-CM | POA: Diagnosis not present

## 2019-03-22 DIAGNOSIS — Z79899 Other long term (current) drug therapy: Secondary | ICD-10-CM | POA: Diagnosis not present

## 2019-03-22 DIAGNOSIS — E785 Hyperlipidemia, unspecified: Secondary | ICD-10-CM | POA: Diagnosis not present

## 2019-03-22 DIAGNOSIS — I10 Essential (primary) hypertension: Secondary | ICD-10-CM | POA: Diagnosis not present

## 2019-03-22 DIAGNOSIS — E119 Type 2 diabetes mellitus without complications: Secondary | ICD-10-CM | POA: Diagnosis not present

## 2019-03-22 DIAGNOSIS — E7800 Pure hypercholesterolemia, unspecified: Secondary | ICD-10-CM | POA: Diagnosis not present

## 2019-03-22 DIAGNOSIS — F17210 Nicotine dependence, cigarettes, uncomplicated: Secondary | ICD-10-CM | POA: Diagnosis not present

## 2019-03-22 DIAGNOSIS — Z20828 Contact with and (suspected) exposure to other viral communicable diseases: Secondary | ICD-10-CM | POA: Diagnosis not present

## 2019-03-22 DIAGNOSIS — R5383 Other fatigue: Secondary | ICD-10-CM | POA: Insufficient documentation

## 2019-03-22 DIAGNOSIS — R3915 Urgency of urination: Secondary | ICD-10-CM | POA: Insufficient documentation

## 2019-03-22 DIAGNOSIS — Z794 Long term (current) use of insulin: Secondary | ICD-10-CM | POA: Diagnosis not present

## 2019-03-22 LAB — POCT URINALYSIS DIP (DEVICE)
Glucose, UA: NEGATIVE mg/dL
Hgb urine dipstick: NEGATIVE
Leukocytes,Ua: NEGATIVE
Nitrite: NEGATIVE
Protein, ur: >=300 mg/dL — AB
Specific Gravity, Urine: >=1.03 (ref 1.005–1.030)
Urobilinogen, UA: 1 mg/dL (ref 0.0–1.0)
pH: 5.5 (ref 5.0–8.0)

## 2019-03-22 LAB — GLUCOSE, CAPILLARY: Glucose-Capillary: 153 mg/dL — ABNORMAL HIGH (ref 70–99)

## 2019-03-22 NOTE — Discharge Instructions (Addendum)
Your sugar was normal here today.  You do not have a urinary tract infection.  Your urine had some ketones in it, but I would expect this because you are not eating and drinking as much as you should be.  If you are not hungry, make sure that you take something in, whether it be soups, smoothies, high calorie shakes to keep your sugar up.  He has had some protein in your urine which is common for people with diabetes.  I would follow-up with your doctor about this.

## 2019-03-22 NOTE — ED Triage Notes (Addendum)
Pt present low blood sugar that started decreasing on Tuesday.  Pt states he is having some fatigue with loss of appetite

## 2019-03-22 NOTE — ED Provider Notes (Signed)
HPI  SUBJECTIVE:  Micheal Leblanc is a 68 y.o. male who presents with fatigue, decreased appetite for the past 3 days.  He states that he has not been eating much.  States that his sugar has been running lower than usual for him.  States it was 83 2 days ago, 117 yesterday, 121 today.  He has not been using his insulin this week because of the lower blood sugars.  He states that his normal glucose is somewhere between 140-150.  He reports urinary urgency and odorous urine, no dysuria, frequency, cloudy urine, hematuria, pelvic, back, abdominal pain.  No fevers, nasal congestion, rhinorrhea, sore throat, cough, shortness of breath, vomiting, diarrhea, abdominal pain.  Had a headache 3 days ago, but this is resolved.  No body aches.  No exposure to COVID.  No chest pain, shortness of breath, syncope, presyncope.  He has no active skin infections.  He has not tried anything for this.  He states symptoms are better when he eats.  No aggravating factors.  He has a past medical history of uncontrolled diabetes, last A1c was 9.6.  States that he has not taken the [place] in over 6 months because of cost.  States that he is compliant with metformin and insulin.  He has a history of hypercholesterolemia, hypertension, continues to smoke.  No history of MI, chronic kidney disease, UTI, prostatitis, DKA.  PMD: Denita Lung, MD   Past Medical History:  Diagnosis Date  . Allergy   . Diabetes mellitus   . Diverticulitis   . ED (erectile dysfunction)   . Hx of colonic polyps   . Hyperlipidemia   . Hypertension   . Smoker     Past Surgical History:  Procedure Laterality Date  . COLONOSCOPY    . POLYPECTOMY      Family History  Problem Relation Age of Onset  . Arthritis Mother   . Colon cancer Neg Hx   . Rectal cancer Neg Hx   . Stomach cancer Neg Hx   . Colon polyps Neg Hx   . Diabetes Neg Hx     Social History   Tobacco Use  . Smoking status: Current Every Day Smoker    Packs/day: 0.50   . Smokeless tobacco: Never Used  . Tobacco comment: trying to quit - down to 20 a day   Substance Use Topics  . Alcohol use: Yes    Alcohol/week: 4.0 standard drinks    Types: 2 Cans of beer, 2 Shots of liquor per week  . Drug use: No     Current Facility-Administered Medications:  .  0.9 %  sodium chloride infusion, 500 mL, Intravenous, Once, Milus Banister, MD  Current Outpatient Medications:  .  atorvastatin (LIPITOR) 20 MG tablet, Take 1 tablet (20 mg total) by mouth daily., Disp: 90 tablet, Rfl: 1 .  erythromycin ophthalmic ointment, Place 1 application into the right eye daily., Disp: 3.5 g, Rfl: 0 .  glucose blood (ONE TOUCH ULTRA TEST) test strip, Patient is to test BID DX: E11.9, Disp: 100 each, Rfl: 11 .  Insulin Glargine (LANTUS SOLOSTAR) 100 UNIT/ML Solostar Pen, Inject 40 Units into the skin daily., Disp: 15 mL, Rfl: 1 .  Insulin Pen Needle (PEN NEEDLES) 31G X 8 MM MISC, Use this for lantus solorstar pen, Disp: 100 each, Rfl: 1 .  metFORMIN (GLUCOPHAGE-XR) 750 MG 24 hr tablet, Take 1 tablet (750 mg total) by mouth 2 (two) times daily., Disp: 180 tablet, Rfl: 1 .  naproxen (NAPROSYN) 375 MG tablet, Take 1 tablet (375 mg total) by mouth 2 (two) times daily., Disp: 20 tablet, Rfl: 0 .  Olmesartan-amLODIPine-HCTZ (TRIBENZOR) 40-5-25 MG TABS, Take 1 tablet by mouth daily., Disp: 90 tablet, Rfl: 1  No Known Allergies   ROS  As noted in HPI.   Physical Exam  BP (!) 143/90 (BP Location: Left Arm)   Pulse 85   Temp 98.7 F (37.1 C) (Oral)   Resp 18   SpO2 99%   Constitutional: Well developed, well nourished, no acute distress Eyes: PERRL, EOMI, conjunctiva normal bilaterally HENT: Normocephalic, atraumatic,mucus membranes moist Respiratory: Clear to auscultation bilaterally, no rales, no wheezing, no rhonchi Cardiovascular: Normal rate and rhythm, no murmurs, no gallops, no rubs GI: Soft, nondistended, normal bowel sounds, nontender, no rebound, no guarding Back:  no CVAT skin: No rash, skin intact Musculoskeletal: No edema, no tenderness, no deformities Neurologic: Alert & oriented x 3, CN III-XII grossly intact, no motor deficits, sensation grossly intact Psychiatric: Speech and behavior appropriate   ED Course   Medications - No data to display  Orders Placed This Encounter  Procedures  . Novel Coronavirus, NAA (Hosp order, Send-out to Ref Lab; TAT 18-24 hrs    Standing Status:   Standing    Number of Occurrences:   1    Order Specific Question:   Is this test for diagnosis or screening    Answer:   Diagnosis of ill patient    Order Specific Question:   Symptomatic for COVID-19 as defined by CDC    Answer:   Yes    Order Specific Question:   Date of Symptom Onset    Answer:   03/19/2019    Order Specific Question:   Hospitalized for COVID-19    Answer:   No    Order Specific Question:   Admitted to ICU for COVID-19    Answer:   No    Order Specific Question:   Previously tested for COVID-19    Answer:   No    Order Specific Question:   Resident in a congregate (group) care setting    Answer:   No    Order Specific Question:   Employed in healthcare setting    Answer:   No  . Glucose, capillary    Standing Status:   Standing    Number of Occurrences:   1  . POC CBG monitoring    Standing Status:   Standing    Number of Occurrences:   1  . POCT urinalysis dip (device)    Standing Status:   Standing    Number of Occurrences:   1   Results for orders placed or performed during the hospital encounter of 03/22/19 (from the past 24 hour(s))  Glucose, capillary     Status: Abnormal   Collection Time: 03/22/19  1:24 PM  Result Value Ref Range   Glucose-Capillary 153 (H) 70 - 99 mg/dL  POCT urinalysis dip (device)     Status: Abnormal   Collection Time: 03/22/19  1:41 PM  Result Value Ref Range   Glucose, UA NEGATIVE NEGATIVE mg/dL   Bilirubin Urine SMALL (A) NEGATIVE   Ketones, ur TRACE (A) NEGATIVE mg/dL   Specific Gravity,  Urine >=1.030 1.005 - 1.030   Hgb urine dipstick NEGATIVE NEGATIVE   pH 5.5 5.0 - 8.0   Protein, ur >=300 (A) NEGATIVE mg/dL   Urobilinogen, UA 1.0 0.0 - 1.0 mg/dL   Nitrite NEGATIVE NEGATIVE   Leukocytes,Ua  NEGATIVE NEGATIVE   No results found.  ED Clinical Impression  1. Fatigue, unspecified type      ED Assessment/Plan  Glucose within normal range for the patient.  Will check urine because he reports urinary urgency and odorous urine, and COVID.  If both are negative, unsure as to the etiology of his symptoms. he has no other symptoms other than a decreased appetite, fatigue.  Advised him to drink smoothies, soups, high calorie density liquids until his appetite returns so that his sugar does not get too low.  He will need to follow-up with his doctor if his symptoms persist.  Urine negative for UTI.  He does have proteinuria, it is concentrated and he has trace ketones but I would expect this given his decreased p.o. intake.  Work note for tomorrow.  May return to work on Monday, states that he does not work over the weekend.  Discussed labs,  MDM, treatment plan, and plan for follow-up with patient Discussed sn/sx that should prompt return to the ED. patient agrees with plan.   No orders of the defined types were placed in this encounter.   *This clinic note was created using Dragon dictation software. Therefore, there may be occasional mistakes despite careful proofreading.  ?    Melynda Ripple, MD 03/22/19 1400

## 2019-03-25 LAB — NOVEL CORONAVIRUS, NAA (HOSP ORDER, SEND-OUT TO REF LAB; TAT 18-24 HRS): SARS-CoV-2, NAA: NOT DETECTED

## 2019-03-29 ENCOUNTER — Other Ambulatory Visit: Payer: Self-pay

## 2019-03-29 NOTE — Patient Outreach (Signed)
[facility]) Care Management  03/29/2019  Micheal Leblanc 1951-01-05 [id number]   Medication Adherence call to Micheal Leblanc HIPPA Compliant Voice message left with a call back number. Micheal Leblanc is showing past due on Atorvastatin 20 mg and Olmesartan/amlodipine/Hctz 40/5/25 mg under [facility].   [facility] Direct Dial [phone]  Fax [phone] [email or web address]

## 2019-07-03 ENCOUNTER — Ambulatory Visit (HOSPITAL_COMMUNITY)
Admission: EM | Admit: 2019-07-03 | Discharge: 2019-07-03 | Disposition: A | Discharge status: Home / Self Care | Payer: Medicare Other | Attending: Internal Medicine | Admitting: Internal Medicine

## 2019-07-03 ENCOUNTER — Other Ambulatory Visit: Payer: Self-pay

## 2019-07-03 ENCOUNTER — Encounter (HOSPITAL_COMMUNITY): Payer: Self-pay | Admitting: Emergency Medicine

## 2019-07-03 DIAGNOSIS — Z20822 Contact with and (suspected) exposure to covid-19: Secondary | ICD-10-CM | POA: Insufficient documentation

## 2019-07-03 NOTE — ED Notes (Signed)
Specimen in lab

## 2019-07-03 NOTE — Discharge Instructions (Signed)
COVID swab sent for testing Make sure you take precautions until then.  We will call you if COVID  is positive.

## 2019-07-03 NOTE — ED Triage Notes (Addendum)
06/29/2019 was around someone that has tested positive for covid.  Patient reports not much appetite since 06/30/2018  Needs work note

## 2019-07-03 NOTE — ED Provider Notes (Signed)
[facility]    CSN: [id number] Arrival date & time: 07/03/19  0809      History   Chief Complaint Chief Complaint  Patient presents with  . Labs Only    HPI Micheal Leblanc is a 69 y.o. male.   Patient is 71-year male presents today wanting Covid testing.  Reporting that he was exposed approximately 4 5 days ago.  Denies any current symptoms.     Past Medical History:  Diagnosis Date  . Allergy   . Diabetes mellitus   . Diverticulitis   . ED (erectile dysfunction)   . Hx of colonic polyps   . Hyperlipidemia   . Hypertension   . Smoker     Patient Active Problem List   Diagnosis Date Noted  . Hyperlipidemia associated with type 2 diabetes mellitus ([place]) 04/17/2014  . Arthritis 08/26/2011  . Smoker 12/16/2010  . Diverticulosis 12/16/2010  . Colonic polyp 12/16/2010  . 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 Hypertension complicating diabetes ([place]) 12/16/2010  . Diabetes mellitus type 2, uncontrolled, with complications ([place]) [id number]    Past Surgical History:  Procedure Laterality Date  . COLONOSCOPY    . POLYPECTOMY         Home Medications    Prior to Admission medications   Medication Sig Start Date End Date Taking? Authorizing Provider  atorvastatin (LIPITOR) 20 MG tablet Take 1 tablet (20 mg total) by mouth daily. 08/06/18   Micheal Galas, MD  erythromycin ophthalmic ointment Place 1 application into the right eye daily. 08/06/18   Micheal Picket, MD  glucose blood (ONE TOUCH ULTRA TEST) test strip Patient is to test BID DX: E11.9 12/12/18   Wieters, Hallie C, PA-Leblanc  Insulin Glargine (LANTUS SOLOSTAR) 100 UNIT/ML Solostar Pen Inject 40 Units into the skin daily. 12/12/18   Wieters, Hallie C, PA-Leblanc  Insulin Pen Needle (PEN NEEDLES) 31G X 8 MM MISC Use this for lantus solorstar pen 12/12/18   Wieters, [employer] C, PA-Leblanc  metFORMIN (GLUCOPHAGE-XR) 750 MG 24 hr tablet Take 1 tablet (750 mg total) by mouth 2 (two) times daily. 08/06/18   Micheal Picket,  MD  naproxen (NAPROSYN) 375 MG tablet Take 1 tablet (375 mg total) by mouth 2 (two) times daily. 12/12/18   Wieters, Hallie C, PA-Leblanc  Olmesartan-amLODIPine-HCTZ (TRIBENZOR) 40-5-25 MG TABS Take 1 tablet by mouth daily. 08/06/18   Micheal Galas, MD    Family History Family History  Problem Relation Age of Onset  . Arthritis Mother   . Colon cancer Neg Hx   . Rectal cancer Neg Hx   . Stomach cancer Neg Hx   . Colon polyps Neg Hx   . Diabetes Neg Hx     Social History Social History   Tobacco Use  . Smoking status: Current Every Day Smoker    Packs/day: 0.50  . Smokeless tobacco: Never Used  . Tobacco comment: trying to quit - down to 20 a day   Substance Use Topics  . Alcohol use: Yes    Alcohol/week: 4.0 standard drinks    Types: 2 Cans of beer, 2 Shots of liquor per week  . Drug use: No     Allergies   Patient has no known allergies.   Review of Systems Review of Systems  Constitutional: Negative for chills and fever.  HENT: Negative for ear pain and sore throat.   Eyes: Negative for pain and visual disturbance.  Respiratory: Negative for cough and shortness of breath.  Cardiovascular: Negative for chest pain and palpitations.  Gastrointestinal: Negative for abdominal pain and vomiting.  Genitourinary: Negative for dysuria and hematuria.  Musculoskeletal: Negative for arthralgias and back pain.  Skin: Negative for color change and rash.  Neurological: Negative for seizures and syncope.  All other systems reviewed and are negative.    Physical Exam Triage Vital Signs ED Triage Vitals  Enc Vitals Group     BP 07/03/19 0859 (!) 155/85     Pulse Rate 07/03/19 0859 62     Resp 07/03/19 0859 20     Temp 07/03/19 0859 98 F (36.7 Leblanc)     Temp Source 07/03/19 0859 Oral     SpO2 07/03/19 0859 97 %     Weight --      Height --      Head Circumference --      Peak Flow --      Pain Score 07/03/19 0856 0     Pain Loc --      Pain Edu? --      Excl. in [place]? --     No data found.  Updated Vital Signs BP (!) 155/85 (BP Location: Left Arm)   Pulse 62   Temp 98 F (36.7 Leblanc) (Oral)   Resp 20   SpO2 97%   Visual Acuity Right Eye Distance:   Left Eye Distance:   Bilateral Distance:    Right Eye Near:   Left Eye Near:    Bilateral Near:     Physical Exam   UC Treatments / Results  Labs (all labs ordered are listed, but only abnormal results are displayed) Labs Reviewed  NOVEL CORONAVIRUS, NAA (HOSP ORDER, SEND-OUT TO REF LAB; TAT 18-24 HRS)    EKG   Radiology No results found.  Procedures Procedures (including critical care time)  Medications Ordered in UC Medications - No data to display  Initial Impression / Assessment and Plan / UC Course  I have reviewed the triage vital signs and the nursing notes.  Pertinent labs & imaging results that were available during my care of the patient were reviewed by me and considered in my medical decision making (see chart for details).     Exposure to Covid-swab sent for testing with labs pending. Quarantine precautions given Work note given Final Clinical Impressions(s) / UC Diagnoses   Final diagnoses:  Exposure to COVID-19 virus     Discharge Instructions     COVID swab sent for testing Make sure you take precautions until then.  We will call you if COVID  is positive.      ED Prescriptions    None     PDMP not reviewed this encounter.   Micheal July, NP 07/03/19 1005

## 2019-07-04 LAB — NOVEL CORONAVIRUS, NAA (HOSP ORDER, SEND-OUT TO REF LAB; TAT 18-24 HRS): SARS-CoV-2, NAA: NOT DETECTED

## 2019-08-13 ENCOUNTER — Encounter (HOSPITAL_COMMUNITY): Payer: Self-pay

## 2019-08-13 ENCOUNTER — Other Ambulatory Visit: Payer: Self-pay

## 2019-08-13 ENCOUNTER — Ambulatory Visit (HOSPITAL_COMMUNITY)
Admission: EM | Admit: 2019-08-13 | Discharge: 2019-08-13 | Disposition: A | Discharge status: Home / Self Care | Payer: Medicare Other | Attending: Family Medicine | Admitting: Family Medicine

## 2019-08-13 DIAGNOSIS — E1165 Type 2 diabetes mellitus with hyperglycemia: Secondary | ICD-10-CM

## 2019-08-13 DIAGNOSIS — E118 Type 2 diabetes mellitus with unspecified complications: Secondary | ICD-10-CM | POA: Diagnosis not present

## 2019-08-13 DIAGNOSIS — I152 Hypertension secondary to endocrine disorders: Secondary | ICD-10-CM

## 2019-08-13 DIAGNOSIS — E1159 Type 2 diabetes mellitus with other circulatory complications: Secondary | ICD-10-CM

## 2019-08-13 DIAGNOSIS — IMO0002 Reserved for concepts with insufficient information to code with codable children: Secondary | ICD-10-CM

## 2019-08-13 DIAGNOSIS — E1169 Type 2 diabetes mellitus with other specified complication: Secondary | ICD-10-CM

## 2019-08-13 MED ORDER — OLMESARTAN-AMLODIPINE-HCTZ 40-5-25 MG PO TABS: 1.0000 | ORAL_TABLET | Freq: Every day | ORAL | 0 refills | Status: DC

## 2019-08-13 MED ORDER — LANTUS SOLOSTAR 100 UNIT/ML ~~LOC~~ SOPN: 40.0000 [IU] | PEN_INJECTOR | Freq: Every day | SUBCUTANEOUS | 0 refills | Status: DC

## 2019-08-13 MED ORDER — ATORVASTATIN CALCIUM 20 MG PO TABS: 20.0000 mg | ORAL_TABLET | Freq: Every day | ORAL | 0 refills | Status: DC

## 2019-08-13 MED ORDER — METFORMIN HCL ER 750 MG PO TB24: 750.0000 mg | ORAL_TABLET | Freq: Two times a day (BID) | ORAL | 0 refills | Status: DC

## 2019-08-13 MED ORDER — GLUCOSE BLOOD VI STRP: ORAL_STRIP | 0 refills | Status: DC

## 2019-08-13 NOTE — ED Provider Notes (Signed)
[facility]   [id number] 08/13/19 Arrival Time: [id number]  ASSESSMENT & PLAN:  1. Diabetes mellitus type 2, uncontrolled, with complications ([place])   2. Uncontrolled type 2 diabetes mellitus with complication, with long-term current use of insulin ([place])   3. Hyperlipidemia associated with type 2 diabetes mellitus ([place])   4. Hypertension complicating diabetes ([place])      Meds ordered this encounter  Medications  . metFORMIN (GLUCOPHAGE-XR) 750 MG 24 hr tablet    Sig: Take 1 tablet (750 mg total) by mouth 2 (two) times daily.    Dispense:  120 tablet    Refill:  0  . Insulin Glargine (LANTUS SOLOSTAR) 100 UNIT/ML Solostar Pen    Sig: Inject 40 Units into the skin daily.    Dispense:  15 mL    Refill:  0  . glucose blood (ONE TOUCH ULTRA TEST) test strip    Sig: Patient is to test BID DX: E11.9    Dispense:  100 each    Refill:  0  . atorvastatin (LIPITOR) 20 MG tablet    Sig: Take 1 tablet (20 mg total) by mouth daily.    Dispense:  60 tablet    Refill:  0  . Olmesartan-amLODIPine-HCTZ (TRIBENZOR) 40-5-25 MG TABS    Sig: Take 1 tablet by mouth daily.    Dispense:  60 tablet    Refill:  0    Reviewed expectations re: course of current medical issues. Questions answered. Outlined signs and symptoms indicating need for more acute intervention. Patient verbalized understanding. After Visit Summary given.   SUBJECTIVE: History from: patient. Micheal Leblanc is a 69 y.o. male who presents requesting medication refill. Almost out of medications. Owes money to his PCP and cannot see them until he pays. No current concerns. Feeling well. Reports no significant exacerbations of his current medical problems.  Current medical problems include: Past Medical History:  Diagnosis Date  . Allergy   . Diabetes mellitus   . Diverticulitis   . ED (erectile dysfunction)   . Hx of colonic polyps   . Hyperlipidemia   . Hypertension   . Smoker       Current  Facility-Administered Medications:  .  0.9 %  sodium chloride infusion, 500 mL, Intravenous, Once, Milus Banister, MD  Current Outpatient Medications:  .  atorvastatin (LIPITOR) 20 MG tablet, Take 1 tablet (20 mg total) by mouth daily., Disp: 60 tablet, Rfl: 0 .  erythromycin ophthalmic ointment, Place 1 application into the right eye daily., Disp: 3.5 g, Rfl: 0 .  glucose blood (ONE TOUCH ULTRA TEST) test strip, Patient is to test BID DX: E11.9, Disp: 100 each, Rfl: 0 .  Insulin Glargine (LANTUS SOLOSTAR) 100 UNIT/ML Solostar Pen, Inject 40 Units into the skin daily., Disp: 15 mL, Rfl: 0 .  Insulin Pen Needle (PEN NEEDLES) 31G X 8 MM MISC, Use this for lantus solorstar pen, Disp: 100 each, Rfl: 1 .  metFORMIN (GLUCOPHAGE-XR) 750 MG 24 hr tablet, Take 1 tablet (750 mg total) by mouth 2 (two) times daily., Disp: 120 tablet, Rfl: 0 .  naproxen (NAPROSYN) 375 MG tablet, Take 1 tablet (375 mg total) by mouth 2 (two) times daily., Disp: 20 tablet, Rfl: 0 .  Olmesartan-amLODIPine-HCTZ (TRIBENZOR) 40-5-25 MG TABS, Take 1 tablet by mouth daily., Disp: 60 tablet, Rfl: 0    OBJECTIVE:  Vitals:   08/13/19 0937 08/13/19 0938  BP:  137/86  Pulse:  80  Resp:  18  Temp:  98.4  F (36.9 C)  SpO2:  99%  Weight: 81.6 kg     General appearance: alert; no distress Eyes: PERRLA; EOMI; conjunctiva normal Lungs: speaks full sentences without difficulty; unlabored CV: regular Abdomen: soft Skin: warm and dry Neurologic: normal gait; normal symmetric reflexes Psychological: alert and cooperative; normal mood and affect  No Known Allergies  Social History   Socioeconomic History  . Marital status: Single    Spouse name: Not on file  . Number of children: Not on file  . Years of education: Not on file  . Highest education level: Not on file  Occupational History  . Not on file  Tobacco Use  . Smoking status: Current Every Day Smoker    Packs/day: 0.50  . Smokeless tobacco: Never Used  .  Tobacco comment: trying to quit - down to 20 a day   Substance and Sexual Activity  . Alcohol use: Yes    Alcohol/week: 4.0 standard drinks    Types: 2 Cans of beer, 2 Shots of liquor per week  . Drug use: No  . Sexual activity: Not Currently  Other Topics Concern  . Not on file  Social History Narrative  . Not on file   Social Determinants of Health   Financial Resource Strain:   . Difficulty of Paying Living Expenses: Not on file  Food Insecurity:   . Worried About Charity fundraiser in the Last Year: Not on file  . Ran Out of Food in the Last Year: Not on file  Transportation Needs:   . Lack of Transportation (Medical): Not on file  . Lack of Transportation (Non-Medical): Not on file  Physical Activity:   . Days of Exercise per Week: Not on file  . Minutes of Exercise per Session: Not on file  Stress:   . Feeling of Stress : Not on file  Social Connections:   . Frequency of Communication with Friends and Family: Not on file  . Frequency of Social Gatherings with Friends and Family: Not on file  . Attends Religious Services: Not on file  . Active Member of Clubs or Organizations: Not on file  . Attends Archivist Meetings: Not on file  . Marital Status: Not on file  Intimate Partner Violence:   . Fear of Current or Ex-Partner: Not on file  . Emotionally Abused: Not on file  . Physically Abused: Not on file  . Sexually Abused: Not on file   Family History  Problem Relation Age of Onset  . Arthritis Mother   . Colon cancer Neg Hx   . Rectal cancer Neg Hx   . Stomach cancer Neg Hx   . Colon polyps Neg Hx   . Diabetes Neg Hx    Past Surgical History:  Procedure Laterality Date  . COLONOSCOPY    . POLYPECTOMY       Vanessa Kick, MD 08/13/19 1028

## 2019-08-13 NOTE — ED Triage Notes (Signed)
Pt states he need his diabetic meds refilled. Pt state she the last of it today.

## 2019-11-13 ENCOUNTER — Encounter (HOSPITAL_COMMUNITY): Payer: Self-pay

## 2019-11-13 ENCOUNTER — Ambulatory Visit (HOSPITAL_COMMUNITY)
Admission: EM | Admit: 2019-11-13 | Discharge: 2019-11-13 | Disposition: A | Discharge status: Home / Self Care | Payer: Medicare Other | Attending: Physician Assistant | Admitting: Physician Assistant

## 2019-11-13 ENCOUNTER — Other Ambulatory Visit: Payer: Self-pay

## 2019-11-13 DIAGNOSIS — Z760 Encounter for issue of repeat prescription: Secondary | ICD-10-CM

## 2019-11-13 DIAGNOSIS — E1165 Type 2 diabetes mellitus with hyperglycemia: Secondary | ICD-10-CM | POA: Diagnosis not present

## 2019-11-13 LAB — BASIC METABOLIC PANEL
Anion gap: 10 (ref 5–15)
BUN: 15 mg/dL (ref 8–23)
CO2: 25 mmol/L (ref 22–32)
Calcium: 9.7 mg/dL (ref 8.9–10.3)
Chloride: 103 mmol/L (ref 98–111)
Creatinine, Ser: 1.03 mg/dL (ref 0.61–1.24)
GFR calc Af Amer: >60 mL/min (ref >60)
GFR calc non Af Amer: >60 mL/min (ref >60)
Glucose, Bld: 146 mg/dL — ABNORMAL HIGH (ref 70–99)
Potassium: 4 mmol/L (ref 3.5–5.1)
Sodium: 138 mmol/L (ref 135–145)

## 2019-11-13 MED ORDER — GLUCOSE BLOOD VI STRP: ORAL_STRIP | 0 refills | Status: DC

## 2019-11-13 MED ORDER — LANTUS SOLOSTAR 100 UNIT/ML ~~LOC~~ SOPN: 40.0000 [IU] | PEN_INJECTOR | Freq: Every day | SUBCUTANEOUS | 0 refills | Status: DC

## 2019-11-13 NOTE — Discharge Instructions (Signed)
I have refilled your insulin, continue this at 40U once daily  Please follow up with your primary care or establish with one of the options supplied  I have sent basic labs and will notify you of any results requiring immediate attention  We discussed signs of low blood sugar, please monitor for these.

## 2019-11-13 NOTE — ED Provider Notes (Signed)
[place]    CSN: [id number] Arrival date & time: 11/13/19  [id number]      History   Chief Complaint Chief Complaint  Patient presents with  . med refill    HPI Micheal Leblanc is a 69 y.o. male.   Patient with known history of uncontrolled diabetes on insulin reports for medication refill.  He reports he has been out of his insulin glargine for 2 days.  He reports he cannot see his primary care as he owes them money this is been ongoing issue.  He reports he has been using the insulin glargine about every other day.  Reports sugars around 180 when he checks it.  He reports he has plenty of his other medications.  Denies any issues with hypoglycemia such that he does not feel lightheaded, get sweaty or feel like you pass out.     Past Medical History:  Diagnosis Date  . Allergy   . Diabetes mellitus   . Diverticulitis   . ED (erectile dysfunction)   . Hx of colonic polyps   . Hyperlipidemia   . Hypertension   . Smoker     Patient Active Problem List   Diagnosis Date Noted  . Hyperlipidemia associated with type 2 diabetes mellitus ([place]) 04/17/2014  . Arthritis 08/26/2011  . Smoker 12/16/2010  . Diverticulosis 12/16/2010  . Colonic polyp 12/16/2010  . 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 Hypertension complicating diabetes ([place]) 12/16/2010  . Diabetes mellitus type 2, uncontrolled, with complications ([place]) [id number]    Past Surgical History:  Procedure Laterality Date  . COLONOSCOPY    . POLYPECTOMY         Home Medications    Prior to Admission medications   Medication Sig Start Date End Date Taking? Authorizing Provider  atorvastatin (LIPITOR) 20 MG tablet Take 1 tablet (20 mg total) by mouth daily. 08/13/19   Vanessa Kick, MD  erythromycin ophthalmic ointment Place 1 application into the right eye daily. 08/06/18   Chase Picket, MD  glucose blood (ONE TOUCH ULTRA TEST) test strip Patient is to test BID DX: E11.9 11/13/19   Sui Kasparek, Marguerita Beards, PA-C    insulin glargine (LANTUS SOLOSTAR) 100 UNIT/ML Solostar Pen Inject 40 Units into the skin daily. 11/13/19 12/21/19  Maikel Neisler, Marguerita Beards, PA-C  Insulin Pen Needle (PEN NEEDLES) 31G X 8 MM MISC Use this for lantus solorstar pen 12/12/18   Wieters, [employer] C, PA-C  metFORMIN (GLUCOPHAGE-XR) 750 MG 24 hr tablet Take 1 tablet (750 mg total) by mouth 2 (two) times daily. 08/13/19   Vanessa Kick, MD  naproxen (NAPROSYN) 375 MG tablet Take 1 tablet (375 mg total) by mouth 2 (two) times daily. 12/12/18   Wieters, Hallie C, PA-C  Olmesartan-amLODIPine-HCTZ (TRIBENZOR) 40-5-25 MG TABS Take 1 tablet by mouth daily. 08/13/19   Vanessa Kick, MD    Family History Family History  Problem Relation Age of Onset  . Arthritis Mother   . Colon cancer Neg Hx   . Rectal cancer Neg Hx   . Stomach cancer Neg Hx   . Colon polyps Neg Hx   . Diabetes Neg Hx     Social History Social History   Tobacco Use  . Smoking status: Current Every Day Smoker    Packs/day: 0.50  . Smokeless tobacco: Never Used  . Tobacco comment: trying to quit - down to 20 a day   Substance Use Topics  . Alcohol use: Yes    Alcohol/week: 4.0 standard drinks  Types: 2 Cans of beer, 2 Shots of liquor per week  . Drug use: No     Allergies   Patient has no known allergies.   Review of Systems Review of Systems   Physical Exam Triage Vital Signs ED Triage Vitals [11/13/19 0850]  Enc Vitals Group     BP (!) 146/98     Pulse Rate 66     Resp 16     Temp 98.2 F (36.8 C)     Temp Source Oral     SpO2 100 %     Weight 170 lb (77.1 kg)     Height 5\' 11"  (1.803 m)     Head Circumference      Peak Flow      Pain Score 0     Pain Loc      Pain Edu?      Excl. in [place]?    No data found.  Updated Vital Signs BP (!) 146/98   Pulse 66   Temp 98.2 F (36.8 C) (Oral)   Resp 16   Ht 5\' 11"  (1.803 m)   Wt 170 lb (77.1 kg)   SpO2 100%   BMI 23.71 kg/m   Visual Acuity Right Eye Distance:   Left Eye Distance:   Bilateral  Distance:    Right Eye Near:   Left Eye Near:    Bilateral Near:     Physical Exam Vitals and nursing note reviewed.  Constitutional:      Appearance: He is well-developed.  HENT:     Head: Normocephalic and atraumatic.  Eyes:     Conjunctiva/sclera: Conjunctivae normal.  Cardiovascular:     Rate and Rhythm: Normal rate and regular rhythm.     Heart sounds: No murmur.  Pulmonary:     Effort: Pulmonary effort is normal. No respiratory distress.     Breath sounds: Normal breath sounds.  Abdominal:     Palpations: Abdomen is soft.     Tenderness: There is no abdominal tenderness.  Musculoskeletal:     Cervical back: Neck supple.  Skin:    General: Skin is warm and dry.  Neurological:     Mental Status: He is alert.      UC Treatments / Results  Labs (all labs ordered are listed, but only abnormal results are displayed) Labs Reviewed  BASIC METABOLIC PANEL - Abnormal; Notable for the following components:      Result Value   Glucose, Bld 146 (*)    All other components within normal limits  HEMOGLOBIN A1C    EKG   Radiology No results found.  Procedures Procedures (including critical care time)  Medications Ordered in UC Medications - No data to display  Initial Impression / Assessment and Plan / UC Course  I have reviewed the triage vital signs and the nursing notes.  Pertinent labs & imaging results that were available during my care of the patient were reviewed by me and considered in my medical decision making (see chart for details).     #Uncontrolled diabetes #Medication refill Patient is a 69 year old with poorly controlled type 2 diabetes in need of insulin refill.  Discussed that we can refill his insulin however he needs to ensure he has primary care follow-up.  Discussed that he should reengage with his primary care to discuss follow-up or seek other primary care.  BMP within normal limits.  Discussed signs of hypoglycemia.  Patient verbalized  understanding. -Refilled insulin glargine at 40 units daily.  Chart review shows that he has been on this dosing for quite some time. Final Clinical Impressions(s) / UC Diagnoses   Final diagnoses:  Uncontrolled type 2 diabetes mellitus with hyperglycemia ([place])  Encounter for medication refill     Discharge Instructions     I have refilled your insulin, continue this at 40U once daily  Please follow up with your primary care or establish with one of the options supplied  I have sent basic labs and will notify you of any results requiring immediate attention  We discussed signs of low blood sugar, please monitor for these.       ED Prescriptions    Medication Sig Dispense Auth. Provider   insulin glargine (LANTUS SOLOSTAR) 100 UNIT/ML Solostar Pen Inject 40 Units into the skin daily. 15 mL Tyeesha Riker, Edison Nasuti E, PA-C   glucose blood (ONE TOUCH ULTRA TEST) test strip Patient is to test BID DX: E11.9 100 each Chaquita Basques, Marguerita Beards, PA-C     PDMP not reviewed this encounter.   Purnell Shoemaker, PA-C 11/13/19 1939

## 2019-11-13 NOTE — ED Triage Notes (Signed)
Pt needs a medication refill lantus insulin and test strips. Pt states can't see PCP until he pays them the money he owes them.

## 2019-11-14 LAB — HEMOGLOBIN A1C
Hgb A1c MFr Bld: 8.8 % — ABNORMAL HIGH (ref 4.8–5.6)
Mean Plasma Glucose: 206 mg/dL

## 2020-01-22 ENCOUNTER — Encounter (HOSPITAL_COMMUNITY): Payer: Self-pay

## 2020-01-22 ENCOUNTER — Ambulatory Visit (HOSPITAL_COMMUNITY)
Admission: EM | Admit: 2020-01-22 | Discharge: 2020-01-22 | Disposition: A | Discharge status: Home / Self Care | Payer: Medicare Other | Attending: Family Medicine | Admitting: Family Medicine

## 2020-01-22 ENCOUNTER — Other Ambulatory Visit: Payer: Self-pay

## 2020-01-22 ENCOUNTER — Ambulatory Visit (INDEPENDENT_AMBULATORY_CARE_PROVIDER_SITE_OTHER): Payer: Medicare Other

## 2020-01-22 DIAGNOSIS — R05 Cough: Secondary | ICD-10-CM | POA: Diagnosis not present

## 2020-01-22 DIAGNOSIS — I10 Essential (primary) hypertension: Secondary | ICD-10-CM | POA: Insufficient documentation

## 2020-01-22 DIAGNOSIS — Z791 Long term (current) use of non-steroidal anti-inflammatories (NSAID): Secondary | ICD-10-CM | POA: Insufficient documentation

## 2020-01-22 DIAGNOSIS — Z79899 Other long term (current) drug therapy: Secondary | ICD-10-CM | POA: Insufficient documentation

## 2020-01-22 DIAGNOSIS — F17210 Nicotine dependence, cigarettes, uncomplicated: Secondary | ICD-10-CM | POA: Insufficient documentation

## 2020-01-22 DIAGNOSIS — E119 Type 2 diabetes mellitus without complications: Secondary | ICD-10-CM | POA: Insufficient documentation

## 2020-01-22 DIAGNOSIS — Z7952 Long term (current) use of systemic steroids: Secondary | ICD-10-CM | POA: Diagnosis not present

## 2020-01-22 DIAGNOSIS — E785 Hyperlipidemia, unspecified: Secondary | ICD-10-CM | POA: Diagnosis not present

## 2020-01-22 DIAGNOSIS — Z20822 Contact with and (suspected) exposure to covid-19: Secondary | ICD-10-CM | POA: Diagnosis not present

## 2020-01-22 DIAGNOSIS — Z794 Long term (current) use of insulin: Secondary | ICD-10-CM | POA: Diagnosis not present

## 2020-01-22 DIAGNOSIS — Z8719 Personal history of other diseases of the digestive system: Secondary | ICD-10-CM | POA: Insufficient documentation

## 2020-01-22 DIAGNOSIS — J40 Bronchitis, not specified as acute or chronic: Secondary | ICD-10-CM | POA: Diagnosis not present

## 2020-01-22 LAB — SARS CORONAVIRUS 2 (TAT 6-24 HRS): SARS Coronavirus 2: NEGATIVE

## 2020-01-22 MED ORDER — ALBUTEROL SULFATE HFA 108 (90 BASE) MCG/ACT IN AERS: 1.0000 | INHALATION_SPRAY | Freq: Four times a day (QID) | RESPIRATORY_TRACT | 0 refills | Status: DC | PRN

## 2020-01-22 MED ORDER — AEROCHAMBER PLUS FLO-VU SMALL MISC
Status: AC
  Filled 2020-01-22: qty 1

## 2020-01-22 MED ORDER — GUAIFENESIN ER 600 MG PO TB12: 600.0000 mg | ORAL_TABLET | Freq: Two times a day (BID) | ORAL | 0 refills | Status: AC

## 2020-01-22 MED ORDER — AZITHROMYCIN 250 MG PO TABS: ORAL_TABLET | ORAL | 0 refills | Status: DC

## 2020-01-22 MED ORDER — BENZONATATE 100 MG PO CAPS: 100.0000 mg | ORAL_CAPSULE | Freq: Three times a day (TID) | ORAL | 0 refills | Status: DC

## 2020-01-22 MED ORDER — PREDNISONE 10 MG PO TABS: 40.0000 mg | ORAL_TABLET | Freq: Every day | ORAL | 0 refills | Status: AC

## 2020-01-22 MED ORDER — ALBUTEROL SULFATE HFA 108 (90 BASE) MCG/ACT IN AERS
INHALATION_SPRAY | RESPIRATORY_TRACT | Status: AC
  Filled 2020-01-22: qty 6.7

## 2020-01-22 NOTE — ED Triage Notes (Signed)
Pt presents to UC for cough and congestion x 4 days. Pt also c/o rib and upper stomach pain r/t coughing. Pt states he can't get his chest congestion up. Pt endorsing fever, loss of appetite, and fatigue. Pt denies sore throat, n/v/d, loss of taste or smell.

## 2020-01-22 NOTE — ED Provider Notes (Signed)
[place]    CSN: [id number] Arrival date & time: 01/22/20  1616      History   Chief Complaint Chief Complaint  Patient presents with  . Cough    HPI Micheal Leblanc is a 69 y.o. male.   Patient presents to urgent care for evaluation of cough and congestion for 4 days.  He reports around 4 days ago he started coughing quite a bit.  The cough is generated chest discomfort and upper abdominal pain.  Denies chest discomfort or abdominal pain outside of coughing.  Reports slight shortness of breath.  Cough has been productive with green sputum.  He also endorses feeling slight fever, no measured at home.  Decreased appetite.  Reports he is a 1-1/2 pack/day smoker since he was young however stopped wanting to smoke Saturday due to how he felt.  Endorses loss of appetite.  Denies sore throat, nausea vomiting or diarrhea.  Denies change in taste or smell.  No sick contacts.  No known lung disease       Past Medical History:  Diagnosis Date  . Allergy   . Diabetes mellitus   . Diverticulitis   . ED (erectile dysfunction)   . Hx of colonic polyps   . Hyperlipidemia   . Hypertension   . Smoker     Patient Active Problem List   Diagnosis Date Noted  . Hyperlipidemia associated with type 2 diabetes mellitus ([place]) 04/17/2014  . Arthritis 08/26/2011  . Smoker 12/16/2010  . Diverticulosis 12/16/2010  . Colonic polyp 12/16/2010  . 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 Hypertension complicating diabetes ([place]) 12/16/2010  . Diabetes mellitus type 2, uncontrolled, with complications ([place]) 38/03/1750    Past Surgical History:  Procedure Laterality Date  . COLONOSCOPY    . POLYPECTOMY         Home Medications    Prior to Admission medications   Medication Sig Start Date End Date Taking? Authorizing Provider  albuterol (VENTOLIN HFA) 108 (90 Base) MCG/ACT inhaler Inhale 1-2 puffs into the lungs every 6 (six) hours as needed for wheezing or shortness of breath.  01/22/20   Spence Soberano, Marguerita Beards, PA-C  atorvastatin (LIPITOR) 20 MG tablet Take 1 tablet (20 mg total) by mouth daily. 08/13/19   Vanessa Kick, MD  azithromycin (ZITHROMAX Z-PAK) 250 MG tablet Take 2 tablets day 1, then 1 tablet days 2-5 01/22/20   Shoshana Johal, Marguerita Beards, PA-C  benzonatate (TESSALON) 100 MG capsule Take 1 capsule (100 mg total) by mouth every 8 (eight) hours. 01/22/20   Michael Walrath, Marguerita Beards, PA-C  erythromycin ophthalmic ointment Place 1 application into the right eye daily. 08/06/18   Chase Picket, MD  glucose blood (ONE TOUCH ULTRA TEST) test strip Patient is to test BID DX: E11.9 11/13/19   Sylvanna Burggraf, Marguerita Beards, PA-C  guaiFENesin (MUCINEX) 600 MG 12 hr tablet Take 1 tablet (600 mg total) by mouth 2 (two) times daily for 7 days. 01/22/20 01/29/20  Quashon Jesus, Marguerita Beards, PA-C  insulin glargine (LANTUS SOLOSTAR) 100 UNIT/ML Solostar Pen Inject 40 Units into the skin daily. 11/13/19 12/21/19  Sahiba Granholm, Marguerita Beards, PA-C  Insulin Pen Needle (PEN NEEDLES) 31G X 8 MM MISC Use this for lantus solorstar pen 12/12/18   Wieters, [employer] C, PA-C  metFORMIN (GLUCOPHAGE-XR) 750 MG 24 hr tablet Take 1 tablet (750 mg total) by mouth 2 (two) times daily. 08/13/19   Vanessa Kick, MD  naproxen (NAPROSYN) 375 MG tablet Take 1 tablet (375 mg total) by mouth 2 (  two) times daily. 12/12/18   Wieters, Hallie C, PA-C  Olmesartan-amLODIPine-HCTZ (TRIBENZOR) 40-5-25 MG TABS Take 1 tablet by mouth daily. 08/13/19   Vanessa Kick, MD  predniSONE (DELTASONE) 10 MG tablet Take 4 tablets (40 mg total) by mouth daily for 5 days. 01/22/20 01/27/20  Trai Ells, Marguerita Beards, PA-C    Family History Family History  Problem Relation Age of Onset  . Arthritis Mother   . Colon cancer Neg Hx   . Rectal cancer Neg Hx   . Stomach cancer Neg Hx   . Colon polyps Neg Hx   . Diabetes Neg Hx     Social History Social History   Tobacco Use  . Smoking status: Current Every Day Smoker    Packs/day: 0.50  . Smokeless tobacco: Never Used  . Tobacco comment: trying to quit - down to 20  a day   Substance Use Topics  . Alcohol use: Yes    Alcohol/week: 4.0 standard drinks    Types: 2 Cans of beer, 2 Shots of liquor per week  . Drug use: No     Allergies   Patient has no known allergies.   Review of Systems Review of Systems   Physical Exam Triage Vital Signs ED Triage Vitals  Enc Vitals Group     BP 01/22/20 1740 122/71     Pulse Rate 01/22/20 1740 61     Resp 01/22/20 1740 16     Temp 01/22/20 1740 98.6 F (37 C)     Temp Source 01/22/20 1740 Oral     SpO2 01/22/20 1740 95 %     Weight --      Height --      Head Circumference --      Peak Flow --      Pain Score 01/22/20 1741 0     Pain Loc --      Pain Edu? --      Excl. in [place]? --    No data found.  Updated Vital Signs BP 122/71 (BP Location: Right Arm)   Pulse 61   Temp 98.6 F (37 C) (Oral)   Resp 16   SpO2 95%   Visual Acuity Right Eye Distance:   Left Eye Distance:   Bilateral Distance:    Right Eye Near:   Left Eye Near:    Bilateral Near:     Physical Exam Vitals and nursing note reviewed.  Constitutional:      General: He is not in acute distress.    Appearance: Normal appearance. He is well-developed. He is not ill-appearing.  HENT:     Head: Normocephalic and atraumatic.     Mouth/Throat:     Mouth: Mucous membranes are moist.     Comments: Postnasal drip visible Eyes:     Extraocular Movements: Extraocular movements intact.     Conjunctiva/sclera: Conjunctivae normal.     Pupils: Pupils are equal, round, and reactive to light.  Cardiovascular:     Rate and Rhythm: Normal rate and regular rhythm.     Heart sounds: No murmur heard.   Pulmonary:     Effort: Pulmonary effort is normal. No respiratory distress.     Breath sounds: Rhonchi (Coarse rhonchi throughout fields) present. No wheezing or rales.     Comments: Moving air fairly well despite coarse rhonchi.  Saturating 95-96 % on room air.  Speaking in clear fluid sentences. Abdominal:     Palpations: Abdomen  is soft.     Tenderness: There is  no abdominal tenderness.  Musculoskeletal:     Cervical back: Neck supple.     Right lower leg: No edema.     Left lower leg: No edema.  Skin:    General: Skin is warm and dry.  Neurological:     General: No focal deficit present.     Mental Status: He is alert and oriented to person, place, and time.      UC Treatments / Results  Labs (all labs ordered are listed, but only abnormal results are displayed) Labs Reviewed  SARS CORONAVIRUS 2 (TAT 6-24 HRS)    EKG   Radiology DG Chest 2 View  Result Date: 01/22/2020 CLINICAL DATA:  Productive cough EXAM: CHEST - 2 VIEW COMPARISON:  None. FINDINGS: The heart size and mediastinal contours are within normal limits. Both lungs are clear. The visualized skeletal structures are unremarkable. IMPRESSION: No active cardiopulmonary disease. Electronically Signed   By: Inez Catalina M.D.   On: 01/22/2020 19:20    Procedures Procedures (including critical care time)  Medications Ordered in UC Medications - No data to display  Initial Impression / Assessment and Plan / UC Course  I have reviewed the triage vital signs and the nursing notes.  Pertinent labs & imaging results that were available during my care of the patient were reviewed by me and considered in my medical decision making (see chart for details).     #Bronchitis Patient is a 69 year old gentleman with significant history of diabetes and long-term tobacco use.  Chest x-ray does show some evidence of COPD, given significant tobacco history and lung exam, feel this may be beginning of a COPD exacerbation.  He is afebrile with normal vital signs.  Albuterol inhaler given in clinic.  Will discharge with prednisone, azithromycin, Tessalon for cough and Mucinex.  Strict emergency department follow-up precautions were discussed.  Instructed patient needs to establish or follow-up with his primary care soon as possible. -Case discussed patient  examined with attending physician Dr. Mannie Stabile Final Clinical Impressions(s) / UC Diagnoses   Final diagnoses:  Bronchitis     Discharge Instructions     Take the medications as prescribed  Use inhaler every 4 hours while awake for next 24 hours  Call the internal medicine to schedule follow up ASAP  You should also follow up with the pulmonology group  If worsening shortness of breath, chest pain or high fever, go to the Emergency Department  If your Covid-19 test is positive, you will receive a phone call from [facility] regarding your results. Negative test results are not called. Both positive and negative results area always visible on MyChart. If you do not have a MyChart account, sign up instructions are in your discharge papers.   Persons who are directed to care for themselves at home may discontinue isolation under the following conditions:  . At least 10 days have passed since symptom onset and . At least 24 hours have passed without running a fever (this means without the use of fever-reducing medications) and . Other symptoms have improved.  Persons infected with COVID-19 who never develop symptoms may discontinue isolation and other precautions 10 days after the date of their first positive COVID-19 test.       ED Prescriptions    Medication Sig Dispense Auth. Provider   predniSONE (DELTASONE) 10 MG tablet Take 4 tablets (40 mg total) by mouth daily for 5 days. 20 tablet Gissella Niblack, Marguerita Beards, PA-C   guaiFENesin (MUCINEX) 600 MG 12 hr tablet  Take 1 tablet (600 mg total) by mouth 2 (two) times daily for 7 days. 14 tablet Kaymen Adrian, Marguerita Beards, PA-C   benzonatate (TESSALON) 100 MG capsule Take 1 capsule (100 mg total) by mouth every 8 (eight) hours. 21 capsule Noralyn Karim, Marguerita Beards, PA-C   azithromycin (ZITHROMAX Z-PAK) 250 MG tablet Take 2 tablets day 1, then 1 tablet days 2-5 6 tablet Pandora Mccrackin, Marguerita Beards, PA-C   albuterol (VENTOLIN HFA) 108 (90 Base) MCG/ACT inhaler Inhale 1-2 puffs into  the lungs every 6 (six) hours as needed for wheezing or shortness of breath. 18 g Prynce Jacober, Marguerita Beards, PA-C     PDMP not reviewed this encounter.   Purnell Shoemaker, PA-C 01/22/20 2102

## 2020-01-22 NOTE — Discharge Instructions (Signed)
Take the medications as prescribed  Use inhaler every 4 hours while awake for next 24 hours  Call the internal medicine to schedule follow up ASAP  You should also follow up with the pulmonology group  If worsening shortness of breath, chest pain or high fever, go to the Emergency Department  If your Covid-19 test is positive, you will receive a phone call from [facility] regarding your results. Negative test results are not called. Both positive and negative results area always visible on MyChart. If you do not have a MyChart account, sign up instructions are in your discharge papers.   Persons who are directed to care for themselves at home may discontinue isolation under the following conditions:   At least 10 days have passed since symptom onset and  At least 24 hours have passed without running a fever (this means without the use of fever-reducing medications) and  Other symptoms have improved.  Persons infected with COVID-19 who never develop symptoms may discontinue isolation and other precautions 10 days after the date of their first positive COVID-19 test.

## 2020-03-21 ENCOUNTER — Encounter: Payer: Self-pay | Admitting: Emergency Medicine

## 2020-03-21 ENCOUNTER — Ambulatory Visit (INDEPENDENT_AMBULATORY_CARE_PROVIDER_SITE_OTHER): Payer: Medicare Other | Admitting: Emergency Medicine

## 2020-03-21 ENCOUNTER — Other Ambulatory Visit: Payer: Self-pay

## 2020-03-21 DIAGNOSIS — J301 Allergic rhinitis due to pollen: Secondary | ICD-10-CM

## 2020-03-21 DIAGNOSIS — F17210 Nicotine dependence, cigarettes, uncomplicated: Secondary | ICD-10-CM

## 2020-03-21 DIAGNOSIS — J309 Allergic rhinitis, unspecified: Secondary | ICD-10-CM | POA: Insufficient documentation

## 2020-03-21 DIAGNOSIS — F17200 Nicotine dependence, unspecified, uncomplicated: Secondary | ICD-10-CM

## 2020-03-21 NOTE — Progress Notes (Signed)
Subjective:    Patient ID: Micheal Leblanc, male    DOB: 04-11-1951, 69 y.o.   MRN: [id number]  HPI Pleasant 69 year old gentleman, active smoker (30 pack years) with a history of hypertension, diabetes, allergic rhinitis.   He was seen in the urgent care at the end of July with 4 days of congestion, cough. No known sick contacts. The cough was persistent and not characteristic for him. He received prednisone and azithromycin. He improved but had low energy afterwards. He is feeling better. Was given albuterol with this illness. No breathing limitations.    Review of Systems As per HPI  Past Medical History:  Diagnosis Date  . Allergy   . Diabetes mellitus   . Diverticulitis   . ED (erectile dysfunction)   . Hx of colonic polyps   . Hyperlipidemia   . Hypertension   . Smoker      Family History  Problem Relation Age of Onset  . Arthritis Mother   . Colon cancer Neg Hx   . Rectal cancer Neg Hx   . Stomach cancer Neg Hx   . Colon polyps Neg Hx   . Diabetes Neg Hx      Social History   Socioeconomic History  . Marital status: Single    Spouse name: Not on file  . Number of children: Not on file  . Years of education: Not on file  . Highest education level: Not on file  Occupational History  . Not on file  Tobacco Use  . Smoking status: Current Every Day Smoker    Packs/day: 0.50  . Smokeless tobacco: Never Used  . Tobacco comment: 6- 7 cigarettes a day 03/21/20 [facility]  Substance and Sexual Activity  . Alcohol use: Yes    Alcohol/week: 4.0 standard drinks    Types: 2 Cans of beer, 2 Shots of liquor per week  . Drug use: No  . Sexual activity: Not Currently  Other Topics Concern  . Not on file  Social History Narrative  . Not on file   Social Determinants of Health   Financial Resource Strain:   . Difficulty of Paying Living Expenses: Not on file  Food Insecurity:   . Worried About Charity fundraiser in the Last Year: Not on file  . Ran Out of Food in the  Last Year: Not on file  Transportation Needs:   . Lack of Transportation (Medical): Not on file  . Lack of Transportation (Non-Medical): Not on file  Physical Activity:   . Days of Exercise per Week: Not on file  . Minutes of Exercise per Session: Not on file  Stress:   . Feeling of Stress : Not on file  Social Connections:   . Frequency of Communication with Friends and Family: Not on file  . Frequency of Social Gatherings with Friends and Family: Not on file  . Attends Religious Services: Not on file  . Active Member of Clubs or Organizations: Not on file  . Attends Archivist Meetings: Not on file  . Marital Status: Not on file  Intimate Partner Violence:   . Fear of Current or Ex-Partner: Not on file  . Emotionally Abused: Not on file  . Physically Abused: Not on file  . Sexually Abused: Not on file    He still works, builds Patent examiner.  [place] native No military  No Known Allergies   Outpatient Medications Prior to Visit  Medication Sig Dispense Refill  . albuterol (VENTOLIN HFA)  108 (90 Base) MCG/ACT inhaler Inhale 1-2 puffs into the lungs every 6 (six) hours as needed for wheezing or shortness of breath. 18 g 0  . atorvastatin (LIPITOR) 20 MG tablet Take 1 tablet (20 mg total) by mouth daily. 60 tablet 0  . azithromycin (ZITHROMAX Z-PAK) 250 MG tablet Take 2 tablets day 1, then 1 tablet days 2-5 6 tablet 0  . benzonatate (TESSALON) 100 MG capsule Take 1 capsule (100 mg total) by mouth every 8 (eight) hours. 21 capsule 0  . erythromycin ophthalmic ointment Place 1 application into the right eye daily. 3.5 g 0  . glucose blood (ONE TOUCH ULTRA TEST) test strip Patient is to test BID DX: E11.9 100 each 0  . Insulin Pen Needle (PEN NEEDLES) 31G X 8 MM MISC Use this for lantus solorstar pen 100 each 1  . metFORMIN (GLUCOPHAGE-XR) 750 MG 24 hr tablet Take 1 tablet (750 mg total) by mouth 2 (two) times daily. 120 tablet 0  . naproxen (NAPROSYN) 375 MG tablet Take 1  tablet (375 mg total) by mouth 2 (two) times daily. 20 tablet 0  . Olmesartan-amLODIPine-HCTZ (TRIBENZOR) 40-5-25 MG TABS Take 1 tablet by mouth daily. 60 tablet 0  . insulin glargine (LANTUS SOLOSTAR) 100 UNIT/ML Solostar Pen Inject 40 Units into the skin daily. 15 mL 0   Facility-Administered Medications Prior to Visit  Medication Dose Route Frequency Provider Last Rate Last Admin  . 0.9 %  sodium chloride infusion  500 mL Intravenous Once Milus Banister, MD             Objective:   Physical Exam Vitals:   03/21/20 1605  BP: 134/72  Pulse: 74  Temp: 98.3 F (36.8 C)  TempSrc: Temporal  SpO2: 97%  Weight: 167 lb 12.8 oz (76.1 kg)  Height: 5\' 11"  (1.803 m)   Gen: Pleasant, well-nourished, in no distress,  normal affect  ENT: No lesions,  mouth clear,  oropharynx clear, no postnasal drip  Neck: No JVD, no stridor  Lungs: No use of accessory muscles, no crackles or wheezing on normal respiration, no wheeze on forced expiration  Cardiovascular: RRR, heart sounds normal, no murmur or gallops, no peripheral edema  Musculoskeletal: No deformities, no cyanosis or clubbing  Neuro: alert, awake, non focal  Skin: Warm, no lesions or rash      Assessment & Plan:  Smoker I suspect that he does have at least mild COPD although he does not have any overt daily symptoms.  His recent episode sounds like an acute flare, exacerbation.  He responded to azithromycin and prednisone.  He has an albuterol but he does not really notice any benefit when he takes it.  Most important thing right now is for him to stop smoking.  Explained to him that his COPD may become clinically relevant going forward, that we could try to quantify degree obstruction with pulmonary function testing.  We can work on this if breathing changes.  I talked to him in detail about smoking cessation techniques today  Suspect that your recent episode of coughing and breathing difficulty was a flareup of mild  COPD. Keep your albuterol inhaler available to use 2 puffs if you need it for coughing spells, shortness of breath, wheezing. I am glad that you are interested in decreasing your smoking.  Start by setting goals to decrease to a certain number of cigarettes daily.  As you bring the number of cigarettes down you will eventually be ready to set a  quit date.  We talked about strategies to help you. Try calling 1-800-QUIT-NOW for support when you are ready to quit.  You could also call our office and set up a follow-up visit at that time and will be happy to help you. If your breathing changes for the worse or if you are having more symptoms then we would like to see you back.  Allergic rhinitis Try starting generic Claritin (loratadine) 10 mg once daily to see if this helps with your congestion   Baltazar Apo, MD, PhD 03/21/2020, 5:10 PM [facility] [phone] or if no answer [phone]

## 2020-03-21 NOTE — Patient Instructions (Signed)
Suspect that your recent episode of coughing and breathing difficulty was a flareup of mild COPD. Keep your albuterol inhaler available to use 2 puffs if you need it for coughing spells, shortness of breath, wheezing. Try starting generic Claritin (loratadine) 10 mg once daily to see if this helps with your congestion I am glad that you are interested in decreasing your smoking.  Start by setting goals to decrease to a certain number of cigarettes daily.  As you bring the number of cigarettes down you will eventually be ready to set a quit date.  We talked about strategies to help you. Try calling 1-800-QUIT-NOW for support when you are ready to quit.  You could also call our office and set up a follow-up visit at that time and will be happy to help you. If your breathing changes for the worse or if you are having more symptoms then we would like to see you back.

## 2020-03-21 NOTE — Assessment & Plan Note (Signed)
Try starting generic Claritin (loratadine) 10 mg once daily to see if this helps with your congestion

## 2020-03-21 NOTE — Assessment & Plan Note (Addendum)
I suspect that he does have at least mild COPD although he does not have any overt daily symptoms.  His recent episode sounds like an acute flare, exacerbation.  He responded to azithromycin and prednisone.  He has an albuterol but he does not really notice any benefit when he takes it.  Most important thing right now is for him to stop smoking.  Explained to him that his COPD may become clinically relevant going forward, that we could try to quantify degree obstruction with pulmonary function testing.  We can work on this if breathing changes.  I talked to him in detail about smoking cessation techniques today  Suspect that your recent episode of coughing and breathing difficulty was a flareup of mild COPD. Keep your albuterol inhaler available to use 2 puffs if you need it for coughing spells, shortness of breath, wheezing. I am glad that you are interested in decreasing your smoking.  Start by setting goals to decrease to a certain number of cigarettes daily.  As you bring the number of cigarettes down you will eventually be ready to set a quit date.  We talked about strategies to help you. Try calling 1-800-QUIT-NOW for support when you are ready to quit.  You could also call our office and set up a follow-up visit at that time and will be happy to help you. If your breathing changes for the worse or if you are having more symptoms then we would like to see you back.

## 2020-05-17 ENCOUNTER — Ambulatory Visit (HOSPITAL_COMMUNITY)
Admission: EM | Admit: 2020-05-17 | Discharge: 2020-05-17 | Disposition: A | Discharge status: Home / Self Care | Payer: Medicare Other | Attending: Family Medicine | Admitting: Family Medicine

## 2020-05-17 ENCOUNTER — Encounter (HOSPITAL_COMMUNITY): Payer: Self-pay | Admitting: Emergency Medicine

## 2020-05-17 ENCOUNTER — Other Ambulatory Visit: Payer: Self-pay

## 2020-05-17 DIAGNOSIS — E0865 Diabetes mellitus due to underlying condition with hyperglycemia: Secondary | ICD-10-CM | POA: Diagnosis not present

## 2020-05-17 DIAGNOSIS — Z794 Long term (current) use of insulin: Secondary | ICD-10-CM | POA: Diagnosis not present

## 2020-05-17 MED ORDER — LANTUS SOLOSTAR 100 UNIT/ML ~~LOC~~ SOPN: 50.0000 [IU] | PEN_INJECTOR | Freq: Every day | SUBCUTANEOUS | 1 refills | Status: DC

## 2020-05-17 NOTE — ED Triage Notes (Signed)
Pt presents for refill on insulin. He states his last does was on Wednesday and Thursday his BS was in the 200s. He states his follow up appt is next month with his PCP. He takes 50 units of Lantus a day.

## 2020-05-17 NOTE — ED Provider Notes (Signed)
[place]   [id number] 05/17/20 Arrival Time: 1009  ASSESSMENT & PLAN:  1. Diabetes mellitus due to underlying condition with hyperglycemia, with long-term current use of insulin (HCC)    Refilled at his request: Meds ordered this encounter  Medications  . insulin glargine (LANTUS SOLOSTAR) 100 UNIT/ML Solostar Pen    Sig: Inject 50 Units into the skin daily.    Dispense:  15 mL    Refill:  1    Has scheduled f/u with his PCP next month.  Reviewed expectations re: course of current medical issues. Questions answered. Outlined signs and symptoms indicating need for more acute intervention. Patient verbalized understanding. After Visit Summary given.   SUBJECTIVE: History from: patient. Micheal Leblanc is a 69 y.o. male who presents requesting medication refill of his Lantus insulin. No current concerns.  Current medical problems include: Past Medical History:  Diagnosis Date  . Allergy   . Diabetes mellitus   . Diverticulitis   . ED (erectile dysfunction)   . Hx of colonic polyps   . Hyperlipidemia   . Hypertension   . Smoker      OBJECTIVE:  Vitals:   05/17/20 1105  BP: 129/78  Pulse: 69  Resp: 18  Temp: 98.3 F (36.8 C)  TempSrc: Oral  SpO2: 98%    General appearance: alert; no distress Eyes: conjunctiva normal Lungs: unlabored Skin: warm and dry Neurologic: normal gait Psychological: alert and cooperative; normal mood and affect   No Known Allergies  Social History   Socioeconomic History  . Marital status: Single    Spouse name: Not on file  . Number of children: Not on file  . Years of education: Not on file  . Highest education level: Not on file  Occupational History  . Not on file  Tobacco Use  . Smoking status: Current Every Day Smoker    Packs/day: 0.50  . Smokeless tobacco: Never Used  . Tobacco comment: 6- 7 cigarettes a day 03/21/20 [facility]  Substance and Sexual Activity  . Alcohol use: Yes    Alcohol/week: 4.0  standard drinks    Types: 2 Cans of beer, 2 Shots of liquor per week  . Drug use: No  . Sexual activity: Not Currently  Other Topics Concern  . Not on file  Social History Narrative  . Not on file   Social Determinants of Health   Financial Resource Strain:   . Difficulty of Paying Living Expenses: Not on file  Food Insecurity:   . Worried About Charity fundraiser in the Last Year: Not on file  . Ran Out of Food in the Last Year: Not on file  Transportation Needs:   . Lack of Transportation (Medical): Not on file  . Lack of Transportation (Non-Medical): Not on file  Physical Activity:   . Days of Exercise per Week: Not on file  . Minutes of Exercise per Session: Not on file  Stress:   . Feeling of Stress : Not on file  Social Connections:   . Frequency of Communication with Friends and Family: Not on file  . Frequency of Social Gatherings with Friends and Family: Not on file  . Attends Religious Services: Not on file  . Active Member of Clubs or Organizations: Not on file  . Attends Archivist Meetings: Not on file  . Marital Status: Not on file  Intimate Partner Violence:   . Fear of Current or Ex-Partner: Not on file  . Emotionally Abused: Not  on file  . Physically Abused: Not on file  . Sexually Abused: Not on file   Family History  Problem Relation Age of Onset  . Arthritis Mother   . Colon cancer Neg Hx   . Rectal cancer Neg Hx   . Stomach cancer Neg Hx   . Colon polyps Neg Hx   . Diabetes Neg Hx    Past Surgical History:  Procedure Laterality Date  . COLONOSCOPY    . POLYPECTOMY       Vanessa Kick, MD 05/17/20 1148

## 2020-10-29 DIAGNOSIS — H2513 Age-related nuclear cataract, bilateral: Secondary | ICD-10-CM | POA: Diagnosis not present

## 2020-10-29 DIAGNOSIS — E119 Type 2 diabetes mellitus without complications: Secondary | ICD-10-CM | POA: Diagnosis not present

## 2020-11-10 ENCOUNTER — Ambulatory Visit: Payer: Medicare Other | Attending: Critical Care Medicine

## 2020-11-10 DIAGNOSIS — Z20822 Contact with and (suspected) exposure to covid-19: Secondary | ICD-10-CM

## 2020-11-11 LAB — SARS-COV-2, NAA 2 DAY TAT

## 2020-11-11 LAB — NOVEL CORONAVIRUS, NAA: SARS-CoV-2, NAA: NOT DETECTED

## 2020-12-08 ENCOUNTER — Encounter: Payer: Self-pay | Admitting: Family Medicine

## 2020-12-08 ENCOUNTER — Ambulatory Visit (INDEPENDENT_AMBULATORY_CARE_PROVIDER_SITE_OTHER): Payer: Medicare Other | Admitting: Family Medicine

## 2020-12-08 VITALS — BP 120/82 | HR 86 | Temp 97.6°F | Ht 71.0 in | Wt 160.8 lb

## 2020-12-08 DIAGNOSIS — M1990 Unspecified osteoarthritis, unspecified site: Secondary | ICD-10-CM

## 2020-12-08 DIAGNOSIS — Z87891 Personal history of nicotine dependence: Secondary | ICD-10-CM

## 2020-12-08 DIAGNOSIS — I152 Hypertension secondary to endocrine disorders: Secondary | ICD-10-CM

## 2020-12-08 DIAGNOSIS — Z23 Encounter for immunization: Secondary | ICD-10-CM

## 2020-12-08 DIAGNOSIS — Z0000 Encounter for general adult medical examination without abnormal findings: Secondary | ICD-10-CM | POA: Diagnosis not present

## 2020-12-08 DIAGNOSIS — J301 Allergic rhinitis due to pollen: Secondary | ICD-10-CM | POA: Diagnosis not present

## 2020-12-08 DIAGNOSIS — K635 Polyp of colon: Secondary | ICD-10-CM | POA: Diagnosis not present

## 2020-12-08 DIAGNOSIS — E1165 Type 2 diabetes mellitus with hyperglycemia: Secondary | ICD-10-CM | POA: Diagnosis not present

## 2020-12-08 DIAGNOSIS — E785 Hyperlipidemia, unspecified: Secondary | ICD-10-CM

## 2020-12-08 DIAGNOSIS — E1159 Type 2 diabetes mellitus with other circulatory complications: Secondary | ICD-10-CM | POA: Diagnosis not present

## 2020-12-08 DIAGNOSIS — E118 Type 2 diabetes mellitus with unspecified complications: Secondary | ICD-10-CM | POA: Diagnosis not present

## 2020-12-08 DIAGNOSIS — Z1159 Encounter for screening for other viral diseases: Secondary | ICD-10-CM

## 2020-12-08 DIAGNOSIS — E1169 Type 2 diabetes mellitus with other specified complication: Secondary | ICD-10-CM | POA: Diagnosis not present

## 2020-12-08 DIAGNOSIS — N5201 Erectile dysfunction due to arterial insufficiency: Secondary | ICD-10-CM

## 2020-12-08 DIAGNOSIS — Z136 Encounter for screening for cardiovascular disorders: Secondary | ICD-10-CM

## 2020-12-08 DIAGNOSIS — IMO0002 Reserved for concepts with insufficient information to code with codable children: Secondary | ICD-10-CM

## 2020-12-08 LAB — POCT GLYCOSYLATED HEMOGLOBIN (HGB A1C): Hemoglobin A1C: 11.1 % — AB (ref 4.0–5.6)

## 2020-12-08 LAB — POCT UA - MICROALBUMIN
Albumin/Creatinine Ratio, Urine, POC: >137.3
Creatinine, POC: 218.5 mg/dL
Microalbumin Ur, POC: >300 mg/L

## 2020-12-08 MED ORDER — TADALAFIL 20 MG PO TABS: 20.0000 mg | ORAL_TABLET | Freq: Every day | ORAL | 0 refills | Status: DC | PRN

## 2020-12-08 MED ORDER — LANTUS SOLOSTAR 100 UNIT/ML ~~LOC~~ SOPN: 55.0000 [IU] | PEN_INJECTOR | Freq: Every day | SUBCUTANEOUS | 1 refills | Status: DC

## 2020-12-08 MED ORDER — PIOGLITAZONE HCL 30 MG PO TABS: 30.0000 mg | ORAL_TABLET | Freq: Every day | ORAL | 1 refills | Status: DC

## 2020-12-08 MED ORDER — OLMESARTAN-AMLODIPINE-HCTZ 40-5-25 MG PO TABS: 1.0000 | ORAL_TABLET | Freq: Every day | ORAL | 3 refills | Status: AC

## 2020-12-08 NOTE — Progress Notes (Signed)
Micheal Leblanc is a 70 y.o. male who presents for annual wellness visit ,CPE and follow-up on chronic medical conditions.  He has been getting episodic care by going to emergency rooms and urgent cares and has been getting his medications renewed through them.  He stopped coming here due to financial reasons.  He is taking 750 mg, 2 pills twice per day of the metformin.  He is also taking 50 units of Lantus insulin.  He has had no follow-up on that on a regular basis.  He continues on atorvastatin and Tribenzor.  He does have a history of colonic polyps and is scheduled for repeat colonoscopy in 2024.  He presently has 2 jobs and says that he is seems to be doing fairly well.  Plan to get rid of one of them mainly because of increased cost of gas.  He gives no indications of any difficulty psychologically.  He did recently quit smoking.  He does complain of general arthritic type pain but none of any major concern. His allergies are under good control.  He does admit to having some erectile dysfunction and would like some help with that.  Immunizations and Health Maintenance Immunization History  Administered Date(s) Administered   DTaP 10/20/1994, 10/15/2005   Hepatitis B 09/06/2011, 02/07/2012   Hepatitis B, adult 04/17/2014   PFIZER(Purple Top)SARS-COV-2 Vaccination 11/14/2019, 12/07/2019   Pneumococcal Conjugate-13 04/17/2014   Pneumococcal Polysaccharide-23 10/22/2015   Tdap 10/22/2015   Zoster, Live 08/26/2011   Health Maintenance Due  Topic Date Due   Hepatitis C Screening  Never done   Zoster Vaccines- Shingrix (1 of 2) Never done   OPHTHALMOLOGY EXAM  02/11/2017   COVID-19 Vaccine (3 - Booster for Pfizer series) 05/08/2020   HEMOGLOBIN A1C  05/15/2020    Last colonoscopy:10/25/17 Last PSA: 08/29/14 Dentist: Q six months Ophtho: Q year Exercise: N/A  Other doctors caring for patient include:Dr. Witaker eye, Dr. Ardis Hughs [facility]  Advanced Directives: Does Patient Have a Medical Advance  Directive?: No Would patient like information on creating a medical advance directive?: Yes (MAU/Ambulatory/Procedural Areas - Information given) Info given Depression screen:  See questionnaire below.     Depression screen [facility] 2/9 12/08/2020 04/20/2016 04/17/2014  Decreased Interest 1 0 0  Down, Depressed, Hopeless 0 0 0  PHQ - 2 Score 1 0 0    Fall Screen: See Questionaire below.   Fall Risk  12/08/2020 04/20/2016  Falls in the past year? 0 No  Number falls in past yr: 0 -  Injury with Fall? 0 -  Risk for fall due to : No Fall Risks -  Follow up Falls evaluation completed -    ADL screen:  See questionnaire below.  Functional Status Survey: Is the patient deaf or have difficulty hearing?: No Does the patient have difficulty seeing, even when wearing glasses/contacts?: No Does the patient have difficulty concentrating, remembering, or making decisions?: No Does the patient have difficulty walking or climbing stairs?: No Does the patient have difficulty dressing or bathing?: No Does the patient have difficulty doing errands alone such as visiting a doctor's office or shopping?: No   Review of Systems  Constitutional: -, -unexpected weight change, -anorexia, -fatigue Allergy: -sneezing, -itching, -congestion Dermatology: denies changing moles, rash, lumps ENT: -runny nose, -ear pain, -sore throat,  Cardiology:  -chest pain, -palpitations, -orthopnea, Respiratory: -cough, -shortness of breath, -dyspnea on exertion, -wheezing,  Gastroenterology: -abdominal pain, -nausea, -vomiting, -diarrhea, -constipation, -dysphagia Hematology: -bleeding or bruising problems Musculoskeletal: -arthralgias, -myalgias, -joint swelling, -  back pain, - Ophthalmology: -vision changes,  Urology: -dysuria, -difficulty urinating,  -urinary frequency, -urgency, incontinence Neurology: -, -numbness, , -memory loss, -falls, -dizziness    PHYSICAL EXAM:  General Appearance: Alert, cooperative, no  distress, appears stated age Head: Normocephalic, without obvious abnormality, atraumatic Eyes: PERRL, conjunctiva/corneas clear, EOM's intact, Ears: Normal TM's and external ear canals Nose: Nares normal, mucosa normal, no drainage or sinus   tenderness Throat: Lips, mucosa, and tongue normal; teeth and gums normal Neck: Supple, no lymphadenopathy, thyroid:no enlargement/tenderness/nodules; no carotid bruit or JVD Lungs: Clear to auscultation bilaterally without wheezes, rales or ronchi; respirations unlabored Heart: Regular rate and rhythm, S1 and S2 normal, no murmur, rub or gallop  Extremities: No clubbing, cyanosis or edema.  Pedal pulses are strong.  Sensation is normal. Pulses: 2+ and symmetric all extremities Skin: Skin color, texture, turgor normal, no rashes or lesions Lymph nodes: Cervical, supraclavicular, and axillary nodes normal Neurologic: CNII-XII intact, normal strength, sensation and gait; reflexes 2+ and symmetric throughout   Psych: Normal mood, affect, hygiene and grooming Hemoglobin A1c is 11.1 ASSESSMENT/PLAN: Routine general medical examination at a health care facility - Plan: CBC with Differential/Platelet, Comprehensive metabolic panel, Lipid panel  Hypertension complicating diabetes ([place]) - Plan: CBC with Differential/Platelet, Comprehensive metabolic panel, Olmesartan-amLODIPine-HCTZ (TRIBENZOR) 40-5-25 MG TABS  Polyp of colon, unspecified part of colon, unspecified type  Diabetes mellitus type 2, uncontrolled, with complications ([place]) - Plan: CBC with Differential/Platelet, Comprehensive metabolic panel, POCT glycosylated hemoglobin (Hb A1C), pioglitazone (ACTOS) 30 MG tablet, insulin glargine (LANTUS SOLOSTAR) 100 UNIT/ML Solostar Pen, CANCELED: POCT UA - Microalbumin  Arthritis  Hyperlipidemia associated with type 2 diabetes mellitus ([place]) - Plan: Lipid panel  Allergic rhinitis due to pollen, unspecified seasonality  Erectile dysfunction due to  arterial insufficiency - Plan: tadalafil (CIALIS) 20 MG tablet  Former smoker - Plan: [place] Acupuncturist, viral disease - Plan: PFIZER Comirnaty(GRAY TOP)COVID-19 Vaccine  Screening for AAA (abdominal aortic aneurysm) - Plan: US AORTA  Need for hepatitis C screening test - Plan: Hepatitis C antibody Take 1 metformin twice per day.  You are taking the new medicine called Actos or pioglitazone.  Increase your Lantus to 55 units.  Check your blood sugar every morning and keep a record of it.  Set up a virtual visit in about a month so we can look at those blood sugars.  Work on cutting back on the carbohydrates.  Cut back on "white food" Follow-up appointment in 1 month for better control of your diabetes and in about 3 months for a general check.   Immunization recommendations discussed.  Colonoscopy recommendations reviewed.   Medicare Attestation I have personally reviewed: The patient's medical and social history Their use of alcohol, tobacco or illicit drugs Their current medications and supplements The patient's functional ability including ADLs,fall risks, home safety risks, cognitive, and hearing and visual impairment Diet and physical activities Evidence for depression or mood disorders  The patient's weight, height, and BMI have been recorded in the chart.  I have made referrals, counseling, and provided education to the patient based on review of the above and I have provided the patient with a written personalized care plan for preventive services.     Jill Alexanders, MD   12/08/2020

## 2020-12-08 NOTE — Patient Instructions (Signed)
  Micheal Leblanc , Thank you for taking time to come for your Medicare Wellness Visit. I appreciate your ongoing commitment to your health goals. Please review the following plan we discussed and let me know if I can assist you in the future.   These are the goals we discussed: Take 1 metformin twice per day.  Are taking the new medicine called Actos or pioglitazone.  Increase your Lantus to 55 units.  Check your blood sugar every morning and keep a record of it.  Set up a virtual visit in about a month so we can look at those blood sugars.  Work on cutting back on the carbohydrates.  Cut back on "white food"   This is a list of the screening recommended for you and due dates:  Health Maintenance  Topic Date Due   Hepatitis C Screening: USPSTF Recommendation to screen - Ages 70-79 yo.  Never done   Zoster (Shingles) Vaccine (1 of 2) Never done   Eye exam for diabetics  02/11/2017   COVID-19 Vaccine (3 - Booster for Pfizer series) 05/08/2020   Hemoglobin A1C  05/15/2020   Flu Shot  01/26/2021   Complete foot exam   12/08/2021   Colon Cancer Screening  10/26/2022   Tetanus Vaccine  10/21/2025   HPV Vaccine  Aged Out   Pneumonia vaccines  Discontinued

## 2020-12-09 LAB — COMPREHENSIVE METABOLIC PANEL
ALT: 18 IU/L (ref 0–44)
AST: 15 IU/L (ref 0–40)
Albumin/Globulin Ratio: 1.6 (ref 1.2–2.2)
Albumin: 4.4 g/dL (ref 3.8–4.8)
Alkaline Phosphatase: 90 IU/L (ref 44–121)
BUN/Creatinine Ratio: 17 (ref 10–24)
BUN: 19 mg/dL (ref 8–27)
Bilirubin Total: 1 mg/dL (ref 0.0–1.2)
CO2: 21 mmol/L (ref 20–29)
Calcium: 10.3 mg/dL — ABNORMAL HIGH (ref 8.6–10.2)
Chloride: 98 mmol/L (ref 96–106)
Creatinine, Ser: 1.13 mg/dL (ref 0.76–1.27)
Globulin, Total: 2.8 g/dL (ref 1.5–4.5)
Glucose: 101 mg/dL — ABNORMAL HIGH (ref 65–99)
Potassium: 3.8 mmol/L (ref 3.5–5.2)
Sodium: 138 mmol/L (ref 134–144)
Total Protein: 7.2 g/dL (ref 6.0–8.5)
eGFR: 70 mL/min/{1.73_m2} (ref >59)

## 2020-12-09 LAB — LIPID PANEL
Chol/HDL Ratio: 2.9 ratio (ref 0.0–5.0)
Cholesterol, Total: 130 mg/dL (ref 100–199)
HDL: 45 mg/dL (ref >39)
LDL Chol Calc (NIH): 64 mg/dL (ref 0–99)
Triglycerides: 118 mg/dL (ref 0–149)
VLDL Cholesterol Cal: 21 mg/dL (ref 5–40)

## 2020-12-09 LAB — CBC WITH DIFFERENTIAL/PLATELET
Basophils Absolute: 0 10*3/uL (ref 0.0–0.2)
Basos: 1 %
EOS (ABSOLUTE): 0.1 10*3/uL (ref 0.0–0.4)
Eos: 1 %
Hematocrit: 53.4 % — ABNORMAL HIGH (ref 37.5–51.0)
Hemoglobin: 17.5 g/dL (ref 13.0–17.7)
Immature Grans (Abs): 0 10*3/uL (ref 0.0–0.1)
Immature Granulocytes: 0 %
Lymphocytes Absolute: 2.3 10*3/uL (ref 0.7–3.1)
Lymphs: 35 %
MCH: 28.2 pg (ref 26.6–33.0)
MCHC: 32.8 g/dL (ref 31.5–35.7)
MCV: 86 fL (ref 79–97)
Monocytes Absolute: 0.6 10*3/uL (ref 0.1–0.9)
Monocytes: 9 %
Neutrophils Absolute: 3.5 10*3/uL (ref 1.4–7.0)
Neutrophils: 54 %
Platelets: 131 10*3/uL — ABNORMAL LOW (ref 150–450)
RBC: 6.21 x10E6/uL — ABNORMAL HIGH (ref 4.14–5.80)
RDW: 13.8 % (ref 11.6–15.4)
WBC: 6.5 10*3/uL (ref 3.4–10.8)

## 2020-12-09 LAB — HEPATITIS C ANTIBODY: Hep C Virus Ab: <0.1 s/co ratio (ref 0.0–0.9)

## 2020-12-09 NOTE — Progress Notes (Signed)
Lvm for advising pt of labs [facility]

## 2020-12-12 ENCOUNTER — Other Ambulatory Visit: Payer: Self-pay

## 2020-12-12 DIAGNOSIS — E1169 Type 2 diabetes mellitus with other specified complication: Secondary | ICD-10-CM

## 2020-12-12 DIAGNOSIS — IMO0002 Reserved for concepts with insufficient information to code with codable children: Secondary | ICD-10-CM

## 2020-12-12 MED ORDER — METFORMIN HCL ER 750 MG PO TB24: 750.0000 mg | ORAL_TABLET | Freq: Two times a day (BID) | ORAL | 0 refills | Status: AC

## 2020-12-12 MED ORDER — ATORVASTATIN CALCIUM 20 MG PO TABS
20.0000 mg | ORAL_TABLET | Freq: Every day | ORAL | 0 refills | Status: DC
Start: 2020-12-12 — End: 2021-04-03

## 2020-12-16 ENCOUNTER — Ambulatory Visit
Admission: RE | Admit: 2020-12-16 | Discharge: 2020-12-16 | Disposition: A | Discharge status: Home / Self Care | Payer: Medicare Other | Source: Ambulatory Visit | Attending: Family Medicine | Admitting: Family Medicine

## 2020-12-16 DIAGNOSIS — Z136 Encounter for screening for cardiovascular disorders: Secondary | ICD-10-CM

## 2020-12-16 DIAGNOSIS — Z87891 Personal history of nicotine dependence: Secondary | ICD-10-CM | POA: Diagnosis not present

## 2020-12-16 NOTE — Progress Notes (Signed)
The blood work is normal 

## 2021-01-12 ENCOUNTER — Telehealth (INDEPENDENT_AMBULATORY_CARE_PROVIDER_SITE_OTHER): Payer: Medicare Other | Admitting: Family Medicine

## 2021-01-12 ENCOUNTER — Other Ambulatory Visit: Payer: Self-pay

## 2021-01-12 VITALS — Temp 98.0°F | Wt 160.0 lb

## 2021-01-12 DIAGNOSIS — E1165 Type 2 diabetes mellitus with hyperglycemia: Secondary | ICD-10-CM | POA: Diagnosis not present

## 2021-01-12 DIAGNOSIS — E118 Type 2 diabetes mellitus with unspecified complications: Secondary | ICD-10-CM

## 2021-01-12 DIAGNOSIS — IMO0002 Reserved for concepts with insufficient information to code with codable children: Secondary | ICD-10-CM

## 2021-01-12 MED ORDER — GLUCOSE BLOOD VI STRP: ORAL_STRIP | 0 refills | Status: DC

## 2021-01-12 NOTE — Progress Notes (Signed)
   Subjective:    Patient ID: Micheal Leblanc, male    DOB: 1951/06/26, 70 y.o.   MRN: [id number]  HPI Documentation for virtual audio  telecommunications through [place] encounter: Could not get video to work. The patient was located at home. 2 patient identifiers used.  The provider was located in the office. The patient did consent to this visit and is aware of possible charges through their insurance for this visit. The other persons participating in this telemedicine service were none. Time spent on call was 5 minutes and in review of previous records >20 minutes total for counseling and coordination of care. This virtual service is not related to other E/M service within previous 7 days.  He was unable to get his A/V to work properly and we had to do it by phone.  He states that he is checking his blood sugars usually 2 hours after he eats and they usually average around 180.  He did have 1 that was 220.  He has made some changes in his diet and exercise.  Review of Systems     Objective:   Physical Exam Alert and in no distress otherwise not examined I did go over his metformin and pioglitazone regimen with him.  He will also continue on his 55 units of insulin daily.      Assessment & Plan:  Diabetes mellitus type 2, uncontrolled, with complications ([place]) He will continue on his medication regimen.  He was scheduled for a recheck in about 2 months.

## 2021-02-06 ENCOUNTER — Other Ambulatory Visit: Payer: Self-pay | Admitting: Family Medicine

## 2021-02-06 DIAGNOSIS — E1165 Type 2 diabetes mellitus with hyperglycemia: Secondary | ICD-10-CM

## 2021-02-06 DIAGNOSIS — IMO0002 Reserved for concepts with insufficient information to code with codable children: Secondary | ICD-10-CM

## 2021-03-03 ENCOUNTER — Other Ambulatory Visit: Payer: Self-pay | Admitting: Family Medicine

## 2021-03-13 ENCOUNTER — Other Ambulatory Visit: Payer: Self-pay | Admitting: Family Medicine

## 2021-03-13 DIAGNOSIS — IMO0002 Reserved for concepts with insufficient information to code with codable children: Secondary | ICD-10-CM

## 2021-03-13 DIAGNOSIS — E1165 Type 2 diabetes mellitus with hyperglycemia: Secondary | ICD-10-CM

## 2021-03-16 ENCOUNTER — Ambulatory Visit: Payer: Self-pay | Admitting: Family Medicine

## 2021-03-30 ENCOUNTER — Other Ambulatory Visit: Payer: Self-pay

## 2021-03-30 ENCOUNTER — Ambulatory Visit (INDEPENDENT_AMBULATORY_CARE_PROVIDER_SITE_OTHER): Payer: Medicare Other | Admitting: Family Medicine

## 2021-03-30 ENCOUNTER — Ambulatory Visit: Payer: Medicare Other | Admitting: Family Medicine

## 2021-03-30 VITALS — BP 126/74 | HR 67 | Temp 98.2°F | Wt 171.6 lb

## 2021-03-30 DIAGNOSIS — Z23 Encounter for immunization: Secondary | ICD-10-CM | POA: Diagnosis not present

## 2021-03-30 DIAGNOSIS — E1169 Type 2 diabetes mellitus with other specified complication: Secondary | ICD-10-CM | POA: Diagnosis not present

## 2021-03-30 DIAGNOSIS — I152 Hypertension secondary to endocrine disorders: Secondary | ICD-10-CM | POA: Diagnosis not present

## 2021-03-30 DIAGNOSIS — E1165 Type 2 diabetes mellitus with hyperglycemia: Secondary | ICD-10-CM | POA: Diagnosis not present

## 2021-03-30 DIAGNOSIS — E785 Hyperlipidemia, unspecified: Secondary | ICD-10-CM | POA: Diagnosis not present

## 2021-03-30 DIAGNOSIS — E1159 Type 2 diabetes mellitus with other circulatory complications: Secondary | ICD-10-CM | POA: Diagnosis not present

## 2021-03-30 LAB — POCT GLYCOSYLATED HEMOGLOBIN (HGB A1C): Hemoglobin A1C: 9.1 % — AB (ref 4.0–5.6)

## 2021-03-30 MED ORDER — GLUCOSE BLOOD VI STRP: ORAL_STRIP | 12 refills | Status: DC

## 2021-03-30 MED ORDER — ACCU-CHEK SOFTCLIX LANCETS MISC: 12 refills | Status: AC

## 2021-03-30 NOTE — Patient Instructions (Signed)
Lets get you a new glucometer.  I want you to check your blood sugars in the morning and then 2 hours after lunch or dinner and also if there can tell.  When you check your morning blood sugar increase your insulin by 2 units every 2 days until your morning blood sugar is below 120

## 2021-03-30 NOTE — Progress Notes (Signed)
  Subjective:    Patient ID: Micheal Leblanc, male    DOB: 09-30-1950, 70 y.o.   MRN: [id number]  Micheal Leblanc is a 70 y.o. male who presents for follow-up of Type 2 diabetes mellitus.  Home blood sugar records:  fasting and post meal highest reading 220 lowest reading 95 he usually checks postprandial. Current symptoms/problems include none and have been stable. Daily foot checks:yes   Any foot concerns: left great toe pin sticking  Exercise: The patient does not participate in regular exercise at present. Diet: Regular. Presently he is taking Lantus 55 units as well as metformin 750 twice daily and pioglitazone.  Continues on atorvastatin as well as olmesartan/amlodipine. The following portions of the patient's history were reviewed and updated as appropriate: allergies, current medications, past medical history, past social history and problem list.  ROS as in subjective above.     Objective:    Physical Exam Alert and in no distress otherwise not examined.  Hemoglobin A1c is 9.1  Lab Review Diabetic Labs Latest Ref Rng & Units 12/08/2020 11/13/2019 12/12/2018 01/09/2018 04/20/2016  HbA1c 4.0 - 5.6 % 11.1(A) 8.8(H) 9.6(H) - 8.2  Microalbumin mg/L >300.0 - - - -  Micro/Creat Ratio - >137.3 - - - -  Chol 100 - 199 mg/dL 130 - - - -  HDL >39 mg/dL 45 - - - -  Calc LDL 0 - 99 mg/dL 64 - - - -  Triglycerides 0 - 149 mg/dL 118 - - - -  Creatinine 0.76 - 1.27 mg/dL 1.13 1.03 - 0.80 -   BP/Weight 03/30/2021 01/12/2021 12/08/2020 05/17/2020 5/46/2703  Systolic BP 500 - 938 182 993  Diastolic BP 74 - 82 78 72  Wt. (Lbs) 171.6 160 160.8 - 167.8  BMI 23.93 22.32 22.43 - 23.4   Foot/eye exam completion dates Latest Ref Rng & Units 12/08/2020 02/12/2016  Eye Exam No Retinopathy - No Retinopathy  Foot Form Completion - Done -    Micheal Leblanc  reports that he has been smoking cigarettes. He has been smoking an average of .5 packs per day. He has never used smokeless tobacco. He reports current  alcohol use of about 4.0 standard drinks per week. He reports that he does not use drugs.     Assessment & Plan:  Type 2 diabetes mellitus with hyperglycemia, without long-term current use of insulin (HCC)  Need for influenza vaccination - Plan: CANCELED: Flu Vaccine QUAD High Dose(Fluad)  Hyperlipidemia associated with type 2 diabetes mellitus ([place]) - Plan: POCT glycosylated hemoglobin (Hb A1C)  Hypertension complicating diabetes ([place]) Lets get you a new glucometer.  I want you to check your blood sugars in the morning and then 2 hours after lunch or dinner and also if there can tell.  When you check your morning blood sugar increase your insulin by 2 units every 2 days until your morning blood sugar is below 120 Rx changes: none Education: Reviewed 'ABCs' of diabetes management (respective goals in parentheses):  A1C (<7), blood pressure (<130/80), and cholesterol (LDL <100). Compliance at present is estimated to be poor. Efforts to improve compliance (if necessary) will be directed at  as above . Follow up: 4 months  A new glucometer was given to the patient as I explained that his numbers are not accurate at all.

## 2021-04-03 ENCOUNTER — Other Ambulatory Visit: Payer: Self-pay | Admitting: Family Medicine

## 2021-04-03 DIAGNOSIS — E1169 Type 2 diabetes mellitus with other specified complication: Secondary | ICD-10-CM

## 2021-04-03 DIAGNOSIS — E785 Hyperlipidemia, unspecified: Secondary | ICD-10-CM

## 2021-06-02 ENCOUNTER — Other Ambulatory Visit: Payer: Self-pay | Admitting: Family Medicine

## 2021-06-02 DIAGNOSIS — E118 Type 2 diabetes mellitus with unspecified complications: Secondary | ICD-10-CM

## 2021-07-09 ENCOUNTER — Telehealth: Payer: Self-pay

## 2021-07-09 ENCOUNTER — Observation Stay (HOSPITAL_COMMUNITY)
Admission: EM | Admit: 2021-07-09 | Discharge: 2021-07-11 | Disposition: A | Discharge status: Home / Self Care | Payer: Medicare Other | Attending: Internal Medicine | Admitting: Internal Medicine

## 2021-07-09 ENCOUNTER — Encounter (HOSPITAL_COMMUNITY): Payer: Self-pay

## 2021-07-09 DIAGNOSIS — E785 Hyperlipidemia, unspecified: Secondary | ICD-10-CM | POA: Diagnosis not present

## 2021-07-09 DIAGNOSIS — D696 Thrombocytopenia, unspecified: Secondary | ICD-10-CM

## 2021-07-09 DIAGNOSIS — Z794 Long term (current) use of insulin: Secondary | ICD-10-CM | POA: Insufficient documentation

## 2021-07-09 DIAGNOSIS — I10 Essential (primary) hypertension: Secondary | ICD-10-CM | POA: Diagnosis not present

## 2021-07-09 DIAGNOSIS — E109 Type 1 diabetes mellitus without complications: Secondary | ICD-10-CM | POA: Diagnosis not present

## 2021-07-09 DIAGNOSIS — Z79899 Other long term (current) drug therapy: Secondary | ICD-10-CM | POA: Insufficient documentation

## 2021-07-09 DIAGNOSIS — F17210 Nicotine dependence, cigarettes, uncomplicated: Secondary | ICD-10-CM | POA: Insufficient documentation

## 2021-07-09 DIAGNOSIS — Z20822 Contact with and (suspected) exposure to covid-19: Secondary | ICD-10-CM | POA: Insufficient documentation

## 2021-07-09 DIAGNOSIS — K922 Gastrointestinal hemorrhage, unspecified: Principal | ICD-10-CM | POA: Diagnosis present

## 2021-07-09 DIAGNOSIS — D62 Acute posthemorrhagic anemia: Secondary | ICD-10-CM

## 2021-07-09 DIAGNOSIS — K921 Melena: Secondary | ICD-10-CM | POA: Diagnosis not present

## 2021-07-09 DIAGNOSIS — R739 Hyperglycemia, unspecified: Secondary | ICD-10-CM

## 2021-07-09 DIAGNOSIS — E1165 Type 2 diabetes mellitus with hyperglycemia: Secondary | ICD-10-CM | POA: Diagnosis not present

## 2021-07-09 DIAGNOSIS — K625 Hemorrhage of anus and rectum: Secondary | ICD-10-CM

## 2021-07-09 DIAGNOSIS — Z7984 Long term (current) use of oral hypoglycemic drugs: Secondary | ICD-10-CM | POA: Insufficient documentation

## 2021-07-09 DIAGNOSIS — E1169 Type 2 diabetes mellitus with other specified complication: Secondary | ICD-10-CM | POA: Diagnosis not present

## 2021-07-09 DIAGNOSIS — E119 Type 2 diabetes mellitus without complications: Secondary | ICD-10-CM

## 2021-07-09 LAB — TYPE AND SCREEN
ABO/RH(D): O POS
Antibody Screen: NEGATIVE

## 2021-07-09 LAB — COMPREHENSIVE METABOLIC PANEL
ALT: 24 U/L (ref 0–44)
AST: 19 U/L (ref 15–41)
Albumin: 3.4 g/dL — ABNORMAL LOW (ref 3.5–5.0)
Alkaline Phosphatase: 74 U/L (ref 38–126)
Anion gap: 7 (ref 5–15)
BUN: 26 mg/dL — ABNORMAL HIGH (ref 8–23)
CO2: 24 mmol/L (ref 22–32)
Calcium: 8.5 mg/dL — ABNORMAL LOW (ref 8.9–10.3)
Chloride: 102 mmol/L (ref 98–111)
Creatinine, Ser: 1.31 mg/dL — ABNORMAL HIGH (ref 0.61–1.24)
GFR, Estimated: 59 mL/min — ABNORMAL LOW (ref >60)
Glucose, Bld: 526 mg/dL (ref 70–99)
Potassium: 4.3 mmol/L (ref 3.5–5.1)
Sodium: 133 mmol/L — ABNORMAL LOW (ref 135–145)
Total Bilirubin: 0.8 mg/dL (ref 0.3–1.2)
Total Protein: 6 g/dL — ABNORMAL LOW (ref 6.5–8.1)

## 2021-07-09 LAB — CBC WITH DIFFERENTIAL/PLATELET
Abs Immature Granulocytes: 0.02 10*3/uL (ref 0.00–0.07)
Basophils Absolute: 0 10*3/uL (ref 0.0–0.1)
Basophils Relative: 1 %
Eosinophils Absolute: 0.1 10*3/uL (ref 0.0–0.5)
Eosinophils Relative: 2 %
HCT: 35 % — ABNORMAL LOW (ref 39.0–52.0)
Hemoglobin: 11.4 g/dL — ABNORMAL LOW (ref 13.0–17.0)
Immature Granulocytes: 0 %
Lymphocytes Relative: 30 %
Lymphs Abs: 2.1 10*3/uL (ref 0.7–4.0)
MCH: 29.1 pg (ref 26.0–34.0)
MCHC: 32.6 g/dL (ref 30.0–36.0)
MCV: 89.3 fL (ref 80.0–100.0)
Monocytes Absolute: 0.6 10*3/uL (ref 0.1–1.0)
Monocytes Relative: 8 %
Neutro Abs: 4.1 10*3/uL (ref 1.7–7.7)
Neutrophils Relative %: 59 %
Platelets: 110 10*3/uL — ABNORMAL LOW (ref 150–400)
RBC: 3.92 MIL/uL — ABNORMAL LOW (ref 4.22–5.81)
RDW: 13.7 % (ref 11.5–15.5)
WBC: 7 10*3/uL (ref 4.0–10.5)
nRBC: 0 % (ref 0.0–0.2)

## 2021-07-09 LAB — HEMOGLOBIN AND HEMATOCRIT, BLOOD
HCT: 31.3 % — ABNORMAL LOW (ref 39.0–52.0)
Hemoglobin: 10.3 g/dL — ABNORMAL LOW (ref 13.0–17.0)

## 2021-07-09 LAB — CBG MONITORING, ED: Glucose-Capillary: 253 mg/dL — ABNORMAL HIGH (ref 70–99)

## 2021-07-09 LAB — RESP PANEL BY RT-PCR (FLU A&B, COVID) ARPGX2
Influenza A by PCR: NEGATIVE
Influenza B by PCR: NEGATIVE
SARS Coronavirus 2 by RT PCR: NEGATIVE

## 2021-07-09 LAB — PROTIME-INR
INR: 1.1 (ref 0.8–1.2)
Prothrombin Time: 13.7 seconds (ref 11.4–15.2)

## 2021-07-09 MED ORDER — ONDANSETRON HCL 4 MG PO TABS: 4.0000 mg | ORAL_TABLET | Freq: Four times a day (QID) | ORAL | Status: DC | PRN

## 2021-07-09 MED ORDER — INSULIN ASPART 100 UNIT/ML IJ SOLN
0.0000 [IU] | Freq: Three times a day (TID) | INTRAMUSCULAR | Status: DC
  Administered 2021-07-10: 3 [IU] via SUBCUTANEOUS
  Administered 2021-07-10: 5 [IU] via SUBCUTANEOUS
  Filled 2021-07-09: qty 0.15

## 2021-07-09 MED ORDER — PANTOPRAZOLE SODIUM 40 MG IV SOLR
40.0000 mg | Freq: Two times a day (BID) | INTRAVENOUS | Status: DC
  Administered 2021-07-09 – 2021-07-10 (×3): 40 mg via INTRAVENOUS
  Filled 2021-07-09 (×3): qty 40

## 2021-07-09 MED ORDER — ACETAMINOPHEN 650 MG RE SUPP: 650.0000 mg | Freq: Four times a day (QID) | RECTAL | Status: DC | PRN

## 2021-07-09 MED ORDER — HYDRALAZINE HCL 20 MG/ML IJ SOLN: 10.0000 mg | Freq: Four times a day (QID) | INTRAMUSCULAR | Status: DC | PRN

## 2021-07-09 MED ORDER — ONDANSETRON HCL 4 MG/2ML IJ SOLN: 4.0000 mg | Freq: Four times a day (QID) | INTRAMUSCULAR | Status: DC | PRN

## 2021-07-09 MED ORDER — ACETAMINOPHEN 325 MG PO TABS: 650.0000 mg | ORAL_TABLET | Freq: Four times a day (QID) | ORAL | Status: DC | PRN

## 2021-07-09 MED ORDER — MELATONIN 5 MG PO TABS
5.0000 mg | ORAL_TABLET | Freq: Once | ORAL | Status: AC
  Administered 2021-07-09: 5 mg via ORAL
  Filled 2021-07-09: qty 1

## 2021-07-09 MED ORDER — INSULIN GLARGINE-YFGN 100 UNIT/ML ~~LOC~~ SOLN
20.0000 [IU] | Freq: Every day | SUBCUTANEOUS | Status: DC
  Administered 2021-07-09 – 2021-07-10 (×2): 20 [IU] via SUBCUTANEOUS
  Filled 2021-07-09 (×3): qty 0.2

## 2021-07-09 MED ORDER — SODIUM CHLORIDE 0.9 % IV SOLN: INTRAVENOUS | Status: DC

## 2021-07-09 MED ORDER — INSULIN ASPART 100 UNIT/ML IJ SOLN
0.0000 [IU] | Freq: Every day | INTRAMUSCULAR | Status: DC
  Administered 2021-07-09: 3 [IU] via SUBCUTANEOUS
  Administered 2021-07-10: 2 [IU] via SUBCUTANEOUS
  Filled 2021-07-09: qty 0.05

## 2021-07-09 MED ORDER — ALBUTEROL SULFATE (2.5 MG/3ML) 0.083% IN NEBU: 3.0000 mL | INHALATION_SOLUTION | Freq: Four times a day (QID) | RESPIRATORY_TRACT | Status: DC | PRN

## 2021-07-09 NOTE — ED Triage Notes (Signed)
Pt arrived via POV, c/o rectal bleeding, dark red x4 episodes since last night.

## 2021-07-09 NOTE — Telephone Encounter (Signed)
Pt. Called stating he has had blood in his stool since yesterday. He went 3 times yesterday all of them had blood and twice this morning and both of those had blood in them. I got him scheduled tomorrow with Audelia Acton at 32. He wanted to know if it would be ok for him to wait until tomorrow he is getting kind of worried now because the amount of blood in his stool.

## 2021-07-09 NOTE — H&P (Addendum)
[facility] History and Physical  Micheal Leblanc:[id number] DOB: 1950-10-31 DOA: 07/09/2021  Referring physician: ED  PCP: Denita Lung, MD   Patient is coming from: Home  Chief Complaint: Rectal bleed  HPI: Micheal Leblanc is a 71 y.o. male with past medical history of allergy, diabetes mellitus,  hyperlipidemia, hypertension, presented to hospital with rectal bleeding.  Patient reported  that he probably had 3-4 episodes of bright red bleeding per rectum since yesterday evening.  Bleeding was painless mild to moderate in amount.  Last episode this afternoon.  Patient denies any history of rectal bleed in the past including hemorrhoids.   Patient denies any nausea vomiting fever chills abdominal pain .  Patient denies any dizziness, lightheadedness or syncope.  Patient also denies any chest pain palpitation or shortness of breath.  He did have a colonoscopy almost 3 years back with note of polyps with [facility].  Patient does smoke every day and has mild cough.  ED Course: In the ED, patient was noted to have gross blood on digital exam.  Hemoglobin was 11.4, platelet of 110.  Patient had hemoglobin of around 17.5 months back.  Sodium of 133 with a blood glucose level elevated at 526.  BUN 26 with creatinine of 1.3.  Was then admitted hospital for acute GI bleed.  Dr. Lyndel Safe was notified from the ED.  Review of Systems:  All systems were reviewed and were negative unless otherwise mentioned in the HPI  Past Medical History:  Diagnosis Date   Allergy    Diabetes mellitus    Diverticulitis    ED (erectile dysfunction)    Hx of colonic polyps    Hyperlipidemia    Hypertension    Smoker    Past Surgical History:  Procedure Laterality Date   COLONOSCOPY     POLYPECTOMY      Social History:  reports that he has been smoking cigarettes. He has been smoking an average of .5 packs per day. He has never used smokeless tobacco. He reports current alcohol use of about 4.0  standard drinks per week. He reports that he does not use drugs.  No Known Allergies  Family History  Problem Relation Age of Onset   Arthritis Mother    Colon cancer Neg Hx    Rectal cancer Neg Hx    Stomach cancer Neg Hx    Colon polyps Neg Hx    Diabetes Neg Hx      Prior to Admission medications   Medication Sig Start Date End Date Taking? Authorizing Provider  Accu-Chek Softclix Lancets lancets Use as instructed 03/30/21   Denita Lung, MD  albuterol (VENTOLIN HFA) 108 (90 Base) MCG/ACT inhaler Inhale 1-2 puffs into the lungs every 6 (six) hours as needed for wheezing or shortness of breath. 01/22/20   Darr, Edison Nasuti, PA-C  atorvastatin (LIPITOR) 20 MG tablet TAKE 1 TABLET BY MOUTH EVERY DAY 04/03/21   Denita Lung, MD  erythromycin ophthalmic ointment Place 1 application into the right eye daily. Patient not taking: No sig reported 08/06/18   Chase Picket, MD  glucose blood test strip Use as instructed 03/30/21   Denita Lung, MD  Insulin Pen Needle (PEN NEEDLES) 31G X 8 MM MISC Use this for lantus solorstar pen 12/12/18   Wieters, [employer] C, PA-C  LANTUS SOLOSTAR 100 UNIT/ML Solostar Pen INJECT 55 UNITS INTO THE SKIN DAILY. 03/13/21 04/20/21  Denita Lung, MD  metFORMIN (GLUCOPHAGE-XR) 750 MG 24 hr  tablet Take 1 tablet (750 mg total) by mouth 2 (two) times daily. 12/12/20   Denita Lung, MD  naproxen (NAPROSYN) 375 MG tablet Take 1 tablet (375 mg total) by mouth 2 (two) times daily. Patient not taking: No sig reported 12/12/18   Wieters, [employer], PA-C  Olmesartan-amLODIPine-HCTZ (TRIBENZOR) 40-5-25 MG TABS Take 1 tablet by mouth daily. 12/08/20   Denita Lung, MD  pioglitazone (ACTOS) 30 MG tablet TAKE 1 TABLET BY MOUTH EVERY DAY 06/02/21   Denita Lung, MD  tadalafil (CIALIS) 20 MG tablet Take 1 tablet (20 mg total) by mouth daily as needed for erectile dysfunction. Patient not taking: Reported on 03/30/2021 12/08/20   Denita Lung, MD    Physical Exam: Vitals:    07/09/21 1001 07/09/21 1617  BP: 128/73 (!) 145/92  Pulse: 89 81  Resp: 18 18  Temp: 97.9 F (36.6 C)   TempSrc: Oral   SpO2: 98% 100%   Wt Readings from Last 3 Encounters:  03/30/21 77.8 kg  01/12/21 72.6 kg  12/08/20 72.9 kg   There is no height or weight on file to calculate BMI.  General:  Average built, not in obvious distress HENT: Normocephalic, pupils equally reacting to light and accommodation.  No scleral pallor or icterus noted. Oral mucosa is moist.  Chest:  Clear breath sounds.  Diminished breath sounds bilaterally. No crackles or wheezes.  CVS: S1 &S2 heard. No murmur.  Regular rate and rhythm. Abdomen: Soft, nontender, nondistended.  Bowel sounds are heard.  Liver is not palpable, no abdominal mass palpated Extremities: No cyanosis, clubbing or edema.  Peripheral pulses are palpable. Psych: Alert, awake and oriented, normal mood CNS:  No cranial nerve deficits.  Power equal in all extremities.   No cerebellar signs.   Skin: Warm and dry.  No rashes noted.  Labs on Admission:   CBC: Recent Labs  Lab 07/09/21 1047  WBC 7.0  NEUTROABS 4.1  HGB 11.4*  HCT 35.0*  MCV 89.3  PLT 110*    Basic Metabolic Panel: Recent Labs  Lab 07/09/21 1047  NA 133*  K 4.3  CL 102  CO2 24  GLUCOSE 526*  BUN 26*  CREATININE 1.31*  CALCIUM 8.5*    Liver Function Tests: Recent Labs  Lab 07/09/21 1047  AST 19  ALT 24  ALKPHOS 74  BILITOT 0.8  PROT 6.0*  ALBUMIN 3.4*   No results for input(s): LIPASE, AMYLASE in the last 168 hours. No results for input(s): AMMONIA in the last 168 hours.  Cardiac Enzymes: No results for input(s): CKTOTAL, CKMB, CKMBINDEX, TROPONINI in the last 168 hours.  BNP (last 3 results) No results for input(s): BNP in the last 8760 hours.  ProBNP (last 3 results) No results for input(s): PROBNP in the last 8760 hours.  CBG: No results for input(s): GLUCAP in the last 168 hours.  Lipase  No results found for: LIPASE    Urinalysis    Component Value Date/Time   LABSPEC >=1.030 03/22/2019 1341   PHURINE 5.5 03/22/2019 1341   GLUCOSEU NEGATIVE 03/22/2019 1341   HGBUR NEGATIVE 03/22/2019 1341   BILIRUBINUR SMALL (A) 03/22/2019 1341   BILIRUBINUR neg 03/30/2011 1711   KETONESUR TRACE (A) 03/22/2019 1341   PROTEINUR >=300 (A) 03/22/2019 1341   UROBILINOGEN 1.0 03/22/2019 1341   NITRITE NEGATIVE 03/22/2019 1341   LEUKOCYTESUR NEGATIVE 03/22/2019 1341     Drugs of Abuse  No results found for: LABOPIA, COCAINSCRNUR, LABBENZ, AMPHETMU, THCU, LABBARB  Radiological Exams on Admission: No results found.  EKG: Not available for review.  Assessment/Plan Principal Problem:   GI bleed  GI bleed/hematochezia.  Likely diverticular bleed.  Hemoglobin 7 months back was 17.5, hemoglobin today at 11.4.  Patient is hemodynamically stable at this time.  Likely lower GI bleed.  History of diverticulitis.  No history of GI bleed in the past.    Patient might need endoscopic evaluation.  We will put the patient on Protonix, IV fluids, Intake and output charting.  Transfuse for hemoglobin less than 7 or actively bleeding.  Mild thrombocytopenia chronic.  INR 1.1.  Put on clears.  N.p.o. after midnight.  We will monitor H&H every 6 hourly.  Dr. Lyndel Safe was notified from the ED.  Elevated creatinine of 1.3.  Baseline creatinine around 1.0.  We will continue to monitor.  Patient will receive some IV fluids.  Mild thrombocytopenia.  Seems chronic.  We will continue to monitor.  Diabetes mellitus type 2 with hyperglycemia.  No evidence of DKA at this time.  We will continue with sliding scale insulin Accu-Cheks diabetic diet and IV fluids for hydration.  Patient is on Lantus 55 units daily at home with metformin and Actos at home.  Hold oral hypoglycemic agents.  Resume Lantus and sliding scale insulin for now.  Continue with IV fluid hydration.  Check hemoglobin A1c in AM.  Essential hypertension On Tribenzor at home will  hold for tonight.  Hyperlipidemia On Lipitor at home.  Will hold for now.  Chronic smoker.  Offered nicotine patch which he has declined.  Alcohol use disorder.  Patient has not had a drink in 1 week.  Usually takes 2 cans of beer in the evening but has no problems with it.  No history of withdrawals.  We will continue to monitor.  DVT Prophylaxis: SCD  Consultant: GI  Code Status: Full code  Microbiology none  Antibiotics: None  Family Communication:  Patients' condition and plan of care including tests being ordered have been discussed with the patient  who indicate understanding and agree with the plan.   Status is: Inpatient  Remains inpatient appropriate because: GI bleed need for further monitoring possibly endoscopic intervention   Severity of Illness: The appropriate patient status for this patient is INPATIENT. Inpatient status is judged to be reasonable and necessary in order to provide the required intensity of service to ensure the patient's safety. The patient's presenting symptoms, physical exam findings, and initial radiographic and laboratory data in the context of their chronic comorbidities is felt to place them at high risk for further clinical deterioration. Furthermore, it is not anticipated that the patient will be medically stable for discharge from the hospital within 2 midnights of admission.   I certify that at the point of admission it is my clinical judgment that the patient will require inpatient hospital care spanning beyond 2 midnights from the point of admission due to high intensity of service, high risk for further deterioration and high frequency of surveillance required.  Signed, Flora Lipps, MD [facility] 07/09/2021

## 2021-07-09 NOTE — Telephone Encounter (Signed)
Pt. Called back and said he was getting worried because he is still having a lot of blood in stool. Per your recommendation we told him to go to the ER with that much blood.

## 2021-07-09 NOTE — ED Provider Triage Note (Signed)
Emergency Medicine Provider Triage Evaluation Note  Micheal Leblanc , a 71 y.o. male  was evaluated in triage.  Pt complains of dark stools.  Patient states that yesterday he was getting off of work when he began having the urge to use the restroom.  He states that about 530 he had a bowel movement that was dark tarry stools.  He states that again at 8 PM, then at 2:30 in the morning and then at 7 AM this morning he again had episodes of dark tarry stool.  He denies this ever happening before.  Endorses mild lightheadedness but he attributes this to having decreased appetite and not eating today.  He denies anticoagulation use.  He denies abdominal pain, nausea, vomiting, fevers.  He denies any hematemesis, hematochezia.  Denies previous abdominal surgeries.  Denies chronic Tylenol or ibuprofen use.  He does state that he drinks about 2 glasses of gin a night.  Review of Systems  Positive: See above Negative:   Physical Exam  BP 128/73 (BP Location: Right Arm)    Pulse 89    Temp 97.9 F (36.6 C) (Oral)    Resp 18    SpO2 98%  Gen:   Awake, no distress   Resp:  Normal effort  MSK:   Moves extremities without difficulty  Other:  Abdomen is rounded, soft and nontender.  Positive bowel sounds.  Pulses intact bilaterally.  Medical Decision Making  Medically screening exam initiated at 10:34 AM.  Appropriate orders placed.  Micheal Leblanc was informed that the remainder of the evaluation will be completed by another provider, this initial triage assessment does not replace that evaluation, and the importance of remaining in the ED until their evaluation is complete.     Micheal Hillier, PA-C 07/09/21 1035

## 2021-07-09 NOTE — ED Provider Notes (Signed)
[facility] DEPT Provider Note   CSN: [id number] Arrival date & time: 07/09/21  0946     History  Chief Complaint  Patient presents with   Rectal Bleeding    Micheal Leblanc is a 71 y.o. male.  71 year old male presents with 1 day of rectal bleeding.  Does have a history of diverticulitis.  No prior history of same.  No associated abdominal discomfort.  No fever or emesis.  No weakness with standing.  Had a colonoscopy done 3 years ago which had a few polyps.  No history of colon cancer.  No treatment use prior to arrival      Home Medications Prior to Admission medications   Medication Sig Start Date End Date Taking? Authorizing Provider  Accu-Chek Softclix Lancets lancets Use as instructed 03/30/21   Denita Lung, MD  albuterol (VENTOLIN HFA) 108 (90 Base) MCG/ACT inhaler Inhale 1-2 puffs into the lungs every 6 (six) hours as needed for wheezing or shortness of breath. 01/22/20   Darr, Edison Nasuti, PA-C  atorvastatin (LIPITOR) 20 MG tablet TAKE 1 TABLET BY MOUTH EVERY DAY 04/03/21   Denita Lung, MD  erythromycin ophthalmic ointment Place 1 application into the right eye daily. Patient not taking: No sig reported 08/06/18   Chase Picket, MD  glucose blood test strip Use as instructed 03/30/21   Denita Lung, MD  Insulin Pen Needle (PEN NEEDLES) 31G X 8 MM MISC Use this for lantus solorstar pen 12/12/18   Wieters, [employer] C, PA-C  LANTUS SOLOSTAR 100 UNIT/ML Solostar Pen INJECT 55 UNITS INTO THE SKIN DAILY. 03/13/21 04/20/21  Denita Lung, MD  metFORMIN (GLUCOPHAGE-XR) 750 MG 24 hr tablet Take 1 tablet (750 mg total) by mouth 2 (two) times daily. 12/12/20   Denita Lung, MD  naproxen (NAPROSYN) 375 MG tablet Take 1 tablet (375 mg total) by mouth 2 (two) times daily. Patient not taking: No sig reported 12/12/18   Wieters, [employer], PA-C  Olmesartan-amLODIPine-HCTZ (TRIBENZOR) 40-5-25 MG TABS Take 1 tablet by mouth daily. 12/08/20   Denita Lung,  MD  pioglitazone (ACTOS) 30 MG tablet TAKE 1 TABLET BY MOUTH EVERY DAY 06/02/21   Denita Lung, MD  tadalafil (CIALIS) 20 MG tablet Take 1 tablet (20 mg total) by mouth daily as needed for erectile dysfunction. Patient not taking: Reported on 03/30/2021 12/08/20   Denita Lung, MD      Allergies    Patient has no known allergies.    Review of Systems   Review of Systems  All other systems reviewed and are negative.  Physical Exam Updated Vital Signs BP (!) 145/92 (BP Location: Right Arm)    Pulse 81    Temp 97.9 F (36.6 C) (Oral)    Resp 18    SpO2 100%  Physical Exam Vitals and nursing note reviewed.  Constitutional:      General: He is not in acute distress.    Appearance: Normal appearance. He is well-developed. He is not toxic-appearing.  HENT:     Head: Normocephalic and atraumatic.  Eyes:     General: Lids are normal.     Conjunctiva/sclera: Conjunctivae normal.     Pupils: Pupils are equal, round, and reactive to light.  Neck:     Thyroid: No thyroid mass.     Trachea: No tracheal deviation.  Cardiovascular:     Rate and Rhythm: Normal rate and regular rhythm.     Heart sounds: Normal heart  sounds. No murmur heard.   No gallop.  Pulmonary:     Effort: Pulmonary effort is normal. No respiratory distress.     Breath sounds: Normal breath sounds. No stridor. No decreased breath sounds, wheezing, rhonchi or rales.  Abdominal:     General: There is no distension.     Palpations: Abdomen is soft.     Tenderness: There is no abdominal tenderness. There is no rebound.  Genitourinary:    Comments: Gross blood noted on digital exam Musculoskeletal:        General: No tenderness. Normal range of motion.     Cervical back: Normal range of motion and neck supple.  Skin:    General: Skin is warm and dry.     Findings: No abrasion or rash.  Neurological:     Mental Status: He is alert and oriented to person, place, and time. Mental status is at baseline.     GCS: GCS  eye subscore is 4. GCS verbal subscore is 5. GCS motor subscore is 6.     Cranial Nerves: No cranial nerve deficit.     Sensory: No sensory deficit.     Motor: Motor function is intact.  Psychiatric:        Attention and Perception: Attention normal.        Speech: Speech normal.        Behavior: Behavior normal.    ED Results / Procedures / Treatments   Labs (all labs ordered are listed, but only abnormal results are displayed) Labs Reviewed  COMPREHENSIVE METABOLIC PANEL - Abnormal; Notable for the following components:      Result Value   Sodium 133 (*)    Glucose, Bld 526 (*)    BUN 26 (*)    Creatinine, Ser 1.31 (*)    Calcium 8.5 (*)    Total Protein 6.0 (*)    Albumin 3.4 (*)    GFR, Estimated 59 (*)    All other components within normal limits  CBC WITH DIFFERENTIAL/PLATELET - Abnormal; Notable for the following components:   RBC 3.92 (*)    Hemoglobin 11.4 (*)    HCT 35.0 (*)    Platelets 110 (*)    All other components within normal limits  RESP PANEL BY RT-PCR (FLU A&B, COVID) ARPGX2  PROTIME-INR  OCCULT BLOOD X 1 CARD TO LAB, STOOL  TYPE AND SCREEN  ABO/RH    EKG None  Radiology No results found.  Procedures Procedures    Medications Ordered in ED Medications - No data to display  ED Course/ Medical Decision Making/ A&P                           Medical Decision Making  Patient with likely lower GI bleed.  Review of our records show that patient has had a several gram drop of his hemoglobin.  Patient also thrombocytopenic with platelets of 110,000.  Patient also noted to be hyperglycemic here with blood sugar in the 500s.  He has no signs of DKA.  Will be given IV fluids here as well as regular insulin.  He has been hemodynamically stable here.  Secure chat sent to GI physician on-call to help with management.  Will consult hospitalist for admission        Final Clinical Impression(s) / ED Diagnoses Final diagnoses:  None    Rx / DC  Orders ED Discharge Orders     None  Lacretia Leigh, MD 07/09/21 1719

## 2021-07-10 ENCOUNTER — Observation Stay (HOSPITAL_COMMUNITY): Payer: Medicare Other

## 2021-07-10 ENCOUNTER — Ambulatory Visit: Payer: Medicare Other | Admitting: Medical

## 2021-07-10 DIAGNOSIS — F17210 Nicotine dependence, cigarettes, uncomplicated: Secondary | ICD-10-CM

## 2021-07-10 DIAGNOSIS — D696 Thrombocytopenia, unspecified: Secondary | ICD-10-CM | POA: Diagnosis not present

## 2021-07-10 DIAGNOSIS — E1169 Type 2 diabetes mellitus with other specified complication: Secondary | ICD-10-CM

## 2021-07-10 DIAGNOSIS — K828 Other specified diseases of gallbladder: Secondary | ICD-10-CM | POA: Diagnosis not present

## 2021-07-10 DIAGNOSIS — K921 Melena: Secondary | ICD-10-CM | POA: Diagnosis not present

## 2021-07-10 DIAGNOSIS — K7689 Other specified diseases of liver: Secondary | ICD-10-CM | POA: Diagnosis not present

## 2021-07-10 DIAGNOSIS — E785 Hyperlipidemia, unspecified: Secondary | ICD-10-CM

## 2021-07-10 DIAGNOSIS — K760 Fatty (change of) liver, not elsewhere classified: Secondary | ICD-10-CM | POA: Diagnosis not present

## 2021-07-10 DIAGNOSIS — K922 Gastrointestinal hemorrhage, unspecified: Secondary | ICD-10-CM | POA: Diagnosis not present

## 2021-07-10 DIAGNOSIS — I10 Essential (primary) hypertension: Secondary | ICD-10-CM | POA: Diagnosis not present

## 2021-07-10 DIAGNOSIS — D62 Acute posthemorrhagic anemia: Secondary | ICD-10-CM

## 2021-07-10 DIAGNOSIS — K625 Hemorrhage of anus and rectum: Secondary | ICD-10-CM | POA: Diagnosis not present

## 2021-07-10 LAB — BASIC METABOLIC PANEL
Anion gap: 5 (ref 5–15)
BUN: 21 mg/dL (ref 8–23)
CO2: 25 mmol/L (ref 22–32)
Calcium: 8.3 mg/dL — ABNORMAL LOW (ref 8.9–10.3)
Chloride: 109 mmol/L (ref 98–111)
Creatinine, Ser: 0.94 mg/dL (ref 0.61–1.24)
GFR, Estimated: >60 mL/min (ref >60)
Glucose, Bld: 178 mg/dL — ABNORMAL HIGH (ref 70–99)
Potassium: 3.6 mmol/L (ref 3.5–5.1)
Sodium: 139 mmol/L (ref 135–145)

## 2021-07-10 LAB — HEMOGLOBIN A1C
Hgb A1c MFr Bld: 9.2 % — ABNORMAL HIGH (ref 4.8–5.6)
Mean Plasma Glucose: 217.34 mg/dL

## 2021-07-10 LAB — CBC
HCT: 30.4 % — ABNORMAL LOW (ref 39.0–52.0)
Hemoglobin: 10.1 g/dL — ABNORMAL LOW (ref 13.0–17.0)
MCH: 29.6 pg (ref 26.0–34.0)
MCHC: 33.2 g/dL (ref 30.0–36.0)
MCV: 89.1 fL (ref 80.0–100.0)
Platelets: 97 10*3/uL — ABNORMAL LOW (ref 150–400)
RBC: 3.41 MIL/uL — ABNORMAL LOW (ref 4.22–5.81)
RDW: 13.6 % (ref 11.5–15.5)
WBC: 6.3 10*3/uL (ref 4.0–10.5)
nRBC: 0 % (ref 0.0–0.2)

## 2021-07-10 LAB — ABO/RH: ABO/RH(D): O POS

## 2021-07-10 LAB — GLUCOSE, CAPILLARY
Glucose-Capillary: 165 mg/dL — ABNORMAL HIGH (ref 70–99)
Glucose-Capillary: 81 mg/dL (ref 70–99)
Glucose-Capillary: 243 mg/dL — ABNORMAL HIGH (ref 70–99)
Glucose-Capillary: 213 mg/dL — ABNORMAL HIGH (ref 70–99)

## 2021-07-10 LAB — PHOSPHORUS: Phosphorus: 2.8 mg/dL (ref 2.5–4.6)

## 2021-07-10 LAB — HEMOGLOBIN AND HEMATOCRIT, BLOOD
HCT: 30 % — ABNORMAL LOW (ref 39.0–52.0)
Hemoglobin: 9.7 g/dL — ABNORMAL LOW (ref 13.0–17.0)

## 2021-07-10 LAB — MAGNESIUM: Magnesium: 2.1 mg/dL (ref 1.7–2.4)

## 2021-07-10 LAB — HIV ANTIBODY (ROUTINE TESTING W REFLEX): HIV Screen 4th Generation wRfx: NONREACTIVE

## 2021-07-10 MED ORDER — MELATONIN 5 MG PO TABS
5.0000 mg | ORAL_TABLET | Freq: Every evening | ORAL | Status: DC | PRN
  Administered 2021-07-10: 5 mg via ORAL
  Filled 2021-07-10: qty 1

## 2021-07-10 NOTE — Plan of Care (Signed)
  Problem: Education: Goal: Knowledge of General Education information will improve Description: Including pain rating scale, medication(s)/side effects and non-pharmacologic comfort measures Outcome: Progressing   Problem: Clinical Measurements: Goal: Will remain free from infection Outcome: Progressing   Problem: Clinical Measurements: Goal: Diagnostic test results will improve Outcome: Progressing   

## 2021-07-10 NOTE — Care Management Obs Status (Signed)
[place] NOTIFICATION   Patient Details  Name: Micheal Leblanc MRN: [id number] Date of Birth: Nov 05, 1950   Medicare Observation Status Notification Given:  Yes    Purcell Mouton, RN 07/10/2021, 3:36 PM

## 2021-07-10 NOTE — Consult Note (Addendum)
Consultation  Referring Provider:     Dr. Louanne Belton  Primary Care Physician:  Denita Lung, MD Primary Gastroenterologist:  Dr. Ardis Hughs       Reason for Consultation:     Hematochezia    Impression [name]:    Painless hematochezia hemodynamically stable BUN 26 (19), creatinine 1.3 (1.13)- mild elevation of both BUN and Cr FOBT positive, 6 g drop of hemoglobin compared to baseline in June 2019 colonoscopy had 2 minuscule polyps, recall 2024, diverticulosis throughout With presentation most likely diverticular bleed Did have some dark black stool this morning, most likely old blood and points towards no new bleeding, low likelihood brisk upper GI bleed with EtOH history, normal liver function, mild thrombocytopenia, normal INR,no UGI symptoms.  --Continue to monitor H&H with transfusion as needed to maintain hemoglobin greater than 7. -Monitor for any new bleeding today, possible DC tomorrow, if patient rebleeds recommend CTA with IR consultation if positive. -Can consider colonoscopy, potential upper endoscopy pending AB US/any rebleeding, can also discuss out patient. Further recommendations per Dr. Carlean Purl.   Thrombocytopenia With EtOH history, schedule for abdominal ultrasound looking for splenomegaly, portal hypertension, cirrhosis. INR 1.1  Other comorbidities Type 2 diabetes with hyperglycemia Hypertension Hyperlipidemia   Thank you for your kind consultation, we will continue to follow.    [facility] Attending   I have taken an interval history, reviewed the chart and examined the patient. I agree with the Advanced Practitioner's note, impression and recommendations.  Majority the medical decision-making in the formulation of the assessment and plan were performed by me.   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 Doubt significant liver disease but assess w/ [place]. That will probably be done tomorrow as it stands now.  If no further  bleeding into tomorrow and Hgb stable can probably go home and we will have him f/u w/ [place]. If that is not the case will plan additional evaluation as outlined above.  Gatha Mayer, MD, [name] 07/10/2021 2:30 PM    HPI:   Micheal Leblanc is a 71 y.o. male with medical history significant for hypertension, hyperlipidemia, type 2 diabetes without complication, tobacco use/alcohol use, history of colon polyps and diverticulitis presents to the ER with rectal bleeding. Last colonoscopy 2019 with Dr. Ardis Hughs showed 2 minuscule polyps transverse ascending colon tubular adenomas recall 5 years, and diverticulosis throughout entire colon.  Patient lying in bed comfortably when seen, states 4 episodes total of dark clots mixed with bright red blood starting 2 days ago, had episode of smaller bright red blood 730 yesterday when he presented to the ER.  Has had 1 more episode of formed tarry black stool this morning. Has never had an episode like this prior. Denies any abdominal discomfort. Patient denies blood thinners, anti-inflammatories.  Patient does drink at least 2-3 liquor or beers daily for 10 to 20 years, last drink was Sunday.  Denies ever having tremors or DTs. Patient denies reflux, nausea, vomiting, dysphagia or odynophagia. Denies fever chills Denies shortness of breath, chest pain, dizziness, syncope.   ED course: Positive FOBT with gross blood, hemoglobin 11.4 compared to 17.5 on 12/08/2020, recheck at 10.1.INR 1.1 Sodium 133, glucose 526 admitting repeat 178, BUN 26 (19), creatinine 1.3 (1.13) Patient has been hemodynamically stable, has received 1 L of fluids, is not required blood transfusion.  Previous GI work up: Colonoscopy 09/2017 - Two 2 to 3 mm polyps in the transverse colon and in  the ascending colon, removed with a cold snare. Resected and retrieved. Pathology tubular adenoma, recall 5 years - Diverticulosis in the entire examined colon. - The  examination was otherwise normal on direct and retroflexion views.  06/19/2014 Colonoscopy Three polyps were found, removed and sent to pathology. 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 There were medium sized internal and external hemorrhoids  Past Medical History:  Diagnosis Date   Allergy    Diabetes mellitus    ED (erectile dysfunction)    Hx of colonic polyps    Hyperlipidemia    Hypertension    Smoker     Surgical History:  He  has a past surgical history that includes Colonoscopy and Polypectomy. Family History:  His family history includes Arthritis in his mother. Social History:   reports that he has been smoking cigarettes. He has been smoking an average of .5 packs per day. He has never used smokeless tobacco. He reports current alcohol use of about 4.0 standard drinks per week. He reports that he does not use drugs.  Prior to Admission medications   Medication Sig Start Date End Date Taking? Authorizing Provider  albuterol (VENTOLIN HFA) 108 (90 Base) MCG/ACT inhaler Inhale 1-2 puffs into the lungs every 6 (six) hours as needed for wheezing or shortness of breath. 01/22/20  Yes Darr, Edison Nasuti, PA-C  atorvastatin (LIPITOR) 20 MG tablet TAKE 1 TABLET BY MOUTH EVERY DAY Patient taking differently: Take 20 mg by mouth daily. 04/03/21  Yes Denita Lung, MD  LANTUS SOLOSTAR 100 UNIT/ML Solostar Pen INJECT 55 UNITS INTO THE SKIN DAILY. 03/13/21 07/09/21 Yes Denita Lung, MD  metFORMIN (GLUCOPHAGE-XR) 750 MG 24 hr tablet Take 1 tablet (750 mg total) by mouth 2 (two) times daily. Patient taking differently: Take 750 mg by mouth daily. 12/12/20  Yes Denita Lung, MD  Olmesartan-amLODIPine-HCTZ [facility]) 40-5-25 MG TABS Take 1 tablet by mouth daily. 12/08/20  Yes Denita Lung, MD  pioglitazone (ACTOS) 30 MG tablet TAKE 1 TABLET BY MOUTH EVERY DAY Patient taking differently: Take 30 mg by mouth daily. 06/02/21  Yes Denita Lung, MD  Accu-Chek Softclix  Lancets lancets Use as instructed 03/30/21   Denita Lung, MD  erythromycin ophthalmic ointment Place 1 application into the right eye daily. Patient not taking: Reported on 12/08/2020 08/06/18   Chase Picket, MD  glucose blood test strip Use as instructed 03/30/21   Denita Lung, MD  Insulin Pen Needle (PEN NEEDLES) 31G X 8 MM MISC Use this for lantus solorstar pen 12/12/18   Wieters, Hallie C, PA-C  naproxen (NAPROSYN) 375 MG tablet Take 1 tablet (375 mg total) by mouth 2 (two) times daily. Patient not taking: Reported on 12/08/2020 12/12/18   Wieters, Hallie C, PA-C  tadalafil (CIALIS) 20 MG tablet Take 1 tablet (20 mg total) by mouth daily as needed for erectile dysfunction. Patient not taking: Reported on 03/30/2021 12/08/20   Denita Lung, MD    Current Facility-Administered Medications  Medication Dose Route Frequency Provider Last Rate Last Admin   0.9 %  sodium chloride infusion   Intravenous Continuous Flora Lipps, MD 125 mL/hr at 07/10/21 0432 New Bag at 07/10/21 0432   acetaminophen (TYLENOL) tablet 650 mg  650 mg Oral Q6H PRN Pokhrel, Laxman, MD       Or   acetaminophen (TYLENOL) suppository 650 mg  650 mg Rectal Q6H PRN Pokhrel, Laxman, MD       albuterol (PROVENTIL) (2.5 MG/3ML) 0.083% nebulizer  solution 3 mL  3 mL Inhalation Q6H PRN Pokhrel, Laxman, MD       hydrALAZINE (APRESOLINE) injection 10 mg  10 mg Intravenous Q6H PRN Pokhrel, Laxman, MD       insulin aspart (novoLOG) injection 0-15 Units  0-15 Units Subcutaneous TID WC Pokhrel, Laxman, MD       insulin aspart (novoLOG) injection 0-5 Units  0-5 Units Subcutaneous QHS Pokhrel, Laxman, MD   3 Units at 07/09/21 2204   insulin glargine-yfgn (SEMGLEE) injection 20 Units  20 Units Subcutaneous QHS Pokhrel, Laxman, MD   20 Units at 07/09/21 2328   ondansetron (ZOFRAN) tablet 4 mg  4 mg Oral Q6H PRN Pokhrel, Laxman, MD       Or   ondansetron (ZOFRAN) injection 4 mg  4 mg Intravenous Q6H PRN Pokhrel, Laxman, MD        pantoprazole (PROTONIX) injection 40 mg  40 mg Intravenous Q12H Pokhrel, Laxman, MD   40 mg at 07/09/21 2204    Allergies as of 07/09/2021   (No Known Allergies)    Review of Systems:    Constitutional: No weight loss, fever, chills, weakness or fatigue HEENT: Eyes: No change in vision               Ears, Nose, Throat:  No change in hearing or congestion Skin: No rash or itching Cardiovascular: No chest pain, chest pressure or palpitations   Respiratory: No SOB or cough Gastrointestinal: See HPI and otherwise negative Genitourinary: No dysuria or change in urinary frequency Neurological: No headache, dizziness or syncope Musculoskeletal: No new muscle or joint pain Hematologic: No bleeding or bruising Psychiatric: No history of depression or anxiety     Physical Exam:  Vital signs in last 24 hours: Temp:  [97.9 F (36.6 C)-98.2 F (36.8 C)] 98.1 F (36.7 C) (01/13 0433) Pulse Rate:  [59-89] 69 (01/13 0433) Resp:  [15-18] 16 (01/13 0433) BP: (113-145)/(60-96) 124/72 (01/13 0433) SpO2:  [97 %-100 %] 98 % (01/13 0433) Weight:  [76.7 kg] 76.7 kg (01/13 0024)    General:   Pleasant, AA male in no acute distress Head:  Normocephalic and atraumatic. Eyes: sclerae anicteric,conjunctive pink  Heart:  regular rate and rhythm, no murmurs or gallops Pulm: Clear anteriorly; no wheezing Abdomen:  Soft,  protuberent  AB, Normal bowel sounds.  no  tenderness Without guarding and Without rebound, without hepatomegaly. Extremities:  Without edema. Msk:  Symmetrical without gross deformities. Peripheral pulses intact.  Neurologic:  Alert and  oriented x4;  grossly normal neurologically. Skin:   Dry and intact without significant lesions or rashes. Psychiatric: Demonstrates good judgement and reason without abnormal affect or behaviors.  LAB RESULTS: Recent Labs    07/09/21 1047 07/09/21 1828 07/10/21 0029  WBC 7.0  --  6.3  HGB 11.4* 10.3* 10.1*  HCT 35.0* 31.3* 30.4*  PLT 110*   --  97*   BMET Recent Labs    07/09/21 1047 07/10/21 0029  NA 133* 139  K 4.3 3.6  CL 102 109  CO2 24 25  GLUCOSE 526* 178*  BUN 26* 21  CREATININE 1.31* 0.94  CALCIUM 8.5* 8.3*   LFT Recent Labs    07/09/21 1047  PROT 6.0*  ALBUMIN 3.4*  AST 19  ALT 24  ALKPHOS 74  BILITOT 0.8   PT/INR Recent Labs    07/09/21 1047  LABPROT 13.7  INR 1.1       Vladimir Crofts  07/10/2021, 8:15 AM

## 2021-07-10 NOTE — Progress Notes (Signed)
Inpatient Diabetes Program Recommendations  AACE/ADA: New Consensus Statement on Inpatient Glycemic Control (2015)  Target Ranges:  Prepandial:   less than 140 mg/dL      Peak postprandial:   less than 180 mg/dL (1-2 hours)      Critically ill patients:  140 - 180 mg/dL   Lab Results  Component Value Date   GLUCAP 81 07/10/2021   HGBA1C 9.2 (H) 07/10/2021    Review of Glycemic Control  Diabetes history: DM2 Outpatient Diabetes medications: Lantus 55 QD, metformin 750 mg BID Current orders for Inpatient glycemic control: Semglee 20 QHS, Novolog 0-15 units TID with meals and 0-5 HS  9.2% On FL diet  Spoke with pt at bedside regarding his diabetes. States he hasn't been checking blood sugars like he should. Takes his Lantus "most of the time" but does miss it sometimes. Said he needed to get back on track with monitoring and taking insulin as prescribed. His PCP wants him to check blood sugars 2H after meals, to see if he needs rapid-acting insulin at home. Said he's hungry and eating FL diet now. HgbA1C - 9.1%. Trying to improve this down to 7.5-8%.  Inpatient Diabetes Program Recommendations:    Agree with orders.  Follow closely.  Thank you. Lorenda Peck, RD, LDN, CDE Inpatient Diabetes Coordinator [phone]

## 2021-07-10 NOTE — Progress Notes (Signed)
PROGRESS NOTE  Micheal Leblanc JXB:[id number] DOB: 14-Sep-1950 DOA: 07/09/2021 PCP: Denita Lung, MD   LOS: 1 day   Brief narrative: Micheal Leblanc is a 71 y.o. male with past medical history of allergy, diabetes mellitus,  hyperlipidemia, hypertension, presented to the hospital with rectal bleeding multiple episodes prior to coming to the hospital and also after coming to the ED which was painless.  History of colonoscopy 3 years back with notable polyp.  Patient denied any NSAIDs but drinks alcohol.  In the ED patient's hemoglobin was 11.4.  He was hemodynamically stable. Sodium of 133 with a blood glucose level elevated at 526.  BUN 26 with creatinine of 1.3.  Patient was then then admitted to hospital for acute GI bleed.   Assessment/Plan:  Principal Problem:   GI bleed  GI bleed/hematochezia. .  Hemoglobin 7 months back was 17.5, hemoglobin today at 9.2.  Patient remains hemodynamically stable at this time.  Likely lower GI bleed.   No history of GI bleed in the past.  Continue on Protonix, IV fluids. Patient does have  thrombocytopenia chronic.  INR 1.1.  GI on board   Received IV fluid hydration overnight.   Elevated creatinine of 1.3 likely mild acute kidney injury secondary to volume depletion. Baseline creatinine around 1.0.  Improved with hydration.    Mild thrombocytopenia.  Seems chronic.  We will continue to monitor.  Latest platelet count of 97   Diabetes mellitus type 2 with hyperglycemia.   No evidence of DKA.  Continue sliding scale insulin, Accu-Cheks, diabetic diet and IV fluids.  on Lantus 55 units daily at home with metformin and Actos. Hold oral hypoglycemic agents.  Resumed long-acting at lower dose.  Patient is currently NPO.  We will continue sliding scale insulin for now.    Continue with IV fluid hydration.  Hemoglobin A1c of 9.2.  Essential hypertension On Tribenzor at home will continue to hold today.  Hyperlipidemia On Lipitor at home.  Will hold for  now.   Chronic smoker.  Offered nicotine patch which he has declined.   Alcohol use disorder.  Patient has not had a drink in 1 week.  Usually takes 2 cans of beer in the evening but has no problems with it.  No history of withdrawals.  We will continue to monitor.  DVT prophylaxis: SCDs Start: 07/09/21 1828   Code Status: Full code  Family Communication: Discussed with the patient at bedside  Status is: Inpatient  Remains inpatient appropriate because: GI bleed, possible need for endoscopic evaluation.  Further monitoring.  Consultants: GI  Procedures: None yet  Anti-infectives:  None  Anti-infectives (From admission, onward)    None       Subjective: Today, patient was seen and examined at bedside.  Denies any dizziness, lightheadedness, shortness of breath, chest pain or palpitation.  He did have some black tarry stool this morning  Objective: Vitals:   07/10/21 0825 07/10/21 1210  BP: 124/82 129/80  Pulse: 74 62  Resp: 18 19  Temp: 98.7 F (37.1 C) 98.2 F (36.8 C)  SpO2: 99% 100%    Intake/Output Summary (Last 24 hours) at 07/10/2021 1218 Last data filed at 07/10/2021 0600 Gross per 24 hour  Intake 1117.09 ml  Output --  Net 1117.09 ml   Filed Weights   07/10/21 0024  Weight: 76.7 kg   Body mass index is 23.58 kg/m.   Physical Exam: GENERAL: Patient is alert awake and oriented. Not in obvious distress. HENT:  No scleral pallor or icterus. Pupils equally reactive to light. Oral mucosa is moist NECK: is supple, no gross swelling noted. CHEST: Clear to auscultation. No crackles or wheezes.  Diminished breath sounds bilaterally. CVS: S1 and S2 heard, no murmur. Regular rate and rhythm.  ABDOMEN: Soft, non-tender, bowel sounds are present. EXTREMITIES: No edema. CNS: Cranial nerves are intact. No focal motor deficits. SKIN: warm and dry without rashes.  Data Review: I have personally reviewed the following laboratory data and  studies,  CBC: Recent Labs  Lab 07/09/21 1047 07/09/21 1828 07/10/21 0029  WBC 7.0  --  6.3  NEUTROABS 4.1  --   --   HGB 11.4* 10.3* 10.1*  HCT 35.0* 31.3* 30.4*  MCV 89.3  --  89.1  PLT 110*  --  97*   Basic Metabolic Panel: Recent Labs  Lab 07/09/21 1047 07/10/21 0029  NA 133* 139  K 4.3 3.6  CL 102 109  CO2 24 25  GLUCOSE 526* 178*  BUN 26* 21  CREATININE 1.31* 0.94  CALCIUM 8.5* 8.3*  MG  --  2.1  PHOS  --  2.8   Liver Function Tests: Recent Labs  Lab 07/09/21 1047  AST 19  ALT 24  ALKPHOS 74  BILITOT 0.8  PROT 6.0*  ALBUMIN 3.4*   No results for input(s): LIPASE, AMYLASE in the last 168 hours. No results for input(s): AMMONIA in the last 168 hours. Cardiac Enzymes: No results for input(s): CKTOTAL, CKMB, CKMBINDEX, TROPONINI in the last 168 hours. BNP (last 3 results) No results for input(s): BNP in the last 8760 hours.  ProBNP (last 3 results) No results for input(s): PROBNP in the last 8760 hours.  CBG: Recent Labs  Lab 07/09/21 2151 07/10/21 0741 07/10/21 1204  GLUCAP 253* 165* 81   Recent Results (from the past 240 hour(s))  Resp Panel by RT-PCR (Flu A&B, Covid) Nasopharyngeal Swab     Status: None   Collection Time: 07/09/21  5:15 PM   Specimen: Nasopharyngeal Swab; Nasopharyngeal(NP) swabs in vial transport medium  Result Value Ref Range Status   SARS Coronavirus 2 by RT PCR NEGATIVE NEGATIVE Final    Comment: (NOTE) SARS-CoV-2 target nucleic acids are NOT DETECTED.  The SARS-CoV-2 RNA is generally detectable in upper respiratory specimens during the acute phase of infection. The lowest concentration of SARS-CoV-2 viral copies this assay can detect is 138 copies/mL. A negative result does not preclude SARS-Cov-2 infection and should not be used as the sole basis for treatment or other patient management decisions. A negative result may occur with  improper specimen collection/handling, submission of specimen other than  nasopharyngeal swab, presence of viral mutation(s) within the areas targeted by this assay, and inadequate number of viral copies(<138 copies/mL). A negative result must be combined with clinical observations, patient history, and epidemiological information. The expected result is Negative.  Fact Sheet for Patients:  EntrepreneurPulse.com.au  Fact Sheet for Healthcare Providers:  IncredibleEmployment.be  This test is no t yet approved or cleared by the [place] FDA and  has been authorized for detection and/or diagnosis of SARS-CoV-2 by FDA under an Emergency Use Authorization (EUA). This EUA will remain  in effect (meaning this test can be used) for the duration of the COVID-19 declaration under Section 564(b)(1) of the Act, 21 U.S.C.section 360bbb-3(b)(1), unless the authorization is terminated  or revoked sooner.       Influenza A by PCR NEGATIVE NEGATIVE Final   Influenza B by PCR NEGATIVE NEGATIVE Final  Comment: (NOTE) The Xpert Xpress SARS-CoV-2/FLU/RSV plus assay is intended as an aid in the diagnosis of influenza from Nasopharyngeal swab specimens and should not be used as a sole basis for treatment. Nasal washings and aspirates are unacceptable for Xpert Xpress SARS-CoV-2/FLU/RSV testing.  Fact Sheet for Patients: EntrepreneurPulse.com.au  Fact Sheet for Healthcare Providers: IncredibleEmployment.be  This test is not yet approved or cleared by the [place] FDA and has been authorized for detection and/or diagnosis of SARS-CoV-2 by FDA under an Emergency Use Authorization (EUA). This EUA will remain in effect (meaning this test can be used) for the duration of the COVID-19 declaration under Section 564(b)(1) of the Act, 21 U.S.C. section 360bbb-3(b)(1), unless the authorization is terminated or revoked.  Performed at [facility], [place] [address], [place],  [address]      Studies: No results found.    Flora Lipps, MD  [facility] 07/10/2021  If 7PM-7AM, please contact night-coverage

## 2021-07-10 NOTE — Care Management CC44 (Signed)
Condition Code 44 Documentation Completed  Patient Details  Name: Micheal Leblanc MRN: [id number] Date of Birth: 1950-07-05   Condition Code 44 given:  Yes Patient signature on Condition Code 44 notice:  Yes Documentation of 2 MD's agreement:  Yes Code 44 added to claim:  Yes    Purcell Mouton, RN 07/10/2021, 3:36 PM

## 2021-07-10 NOTE — TOC Progression Note (Signed)
Transition of Care [facility]) - Progression Note    Patient Details  Name: Micheal Leblanc MRN: [id number] Date of Birth: 03-Apr-1951  Transition of Care [facility]) CM/SW Contact  Purcell Mouton, RN Phone Number: 07/10/2021, 3:43 PM  Clinical Narrative:     From home alone.  Expected Discharge Plan: Home/Self Care Barriers to Discharge: No Barriers Identified  Expected Discharge Plan and Services Expected Discharge Plan: Home/Self Care       Living arrangements for the past 2 months: Single Family Home                                       Social Determinants of Health (SDOH) Interventions    Readmission Risk Interventions No flowsheet data found.

## 2021-07-11 ENCOUNTER — Other Ambulatory Visit: Payer: Self-pay | Admitting: Internal Medicine

## 2021-07-11 DIAGNOSIS — D649 Anemia, unspecified: Secondary | ICD-10-CM

## 2021-07-11 DIAGNOSIS — D62 Acute posthemorrhagic anemia: Secondary | ICD-10-CM | POA: Diagnosis not present

## 2021-07-11 DIAGNOSIS — F1010 Alcohol abuse, uncomplicated: Secondary | ICD-10-CM | POA: Diagnosis not present

## 2021-07-11 DIAGNOSIS — K922 Gastrointestinal hemorrhage, unspecified: Secondary | ICD-10-CM | POA: Diagnosis not present

## 2021-07-11 DIAGNOSIS — K625 Hemorrhage of anus and rectum: Secondary | ICD-10-CM | POA: Diagnosis not present

## 2021-07-11 DIAGNOSIS — K921 Melena: Secondary | ICD-10-CM | POA: Diagnosis not present

## 2021-07-11 DIAGNOSIS — K760 Fatty (change of) liver, not elsewhere classified: Secondary | ICD-10-CM

## 2021-07-11 DIAGNOSIS — D696 Thrombocytopenia, unspecified: Secondary | ICD-10-CM | POA: Diagnosis not present

## 2021-07-11 LAB — GLUCOSE, CAPILLARY: Glucose-Capillary: 92 mg/dL (ref 70–99)

## 2021-07-11 LAB — HEMOGLOBIN AND HEMATOCRIT, BLOOD
HCT: 28.5 % — ABNORMAL LOW (ref 39.0–52.0)
Hemoglobin: 9.3 g/dL — ABNORMAL LOW (ref 13.0–17.0)

## 2021-07-11 MED ORDER — LANTUS SOLOSTAR 100 UNIT/ML ~~LOC~~ SOPN: 55.0000 [IU] | PEN_INJECTOR | Freq: Every day | SUBCUTANEOUS | 3 refills | Status: DC

## 2021-07-11 MED ORDER — PANTOPRAZOLE SODIUM 40 MG PO TBEC: 40.0000 mg | DELAYED_RELEASE_TABLET | Freq: Every day | ORAL | 0 refills | Status: DC

## 2021-07-11 NOTE — Progress Notes (Addendum)
° °  Patient Name: Micheal Leblanc Date of Encounter: 07/11/2021, 8:56 AM    Subjective  No stools since yesterday   Objective  BP 130/86 (BP Location: Left Arm)    Pulse 73    Temp 98.2 F (36.8 C) (Oral)    Resp (!) 21    Ht 5\' 11"  (1.803 m)    Wt 78 kg    SpO2 99%    BMI 23.98 kg/m  NAD Lungs cta Cor NL S1S2 Abd soft and NT  CBC Latest Ref Rng & Units 07/11/2021 07/10/2021 07/10/2021  WBC 4.0 - 10.5 K/uL - - 6.3  Hemoglobin 13.0 - 17.0 g/dL 9.3(L) 9.7(L) 10.1(L)  Hematocrit 39.0 - 52.0 % 28.5(L) 30.0(L) 30.4(L)  Platelets 150 - 400 K/uL - - 97(L)    Recent Labs  Lab 07/09/21 1047 07/10/21 0029  NA 133* 139  K 4.3 3.6  CL 102 109  CO2 24 25  GLUCOSE 526* 178*  BUN 26* 21  CREATININE 1.31* 0.94  CALCIUM 8.5* 8.3*  MG  --  2.1  PHOS  --  2.8    [place] 1/13 Probably fatty liver, no cirrhosis no splenomegaly    Assessment and Plan  GI bleed which has stopped - Hgb decrease w/ hydration - no stools x 24+ hrs - think colonic diverticular hemorrhage  OK to dc today We will make outpt f/u I have ordered for him to get a CBC, B12 and ferritin 1/17 and we will coordinate provider visit from there No NSAID/ASA (dc naproxen) PPI qd x 1 month in case had some upper GI bleeding   Thrombocytopenia - chronic and mild - no advanced liver dz/portal htn on [place]  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

## 2021-07-11 NOTE — Progress Notes (Signed)
Patient discharged home per order.  VSS, ambulatory, wishes to walk to lobby; reviewed AVS with patient and he verbalizes understanding of instructions.  IV removed.  No distress.

## 2021-07-11 NOTE — Discharge Summary (Signed)
Physician Discharge Summary  Micheal Leblanc:[id number] DOB: 1950/09/17 DOA: 07/09/2021  PCP: Denita Lung, MD  Admit date: 07/09/2021 Discharge date: 07/11/2021  Admitted From: Home  Discharge disposition: Home   Recommendations for Outpatient Follow-Up:   Follow up with your primary care provider in one week.  Check CBC, BMP, magnesium in the next visit Follow-up up with GI clinic as scheduled by the clinic.  Patient to follow-up on 07/14/2021 for blood work.  Discharge Diagnosis:   Principal Problem:   GI bleed Active Problems:   Hematochezia   Thrombocytopenia (HCC)   Acute blood loss anemia  Discharge Condition: Improved.  Diet recommendation: Regular.  Wound care: None.  Code status: Full.   History of Present Illness:   Micheal Leblanc is a 71 y.o. male with past medical history of allergy, diabetes mellitus,  hyperlipidemia, hypertension, presented to the hospital with rectal bleeding multiple episodes prior to coming to the hospital and also after coming to the ED which was painless.  History of colonoscopy 3 years back with  polyp.  Patient denied any NSAIDs but drinks alcohol.  In the ED, patient's hemoglobin was 11.4.  He was hemodynamically stable. Sodium of 133 with a blood glucose level elevated at 526.  BUN 26 with creatinine of 1.3.  Patient was then then admitted to hospital for acute GI bleed.   Hospital Course:   Following conditions were addressed during hospitalization as listed below,  GI bleed/hematochezia likely secondary to diverticulosis..  Hemoglobin 7 months back was 17.5, hemoglobin prior to discharge was 9.3 but patient received IV fluid hydration during hospitalization.  Could be component of dilution as well.  Patient remained hemodynamically stable.  Was seen by GI.  Thought to be diverticular bleed.  GI recommends Protonix for 1 month.  Patient was advised to avoid constipation.  Patient does have mild thrombocytopenia at  baseline.    Mild acute kidney injury secondary to volume depletion. Baseline creatinine around 1.0.  Improved with hydration.  Initial creatinine was 1.3.  Creatinine prior to discharge was 0.9.   Mild thrombocytopenia.  Seems chronic.  We will continue to monitor.  Latest platelet count of 97.  Right upper quadrant ultrasound showed fatty liver.  Patient will follow up with GI as outpatient.   Diabetes mellitus type 2 with hyperglycemia.   On presentation.  No evidence of DKA.  Patient was continued on insulin regimen while in the hospital with IV fluids with improved.  Hemoglobin A1c of 9.2.  Patient was encouraged to watch his glucose control at home and continue home regimen including insulin and metformin and Actos.  Glucose prior to discharge was 92   Essential hypertension On Tribenzor at home, will be resumed on discharge.   Hyperlipidemia  Continue Lipitor   Chronic smoker.  Offered nicotine patch which he has declined.   Alcohol use disorder.  Patient has not had a drink in 1 week.  Usually takes 2 cans of beer in the evening but has no problems with it as per the patient.  He was encouraged to remain abstinent.  He did not have any withdrawal symptoms during hospitalization.    Disposition.  At this time, patient is stable for disposition home with outpatient PCP and GI follow-up.  Medical Consultants:    GI Procedures:    None Subjective:   Today, patient was seen and examined at bedside.  Denies any dizziness lightheadedness shortness of breath chest pain palpitation.  Has not had a  bowel movement since yesterday.  Discharge Exam:   Vitals:   07/11/21 0440 07/11/21 0500  BP: 130/86   Pulse: 73   Resp: (!) 49 (!) 21  Temp: 98.2 F (36.8 C)   SpO2: 99%    Vitals:   07/11/21 0400 07/11/21 0440 07/11/21 0500 07/11/21 0600  BP:  130/86    Pulse:  73    Resp: (!) 21 (!) 49 (!) 21   Temp:  98.2 F (36.8 C)    TempSrc:  Oral    SpO2:  99%    Weight:    78 kg   Height:       General: Alert awake, not in obvious distress HENT: pupils equally reacting to light,  No scleral pallor or icterus noted. Oral mucosa is moist.  Chest:  Clear breath sounds.  Diminished breath sounds bilaterally. No crackles or wheezes.  CVS: S1 &S2 heard. No murmur.  Regular rate and rhythm. Abdomen: Soft, nontender, nondistended.  Bowel sounds are heard.   Extremities: No cyanosis, clubbing or edema.  Peripheral pulses are palpable. Psych: Alert, awake and oriented, normal mood CNS:  No cranial nerve deficits.  Power equal in all extremities.   Skin: Warm and dry.  No rashes noted.  The results of significant diagnostics from this hospitalization (including imaging, microbiology, ancillary and laboratory) are listed below for reference.     Diagnostic Studies:   US Abdomen Complete  Result Date: 07/10/2021 CLINICAL DATA:  Thrombocytopenia EXAM: ABDOMEN ULTRASOUND COMPLETE COMPARISON:  12/16/2020 FINDINGS: Gallbladder: Normally distended without stones or wall thickening. No pericholecystic fluid or sonographic Murphy sign. Common bile duct: Diameter: 2 mm, normal Liver: Echogenic parenchyma, likely fatty infiltration though this can be seen with cirrhosis and certain infiltrative disorders. No focal hepatic mass or nodularity. Portal vein is patent on color Doppler imaging with normal direction of blood flow towards the liver. IVC: Normal appearance Pancreas: Normal appearance Spleen: Normal appearance, 4.9 cm length Right Kidney: Length: 10.2 cm. Normal morphology without mass or hydronephrosis. Left Kidney: Length: 9.8 cm. Normal morphology without mass or hydronephrosis. Abdominal aorta: Low caliber Other findings: No free fluid IMPRESSION: Probable fatty infiltration of liver as above. Remainder of exam unremarkable. Electronically Signed   By: Lavonia Dana M.D.   On: 07/10/2021 16:12     Labs:   Basic Metabolic Panel: Recent Labs  Lab 07/09/21 1047 07/10/21 0029   NA 133* 139  K 4.3 3.6  CL 102 109  CO2 24 25  GLUCOSE 526* 178*  BUN 26* 21  CREATININE 1.31* 0.94  CALCIUM 8.5* 8.3*  MG  --  2.1  PHOS  --  2.8   GFR Estimated Creatinine Clearance: 77.9 mL/min (by C-G formula based on SCr of 0.94 mg/dL). Liver Function Tests: Recent Labs  Lab 07/09/21 1047  AST 19  ALT 24  ALKPHOS 74  BILITOT 0.8  PROT 6.0*  ALBUMIN 3.4*   No results for input(s): LIPASE, AMYLASE in the last 168 hours. No results for input(s): AMMONIA in the last 168 hours. Coagulation profile Recent Labs  Lab 07/09/21 1047  INR 1.1    CBC: Recent Labs  Lab 07/09/21 1047 07/09/21 1828 07/10/21 0029 07/10/21 1628 07/11/21 0449  WBC 7.0  --  6.3  --   --   NEUTROABS 4.1  --   --   --   --   HGB 11.4* 10.3* 10.1* 9.7* 9.3*  HCT 35.0* 31.3* 30.4* 30.0* 28.5*  MCV 89.3  --  89.1  --   --  PLT 110*  --  97*  --   --    Cardiac Enzymes: No results for input(s): CKTOTAL, CKMB, CKMBINDEX, TROPONINI in the last 168 hours. BNP: Invalid input(s): POCBNP CBG: Recent Labs  Lab 07/10/21 0741 07/10/21 1204 07/10/21 1706 07/10/21 2117 07/11/21 0740  GLUCAP 165* 81 243* 213* 92   D-Dimer No results for input(s): DDIMER in the last 72 hours. Hgb A1c Recent Labs    07/10/21 0029  HGBA1C 9.2*   Lipid Profile No results for input(s): CHOL, HDL, LDLCALC, TRIG, CHOLHDL, LDLDIRECT in the last 72 hours. Thyroid function studies No results for input(s): TSH, T4TOTAL, T3FREE, THYROIDAB in the last 72 hours.  Invalid input(s): FREET3 Anemia work up No results for input(s): VITAMINB12, FOLATE, FERRITIN, TIBC, IRON, RETICCTPCT in the last 72 hours. Microbiology Recent Results (from the past 240 hour(s))  Resp Panel by RT-PCR (Flu A&B, Covid) Nasopharyngeal Swab     Status: None   Collection Time: 07/09/21  5:15 PM   Specimen: Nasopharyngeal Swab; Nasopharyngeal(NP) swabs in vial transport medium  Result Value Ref Range Status   SARS Coronavirus 2 by RT PCR  NEGATIVE NEGATIVE Final    Comment: (NOTE) SARS-CoV-2 target nucleic acids are NOT DETECTED.  The SARS-CoV-2 RNA is generally detectable in upper respiratory specimens during the acute phase of infection. The lowest concentration of SARS-CoV-2 viral copies this assay can detect is 138 copies/mL. A negative result does not preclude SARS-Cov-2 infection and should not be used as the sole basis for treatment or other patient management decisions. A negative result may occur with  improper specimen collection/handling, submission of specimen other than nasopharyngeal swab, presence of viral mutation(s) within the areas targeted by this assay, and inadequate number of viral copies(<138 copies/mL). A negative result must be combined with clinical observations, patient history, and epidemiological information. The expected result is Negative.  Fact Sheet for Patients:  EntrepreneurPulse.com.au  Fact Sheet for Healthcare Providers:  IncredibleEmployment.be  This test is no t yet approved or cleared by the [place] FDA and  has been authorized for detection and/or diagnosis of SARS-CoV-2 by FDA under an Emergency Use Authorization (EUA). This EUA will remain  in effect (meaning this test can be used) for the duration of the COVID-19 declaration under Section 564(b)(1) of the Act, 21 U.S.C.section 360bbb-3(b)(1), unless the authorization is terminated  or revoked sooner.       Influenza A by PCR NEGATIVE NEGATIVE Final   Influenza B by PCR NEGATIVE NEGATIVE Final    Comment: (NOTE) The Xpert Xpress SARS-CoV-2/FLU/RSV plus assay is intended as an aid in the diagnosis of influenza from Nasopharyngeal swab specimens and should not be used as a sole basis for treatment. Nasal washings and aspirates are unacceptable for Xpert Xpress SARS-CoV-2/FLU/RSV testing.  Fact Sheet for Patients: EntrepreneurPulse.com.au  Fact Sheet for  Healthcare Providers: IncredibleEmployment.be  This test is not yet approved or cleared by the [place] FDA and has been authorized for detection and/or diagnosis of SARS-CoV-2 by FDA under an Emergency Use Authorization (EUA). This EUA will remain in effect (meaning this test can be used) for the duration of the COVID-19 declaration under Section 564(b)(1) of the Act, 21 U.S.C. section 360bbb-3(b)(1), unless the authorization is terminated or revoked.  Performed at [facility], [place] [address] [place], [place] [address]      Discharge Instructions:   Discharge Instructions     Diet - low sodium heart healthy   Complete by: As directed  Discharge instructions   Complete by: As directed    Follow up with your primary care provider in one week.  Check blood work at that time.  Follow up with GI as scheduled by the clinic. Seek medical attention for worsening symptoms including further bleeding.  Please consider quitting smoking and alcohol.   Increase activity slowly   Complete by: As directed       Allergies as of 07/11/2021   No Known Allergies      Medication List     STOP taking these medications    naproxen 375 MG tablet Commonly known as: NAPROSYN       TAKE these medications    Accu-Chek Softclix Lancets lancets Use as instructed   albuterol 108 (90 Base) MCG/ACT inhaler Commonly known as: VENTOLIN HFA Inhale 1-2 puffs into the lungs every 6 (six) hours as needed for wheezing or shortness of breath.   atorvastatin 20 MG tablet Commonly known as: LIPITOR TAKE 1 TABLET BY MOUTH EVERY DAY   erythromycin ophthalmic ointment Place 1 application into the right eye daily.   glucose blood test strip Use as instructed   Lantus SoloStar 100 UNIT/ML Solostar Pen Generic drug: insulin glargine Inject 55 Units into the skin daily.   metFORMIN 750 MG 24 hr tablet Commonly known as: GLUCOPHAGE-XR Take 1 tablet  (750 mg total) by mouth 2 (two) times daily. What changed: when to take this   Olmesartan-amLODIPine-HCTZ 40-5-25 MG Tabs Commonly known as: Tribenzor Take 1 tablet by mouth daily.   pantoprazole 40 MG tablet Commonly known as: Protonix Take 1 tablet (40 mg total) by mouth daily.   Pen Needles 31G X 8 MM Misc Use this for lantus solorstar pen   pioglitazone 30 MG tablet Commonly known as: ACTOS TAKE 1 TABLET BY MOUTH EVERY DAY   tadalafil 20 MG tablet Commonly known as: Cialis Take 1 tablet (20 mg total) by mouth daily as needed for erectile dysfunction.        Follow-up Information     Gatha Mayer, MD Follow up on 07/14/2021.   Specialty: Gastroenterology Why: Go to lab in basement and get blood count checked 730-5 No need to fast Contact information: [address] [address] [phone]         Denita Lung, MD Follow up in 1 week(s).   Specialty: Family Medicine Contact information: [place] [address] [phone]                  Time coordinating discharge: 39 minutes  Signed:  Sharlyne Koeneman  [facility] 07/11/2021, 9:19 AM

## 2021-07-13 ENCOUNTER — Telehealth: Payer: Self-pay

## 2021-07-13 NOTE — Telephone Encounter (Signed)
Transition Care Management Follow-up Telephone Call Date of discharge and from where: Micheal Leblanc 07/11/21 How have you been since you were released from the hospital? [place] Any questions or concerns? No  Items Reviewed: Did the pt receive and understand the discharge instructions provided? Yes  Medications obtained and verified? Yes  Other? Yes  Any new allergies since your discharge? No  Dietary orders reviewed? No Do you have support at home? Yes   Home Care and Equipment/Supplies: Were home health services ordered? no Functional Questionnaire: (I = Independent and D = Dependent) ADLs: I  Bathing/Dressing- I  Meal Prep- I  Eating- I  Maintaining continence- I  Transferring/Ambulation- I  Managing Meds- I  Follow up appointments reviewed:  PCP Hospital f/u appt confirmed? Yes  Scheduled to see Dr. Redmond School on 07/15/21 @ 2:00. [facility] f/u appt confirmed? Yes  Scheduled to see GI on 07/14/21 @ 4:00. Are transportation arrangements needed? No  If their condition worsens, is the pt aware to call PCP or go to the Emergency Dept.? Yes Was the patient provided with contact information for the PCP's office or ED? Yes Was to pt encouraged to call back with questions or concerns? Yes

## 2021-07-14 ENCOUNTER — Other Ambulatory Visit (INDEPENDENT_AMBULATORY_CARE_PROVIDER_SITE_OTHER): Payer: Medicare Other

## 2021-07-14 DIAGNOSIS — D649 Anemia, unspecified: Secondary | ICD-10-CM

## 2021-07-14 LAB — CBC
HCT: 33 % — ABNORMAL LOW (ref 39.0–52.0)
Hemoglobin: 11 g/dL — ABNORMAL LOW (ref 13.0–17.0)
MCHC: 33.3 g/dL (ref 30.0–36.0)
MCV: 88.4 fl (ref 78.0–100.0)
Platelets: 136 10*3/uL — ABNORMAL LOW (ref 150.0–400.0)
RBC: 3.74 Mil/uL — ABNORMAL LOW (ref 4.22–5.81)
RDW: 14.5 % (ref 11.5–15.5)
WBC: 6.8 10*3/uL (ref 4.0–10.5)

## 2021-07-14 LAB — FERRITIN: Ferritin: 278.8 ng/mL (ref 22.0–322.0)

## 2021-07-14 LAB — VITAMIN B12: Vitamin B-12: 449 pg/mL (ref 211–911)

## 2021-07-15 ENCOUNTER — Encounter: Payer: Self-pay | Admitting: Family Medicine

## 2021-07-15 ENCOUNTER — Ambulatory Visit (INDEPENDENT_AMBULATORY_CARE_PROVIDER_SITE_OTHER): Payer: Medicare Other | Admitting: Family Medicine

## 2021-07-15 ENCOUNTER — Other Ambulatory Visit: Payer: Self-pay

## 2021-07-15 VITALS — BP 122/70 | HR 77 | Temp 97.3°F | Wt 175.4 lb

## 2021-07-15 DIAGNOSIS — K922 Gastrointestinal hemorrhage, unspecified: Secondary | ICD-10-CM | POA: Diagnosis not present

## 2021-07-15 DIAGNOSIS — Z23 Encounter for immunization: Secondary | ICD-10-CM

## 2021-07-15 NOTE — Progress Notes (Signed)
° °  Subjective:    Patient ID: Micheal Leblanc, male    DOB: November 30, 1950, 71 y.o.   MRN: [id number]  HPI He is here for posthospitalization follow-up.  He did have evidence of a GI bleed however it is questionable as to where exactly the bleed was.  He complains of dark stool but apparently is also some red blood in stool as well.  He is having no abdominal pain nausea or vomiting.  Presently he is doing much better.  He did have blood work done yesterday.  Review of his previous blood work did show a low calcium level.   Review of Systems     Objective:   Physical Exam Alert and in no distress.  Abdominal exam shows no palpable tenderness. Recent hemoglobin was 11.0.      Assessment & Plan:  Need for influenza vaccination - Plan: Flu Vaccine QUAD High Dose(Fluad), Flu vaccine HIGH DOSE PF (Fluzone High dose)  Gastrointestinal hemorrhage, unspecified gastrointestinal hemorrhage type - Plan: Comprehensive metabolic panel, Magnesium, Phosphorus  Hypocalcemia - Plan: Comprehensive metabolic panel, Magnesium, Phosphorus He is scheduled to follow-up with gastroenterology on Friday. Also encouraged him to get the Shingrix.

## 2021-07-16 LAB — COMPREHENSIVE METABOLIC PANEL
ALT: 20 IU/L (ref 0–44)
AST: 24 IU/L (ref 0–40)
Albumin/Globulin Ratio: 1.9 (ref 1.2–2.2)
Albumin: 4 g/dL (ref 3.8–4.8)
Alkaline Phosphatase: 90 IU/L (ref 44–121)
BUN/Creatinine Ratio: 14 (ref 10–24)
BUN: 14 mg/dL (ref 8–27)
Bilirubin Total: 0.2 mg/dL (ref 0.0–1.2)
CO2: 24 mmol/L (ref 20–29)
Calcium: 9 mg/dL (ref 8.6–10.2)
Chloride: 104 mmol/L (ref 96–106)
Creatinine, Ser: 1.02 mg/dL (ref 0.76–1.27)
Globulin, Total: 2.1 g/dL (ref 1.5–4.5)
Glucose: 140 mg/dL — ABNORMAL HIGH (ref 70–99)
Potassium: 4 mmol/L (ref 3.5–5.2)
Sodium: 141 mmol/L (ref 134–144)
Total Protein: 6.1 g/dL (ref 6.0–8.5)
eGFR: 79 mL/min/{1.73_m2} (ref >59)

## 2021-07-16 LAB — PHOSPHORUS: Phosphorus: 2.9 mg/dL (ref 2.8–4.1)

## 2021-07-16 LAB — MAGNESIUM: Magnesium: 1.8 mg/dL (ref 1.6–2.3)

## 2021-07-17 ENCOUNTER — Encounter: Payer: Self-pay | Admitting: Gastroenterology

## 2021-07-17 ENCOUNTER — Ambulatory Visit: Payer: Medicare Other | Admitting: Gastroenterology

## 2021-07-17 VITALS — BP 110/72 | HR 75 | Ht 70.0 in | Wt 173.0 lb

## 2021-07-17 DIAGNOSIS — Z86010 Personal history of colonic polyps: Secondary | ICD-10-CM

## 2021-07-17 DIAGNOSIS — K625 Hemorrhage of anus and rectum: Secondary | ICD-10-CM

## 2021-07-17 NOTE — Progress Notes (Signed)
Review of pertinent gastrointestinal problems: 1.  History of precancerous polyps.  Colonoscopy 2007 TVA removed.  Colonoscopy December 2015 3 subcentimeter adenomas removed.  Colonoscopy April 2019 diverticulosis was noted, 2 subcentimeter p adenomas were removed. 2.  Painless red rectal bleeding, presumed diverticular January 2023 tonight admission to the hospital   HPI: This is a very pleasant 71 year old man whom I last saw a little over 3 years ago   He was recently hospitalized a week or so ago with painless red rectal bleeding.  He had a 6 g drop in his hemoglobin.  He did not require a blood transfusion.  Bleeding stopped quickly it seems like.  Follow-up labs 2 to 3 days ago showed his hemoglobin was 11.0.  This morning he described his bleeding has pouring out of liquid red blood.  Never lightheaded or syncope.  Completely painless.  His bowels are completely back to normal.  His hemoglobin recheck 3 days ago was improved to 11.   ROS: complete GI ROS as described in HPI, all other review negative.  Constitutional:  No unintentional weight loss   Past Medical History:  Diagnosis Date   Allergy    Diabetes mellitus    ED (erectile dysfunction)    Hx of colonic polyps    Hyperlipidemia    Hypertension     Past Surgical History:  Procedure Laterality Date   COLONOSCOPY     POLYPECTOMY      Current Outpatient Medications  Medication Sig Dispense Refill   Accu-Chek Softclix Lancets lancets Use as instructed 200 each 12   atorvastatin (LIPITOR) 20 MG tablet TAKE 1 TABLET BY MOUTH EVERY DAY (Patient taking differently: Take 20 mg by mouth daily.) 90 tablet 1   glucose blood test strip Use as instructed 200 each 12   insulin glargine (LANTUS SOLOSTAR) 100 UNIT/ML Solostar Pen Inject 55 Units into the skin daily. 20.9 mL 3   Insulin Pen Needle (PEN NEEDLES) 31G X 8 MM MISC Use this for lantus solorstar pen 100 each 1   metFORMIN (GLUCOPHAGE-XR) 750 MG 24 hr tablet Take 1  tablet (750 mg total) by mouth 2 (two) times daily. (Patient taking differently: Take 750 mg by mouth 2 (two) times daily.) 180 tablet 0   Olmesartan-amLODIPine-HCTZ (TRIBENZOR) 40-5-25 MG TABS Take 1 tablet by mouth daily. 90 tablet 3   pantoprazole (PROTONIX) 40 MG tablet Take 1 tablet (40 mg total) by mouth daily. 30 tablet 0   pioglitazone (ACTOS) 30 MG tablet TAKE 1 TABLET BY MOUTH EVERY DAY (Patient taking differently: Take 30 mg by mouth daily.) 90 tablet 1   No current facility-administered medications for this visit.    Allergies as of 07/17/2021   (No Known Allergies)    Family History  Problem Relation Age of Onset   Arthritis Mother    Colon cancer Neg Hx    Rectal cancer Neg Hx    Stomach cancer Neg Hx    Colon polyps Neg Hx    Diabetes Neg Hx     Social History   Socioeconomic History   Marital status: Single    Spouse name: Not on file   Number of children: Not on file   Years of education: Not on file   Highest education level: Not on file  Occupational History   Not on file  Tobacco Use   Smoking status: Former    Packs/day: 0.50    Types: Cigarettes   Smokeless tobacco: Never  Substance and Sexual Activity  Alcohol use: Yes    Alcohol/week: 4.0 standard drinks    Types: 2 Cans of beer, 2 Shots of liquor per week    Comment: rare   Drug use: No   Sexual activity: Not Currently  Other Topics Concern   Not on file  Social History Narrative   Not on file   Social Determinants of Health   Financial Resource Strain: Not on file  Food Insecurity: Not on file  Transportation Needs: Not on file  Physical Activity: Not on file  Stress: Not on file  Social Connections: Not on file  Intimate Partner Violence: Not on file     Physical Exam: BP 110/72    Pulse 75    Ht 5\' 10"  (1.778 m)    Wt 173 lb (78.5 kg)    SpO2 98%    BMI 24.82 kg/m  Constitutional: generally well-appearing Psychiatric: alert and oriented x3 Abdomen: soft, nontender,  nondistended, no obvious ascites, no peritoneal signs, normal bowel sounds No peripheral edema noted in lower extremities  Assessment and plan: 71 y.o. male with painless red rectal bleeding, very likely diverticular  I think it is almost certain that he had lower GI bleeding, presumably diverticular.  The bleeding is stopped.  He knows there is probably nothing he can do to prevent bleeding again.  He is not on blood thinners.  He has read about avoiding seeds and nuts and berries I told him that that probably will not make any difference.  We agreed to observe him clinically, if he has repeat bleeding he knows to go to the emergency room for further evaluation at that point he might need colonoscopy plus minus upper endoscopy depending on how he clinically presents.  I do not think he needs repeat colonoscopy now, he has had only some small polyps in the past and is not "due" for surveillance for another year.  Please see the "Patient Instructions" section for addition details about the plan.  Micheal Loffler, MD [facility] 07/17/2021, 9:01 AM   Total time on date of encounter was 25 minutes (this included time spent preparing to see the patient reviewing records; obtaining and/or reviewing separately obtained history; performing a medically appropriate exam and/or evaluation; counseling and educating the patient and family if present; ordering medications, tests or procedures if applicable; and documenting clinical information in the health record).

## 2021-07-17 NOTE — Patient Instructions (Signed)
If you are age 71 or older, your body mass index should be between 23-30. Your Body mass index is 24.82 kg/m. If this is out of the aforementioned range listed, please consider follow up with your Primary Care Provider. ________________________________________________________  The [facility] providers would like to encourage you to use [facility] to communicate with providers for non-urgent requests or questions.  Due to long hold times on the telephone, sending your provider a message by [facility] may be a faster and more efficient way to get a response.  Please allow 48 business hours for a response.  Please remember that this is for non-urgent requests.  _______________________________________________________  Please call our office or send a MyChart message if you have any further issues with rectal bleeding.   You will be due for colonoscopy in 2024.  Someone from our office will contact you about getting this scheduled.  Thank you for entrusting me with your care and choosing [facility].  Dr Ardis Hughs

## 2021-07-28 IMAGING — US US ABDOMINAL AORTA SCREENING AAA
1 series · 14 of 15 positions shown · non-contrast
Comparison: None.

CLINICAL DATA: Male between 65-75 years of age with a smoking
history.

EXAM:
US ABDOMINAL AORTA MEDICARE SCREENING
TECHNIQUE: Ultrasound examination of the abdominal aorta was performed as a
screening evaluation for abdominal aortic aneurysm.

[Series 1: us abdominal aorta screening aaa · 0.21mm/px · 14 of 15 slices shown]
[im 1/15]
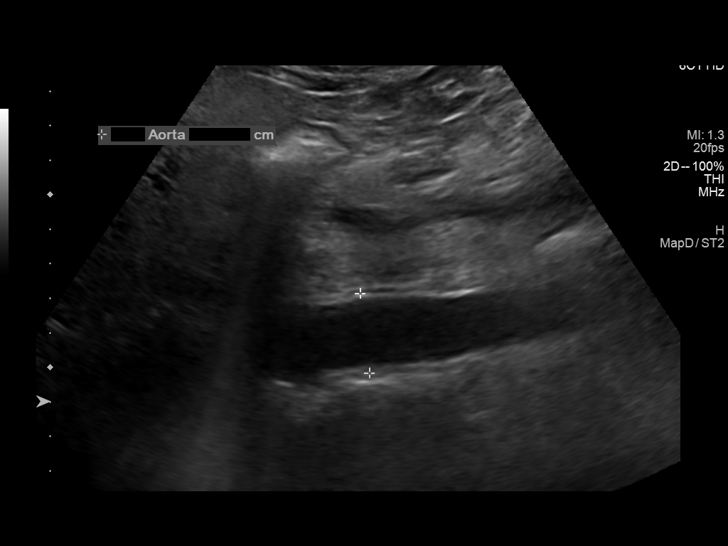
[im 2/15]
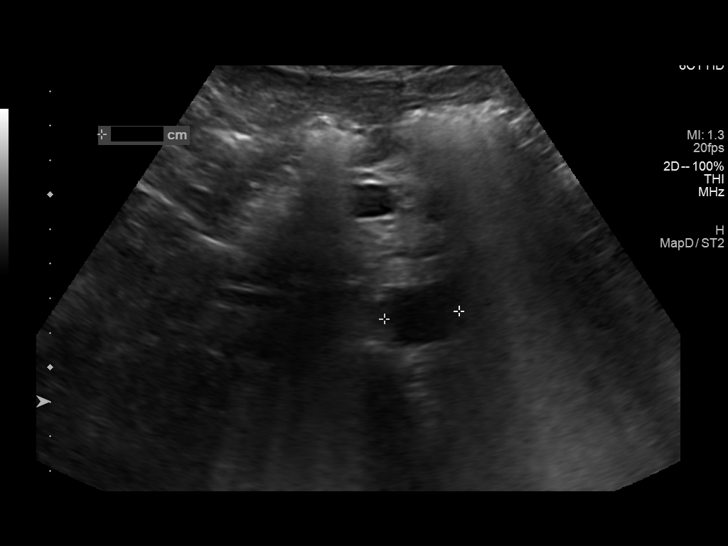
[im 3/15]
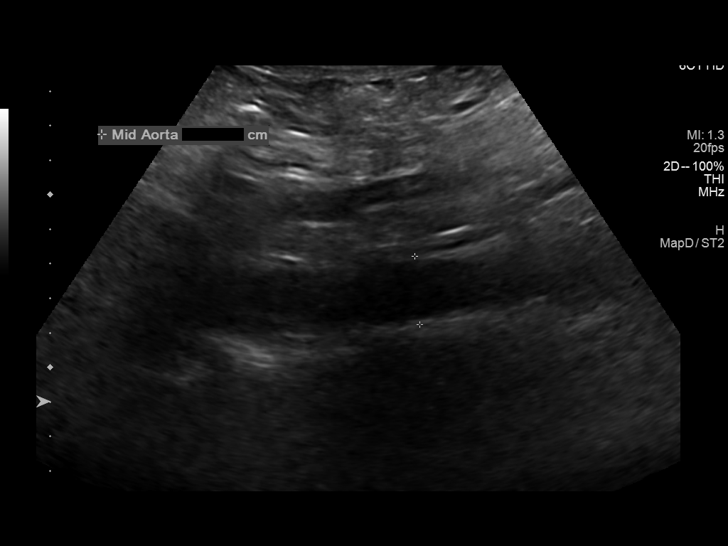
[im 4/15]
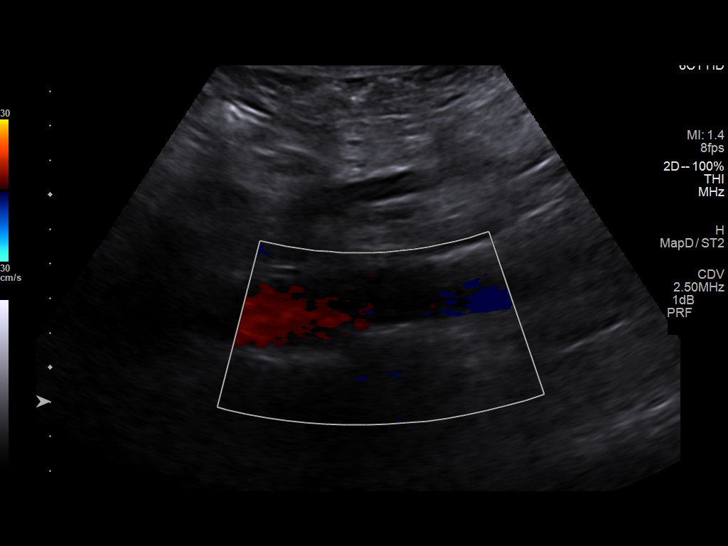
[im 5/15]
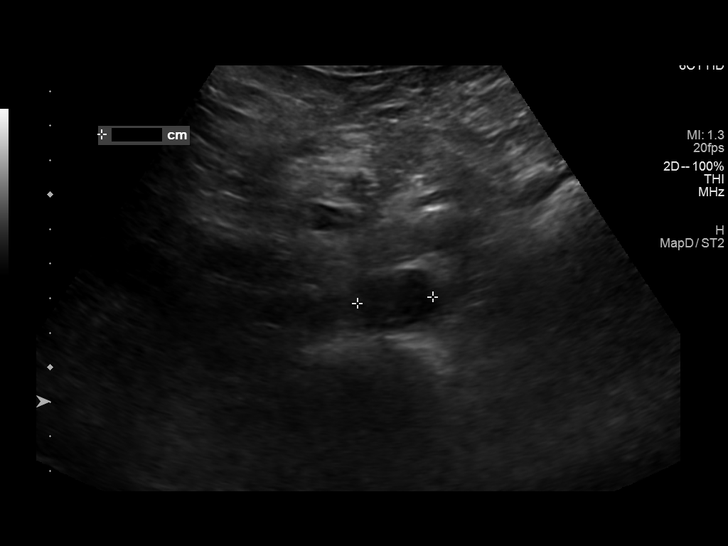
[im 6/15]
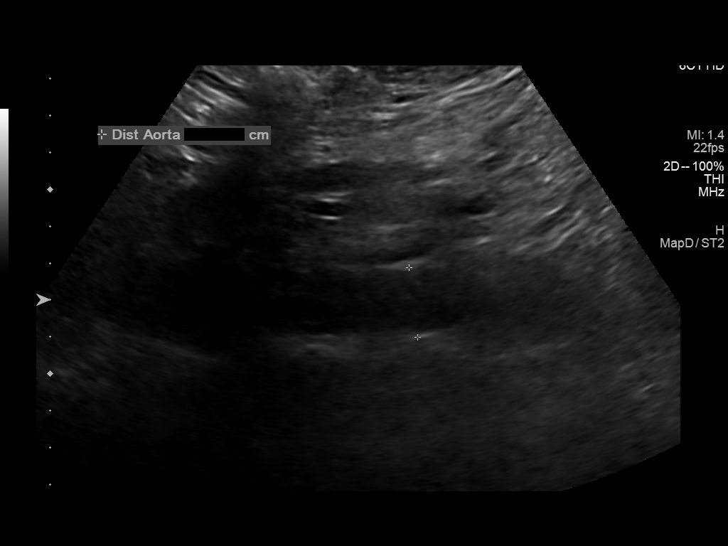
[im 7/15]
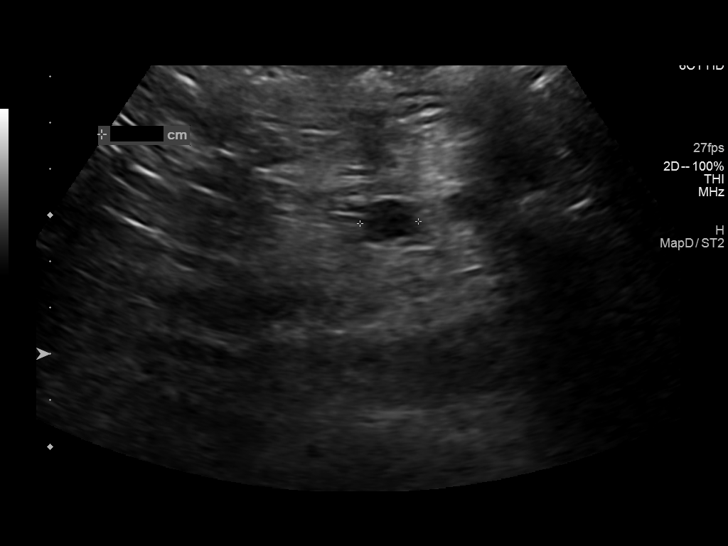
[im 9/15]
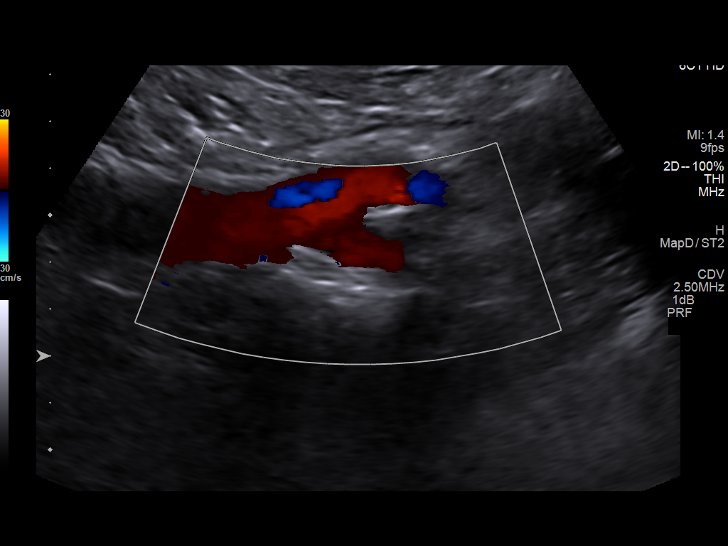
[im 10/15]
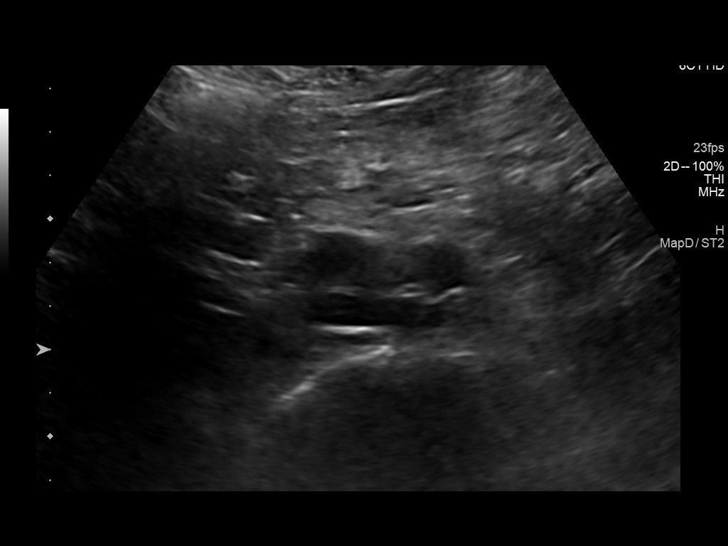
[im 11/15]
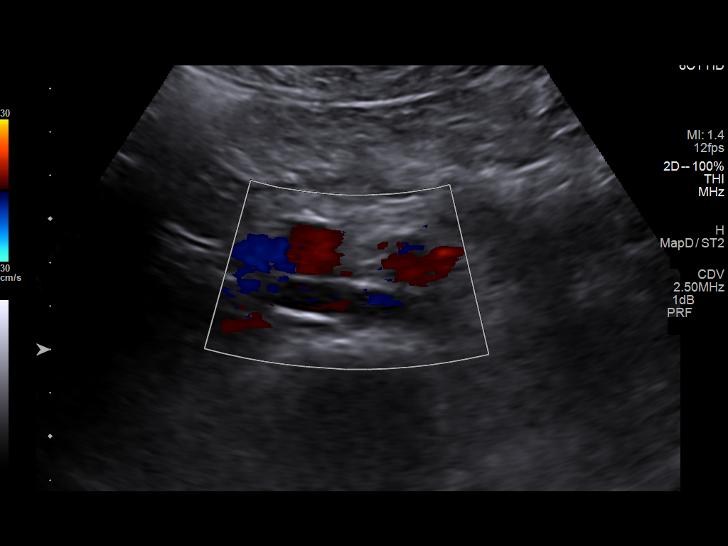
[im 12/15]
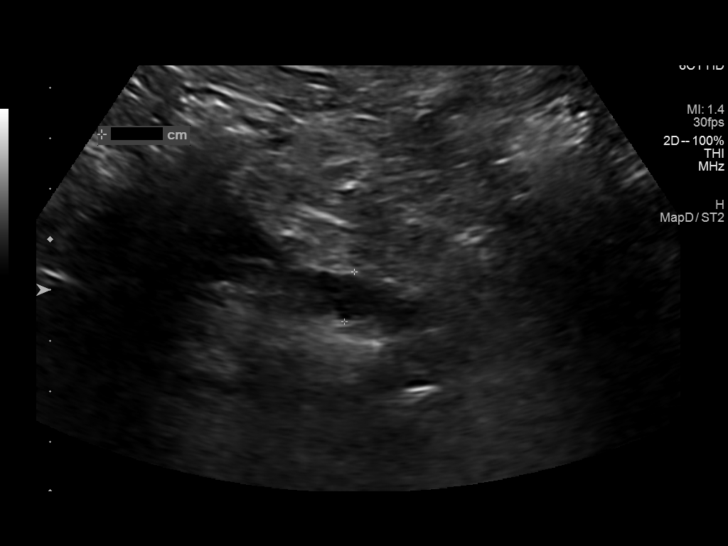
[im 13/15]
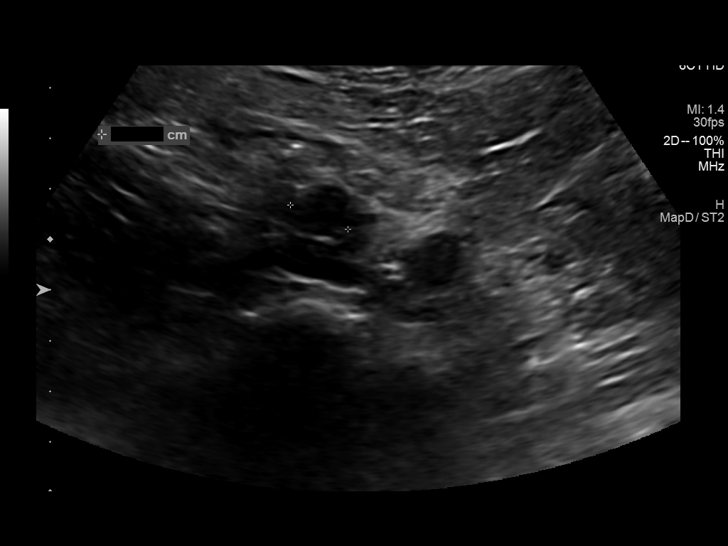
[im 14/15]
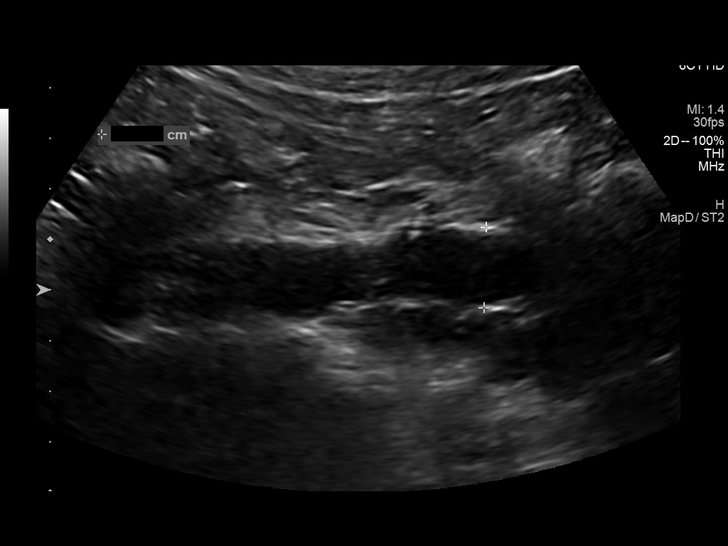
[im 15/15]
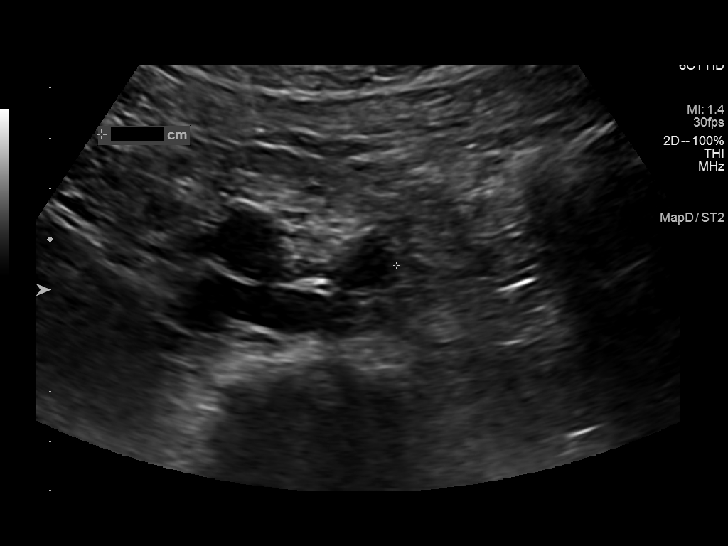

[14 of 15 positions shown; findings below may reference images not displayed]

FINDINGS: Abdominal aortic measurements as follows:

Proximal:  2.3 x 2.2 cm

Mid:  2.0 x 2.2 cm

Distal:  1.9 x 1.3 cm

There is mild ectasia of the left iliac artery measuring 1.6 cm in
diameter. The right iliac artery measures 1.2 cm in diameter.
IMPRESSION: Negative for abdominal aortic aneurysm.

## 2021-08-02 NOTE — Progress Notes (Signed)
°  Subjective:    Patient ID: Micheal Leblanc, male    DOB: 08-17-1950, 71 y.o.   MRN: [id number]  SAABIR Leblanc is a 71 y.o. male who presents for follow-up of Type 2 diabetes mellitus.  Home blood sugar records:  post meal lowest reading 148 highest 220 Current symptoms/problems include none and have been stable. Daily foot checks:yes   Any foot concerns: foot pain and cramping in both legs at [facility] Since last being seen, he stopped smoking approximately 4 months ago.  He is also cut back on his alcohol consumption now only drinking on weekends and usually having 3 or 4 in an evening and at sit.  He has made some dietary changes and scheduling changes as to when he eats.  He has been checking his blood sugars in the morning and states they are below 120 and also has been checking them 2 hours after some of his meals and getting them in good numbers.  He is taking 55 units of insulin, metformin, Actos.  Continues on atorvastatin and having no difficulty with that.  He continues on [employer]. Exercise: The patient has a physically strenuous job, but has no regular exercise apart from work.  Diet: improving  The following portions of the patient's history were reviewed and updated as appropriate: allergies, current medications, past medical history, past social history and problem list.  ROS as in subjective above.     Objective:    Physical Exam Alert and in no distress otherwise not examined. A1C is 7.7  Lab Review Diabetic Labs Latest Ref Rng & Units 07/15/2021 07/10/2021 07/09/2021 03/30/2021 12/08/2020  HbA1c 4.8 - 5.6 % - 9.2(H) - 9.1(A) 11.1(A)  Microalbumin mg/L - - - - >300.0  Micro/Creat Ratio - - - - - >137.3  Chol 100 - 199 mg/dL - - - - 130  HDL >39 mg/dL - - - - 45  Calc LDL 0 - 99 mg/dL - - - - 64  Triglycerides 0 - 149 mg/dL - - - - 118  Creatinine 0.76 - 1.27 mg/dL 1.02 0.94 1.31(H) - 1.13   BP/Weight 07/17/2021 07/15/2021 07/11/2021 07/09/2021 54/07/7060  Systolic BP 376 283  151 - 761  Diastolic BP 72 70 76 - 74  Wt. (Lbs) 173 175.4 171.96 - 171.6  BMI 24.82 24.46 - 23.98 23.93   Foot/eye exam completion dates Latest Ref Rng & Units 12/08/2020 02/12/2016  Eye Exam No Retinopathy - No Retinopathy  Foot Form Completion - Done -    Micheal Leblanc  reports that he has quit smoking. His smoking use included cigarettes. He smoked an average of .5 packs per day. He has never used smokeless tobacco. He reports current alcohol use of about 4.0 standard drinks per week. He reports that he does not use drugs.     Assessment & Plan:    Hyperlipidemia associated with type 2 diabetes mellitus ([place])  Hypertension complicating diabetes ([place])  Type 2 diabetes mellitus with hyperglycemia, without long-term current use of insulin ([place])  Former smoker  Rx changes: none Education: Reviewed ABCs of diabetes management (respective goals in parentheses):  A1C (<7), blood pressure (<130/80), and cholesterol (LDL <100). Compliance at present is estimated to be excellent. Efforts to improve compliance (if necessary) will be directed at  no change . Follow up: 4 months I congratulated him on the good work that he has done and told him to keep it up

## 2021-08-03 ENCOUNTER — Ambulatory Visit (INDEPENDENT_AMBULATORY_CARE_PROVIDER_SITE_OTHER): Payer: Medicare Other | Admitting: Family Medicine

## 2021-08-03 ENCOUNTER — Other Ambulatory Visit: Payer: Self-pay

## 2021-08-03 ENCOUNTER — Encounter: Payer: Self-pay | Admitting: Family Medicine

## 2021-08-03 VITALS — BP 130/78 | HR 68 | Temp 98.0°F | Wt 174.4 lb

## 2021-08-03 DIAGNOSIS — Z87891 Personal history of nicotine dependence: Secondary | ICD-10-CM

## 2021-08-03 DIAGNOSIS — E785 Hyperlipidemia, unspecified: Secondary | ICD-10-CM | POA: Diagnosis not present

## 2021-08-03 DIAGNOSIS — I152 Hypertension secondary to endocrine disorders: Secondary | ICD-10-CM

## 2021-08-03 DIAGNOSIS — E1169 Type 2 diabetes mellitus with other specified complication: Secondary | ICD-10-CM | POA: Diagnosis not present

## 2021-08-03 DIAGNOSIS — E1165 Type 2 diabetes mellitus with hyperglycemia: Secondary | ICD-10-CM | POA: Diagnosis not present

## 2021-08-03 DIAGNOSIS — E1159 Type 2 diabetes mellitus with other circulatory complications: Secondary | ICD-10-CM

## 2021-08-03 LAB — POCT GLYCOSYLATED HEMOGLOBIN (HGB A1C): Hemoglobin A1C: 7.7 % — AB (ref 4.0–5.6)

## 2021-09-02 ENCOUNTER — Telehealth (INDEPENDENT_AMBULATORY_CARE_PROVIDER_SITE_OTHER): Payer: Medicare Other | Admitting: Family Medicine

## 2021-09-02 ENCOUNTER — Other Ambulatory Visit (INDEPENDENT_AMBULATORY_CARE_PROVIDER_SITE_OTHER): Payer: Medicare Other

## 2021-09-02 ENCOUNTER — Encounter: Payer: Self-pay | Admitting: Family Medicine

## 2021-09-02 VITALS — Temp 97.1°F | Ht 70.0 in | Wt 174.0 lb

## 2021-09-02 DIAGNOSIS — R197 Diarrhea, unspecified: Secondary | ICD-10-CM

## 2021-09-02 DIAGNOSIS — R112 Nausea with vomiting, unspecified: Secondary | ICD-10-CM

## 2021-09-02 DIAGNOSIS — U071 COVID-19: Secondary | ICD-10-CM | POA: Diagnosis not present

## 2021-09-02 LAB — POC COVID19 BINAXNOW: SARS Coronavirus 2 Ag: POSITIVE — AB

## 2021-09-02 NOTE — Progress Notes (Signed)
Start time: 1:31 ?End time: 1:43.  Additional 8 minute phone call to discuss results. ? ?Virtual Visit via Video Note ? ?I connected with Micheal Leblanc on 09/02/21 by a video enabled telemedicine application and verified that I am speaking with the correct person using two identifiers. ? ?Location: ?Patient: home ?Provider: office ?  ?I discussed the limitations of evaluation and management by telemedicine and the availability of in person appointments. The patient expressed understanding and agreed to proceed. ? ?History of Present Illness: ? ?Chief Complaint  ?Patient presents with  ? Nausea  ?  VIRTUAL has had nausea, no energy and cannot eat anything. Started [employer]. He is feeling somewhat better today. He did not go to work M,T or today and needs a note to go back to work.   ? ?"I've been sick" ?He had diarrhea on Sunday, Monday, and some on Tuesday. He vomited on Sunday, small amount on Monday. ?He took some Pepto Bismol, drank ginger ale.  Bowel movement was normal yesterday, hasn't had one today.  Nausea is improving, much better. ?Denies abdominal pain, melena, hematochezia. ? ?He feels much better today. Has been in bed all day, but ready to get up and about. ?He is requesting a work note for his absence (missed 3 days) ?He states that he has had no energy, couldn't work since Saturday or Sunday. ? ?COVID vaccines x 3 ?No sick contacts. ? ?Works Forensic psychologist. ? ?PMH, PSH, SH reviewed ? ?DM, HTN, HLD ? ?Outpatient Encounter Medications as of 09/02/2021  ?Medication Sig Note  ? Accu-Chek Softclix Lancets lancets Use as instructed   ? atorvastatin (LIPITOR) 20 MG tablet TAKE 1 TABLET BY MOUTH EVERY DAY (Patient taking differently: Take 20 mg by mouth daily.)   ? glucose blood test strip Use as instructed   ? insulin glargine (LANTUS SOLOSTAR) 100 UNIT/ML Solostar Pen Inject 55 Units into the skin daily.   ? Insulin Pen Needle (PEN NEEDLES) 31G X 8 MM MISC Use this for lantus solorstar pen    ? metFORMIN (GLUCOPHAGE-XR) 750 MG 24 hr tablet Take 1 tablet (750 mg total) by mouth 2 (two) times daily. (Patient taking differently: Take 750 mg by mouth 2 (two) times daily.) 07/09/2021: Pharmacy record says 750 mg 2 times daily  ? Olmesartan-amLODIPine-HCTZ (TRIBENZOR) 40-5-25 MG TABS Take 1 tablet by mouth daily.   ? pantoprazole (PROTONIX) 40 MG tablet Take 1 tablet (40 mg total) by mouth daily.   ? pioglitazone (ACTOS) 30 MG tablet TAKE 1 TABLET BY MOUTH EVERY DAY (Patient taking differently: Take 30 mg by mouth daily.)   ? ?No facility-administered encounter medications on file as of 09/02/2021.  ? ?No Known Allergies ? ?ROS:  GI symptoms per HPI, resolved, only minimal nausea today. ?No fever, slight chill occasionally. ?Denies headache, dizziness. ?He had a little cough.  Denies sore throat or runny nose. ?No chest pain or shortness of breath. ?No rashes, swelling. ? ?  ?Observations/Objective: ? ?Temp (!) 97.1 ?F (36.2 ?C) (Oral)   Ht '5\' 10"'$  (1.778 m)   Wt 174 lb (78.9 kg)   BMI 24.97 kg/m?  ? ?Well-appearing, pleasant male, noted to be holding a cigarette when he came to the video. ?He is alert and oriented. ?Cranial nerves are grossly intact. ?Normal speech. ?Exam is limited due to the virtual nature of the visit. ? ?COVID rapid test + ? ? ?Assessment and Plan: ? ?COVID-19 virus infection - reviewed isolation/treatment recs. Currently asymp, declines antiviral (c/b tomorrow if  worse). ? ?Diarrhea, unspecified type - along with nausea, vomiting.  Suspect related to [place].  Sx have resolved. ? ? ?Sunday--day 0 ?Days 1-5 Mon-Fri ?Can return to work Saturday, wearing a mask. He states Saturday is an optional overtime day, and he doesn't want to work then.  Note to RTW Monday ?Saturday through Wednesday ewar mask all the time ? ?Discussed anti-viral, not needed since better.  Will call tomorrow if worse. ? ? ? ?Please QUIT SMOKING!   ? ?I'm glad that your stomach symptoms (vomiting and diarrhea) have  resolved, but the cause of this illness appears to be COVID.  You need to remain in isolation through Friday of this week. ?Technically, on Saturday (day 6), if you don't have fever and aren't coughing, you may leave your house, but need to wear a mask all of the time (no eating with others, or being around others without your mask on).  You need to stay wearing your mask through Wednesday. ?You may return to work on Monday, wearing your mask. ?A work note will be sent you via email. ? ?We briefly discussed antiviral medications to treat COVID.  Since your symptoms resolved, we likely do not need to use these.  If you develop any worsening symptoms tomorrow, let us know and we can prescribe the medication. ? ? ?Follow Up Instructions: ? ?  ?I discussed the assessment and treatment plan with the patient. The patient was provided an opportunity to ask questions and all were answered. The patient agreed with the plan and demonstrated an understanding of the instructions. ?  ?The patient was advised to call back or seek an in-person evaluation if the symptoms worsen or if the condition fails to improve as anticipated. ? ?I spent 25 minutes dedicated to the care of this patient, including pre-visit review of records, face to face time, post-visit ordering of testing and documentation. ? ? ? ?Vikki Ports, MD ?

## 2021-09-02 NOTE — Patient Instructions (Signed)
Please QUIT SMOKING!   ? ?I'm glad that your stomach symptoms (vomiting and diarrhea) have resolved, but the cause of this illness appears to be COVID.  You need to remain in isolation through Friday of this week. ?Technically, on Saturday (day 6), if you don't have fever and aren't coughing, you may leave your house, but need to wear a mask all of the time (no eating with others, or being around others without your mask on).  You need to stay wearing your mask through Wednesday. ?You may return to work on Monday, wearing your mask. ?A work note will be sent you via email. ? ?We briefly discussed antiviral medications to treat COVID.  Since your symptoms resolved, we likely do not need to use these.  If you develop any worsening symptoms tomorrow, let us know and we can prescribe the medication. ? ?

## 2021-09-03 ENCOUNTER — Encounter: Payer: Self-pay | Admitting: Family Medicine

## 2021-09-07 ENCOUNTER — Telehealth: Payer: Self-pay | Admitting: Family Medicine

## 2021-09-07 DIAGNOSIS — Z20822 Contact with and (suspected) exposure to covid-19: Secondary | ICD-10-CM | POA: Diagnosis not present

## 2021-09-07 DIAGNOSIS — Z03818 Encounter for observation for suspected exposure to other biological agents ruled out: Secondary | ICD-10-CM | POA: Diagnosis not present

## 2021-09-07 NOTE — Telephone Encounter (Signed)
Pt called, and states that he is congested coughing, fatigue, and has been taking mucinex dm, he wants to know if he can be retested for covid, and if he is cleared to go back to work, his job also thinks that it is a good idea to get rested since he is still having symptoms,   ?Pt can be reached at [phone]  ?

## 2021-09-07 NOTE — Telephone Encounter (Signed)
Patient went to [facility] and had test done and was negative so he said he is all good and thanks. No note needed. ?

## 2021-09-07 NOTE — Telephone Encounter (Signed)
If respiratory symptoms aren't significantly improved, then guidelines for COVID state to stay out of work (only return, while weraing mask if afebrile and respiratory symptoms are significantly improved). Likely will need new work note; can be out full 10days  (look back at visit). ?No reason for re-testing. ?

## 2021-11-26 ENCOUNTER — Ambulatory Visit (INDEPENDENT_AMBULATORY_CARE_PROVIDER_SITE_OTHER): Payer: Medicare Other | Admitting: Family Medicine

## 2021-11-26 VITALS — BP 110/70 | HR 81 | Temp 97.9°F | Wt 164.6 lb

## 2021-11-26 DIAGNOSIS — J301 Allergic rhinitis due to pollen: Secondary | ICD-10-CM | POA: Diagnosis not present

## 2021-11-26 DIAGNOSIS — E1159 Type 2 diabetes mellitus with other circulatory complications: Secondary | ICD-10-CM

## 2021-11-26 DIAGNOSIS — E1169 Type 2 diabetes mellitus with other specified complication: Secondary | ICD-10-CM

## 2021-11-26 DIAGNOSIS — K635 Polyp of colon: Secondary | ICD-10-CM | POA: Diagnosis not present

## 2021-11-26 DIAGNOSIS — E785 Hyperlipidemia, unspecified: Secondary | ICD-10-CM

## 2021-11-26 DIAGNOSIS — Z794 Long term (current) use of insulin: Secondary | ICD-10-CM

## 2021-11-26 DIAGNOSIS — I152 Hypertension secondary to endocrine disorders: Secondary | ICD-10-CM

## 2021-11-26 DIAGNOSIS — E291 Testicular hypofunction: Secondary | ICD-10-CM | POA: Diagnosis not present

## 2021-11-26 DIAGNOSIS — E118 Type 2 diabetes mellitus with unspecified complications: Secondary | ICD-10-CM | POA: Diagnosis not present

## 2021-11-26 NOTE — Patient Instructions (Signed)
Track of your blood sugars and increase your Lantus by 2 units every 2 days until your morning blood sugar is below 120

## 2021-11-26 NOTE — Progress Notes (Unsigned)
Subjective:    Patient ID: Micheal Leblanc, male    DOB: 1950/08/20, 71 y.o.   MRN: [id number]  Micheal Leblanc is a 71 y.o. male who presents for follow-up of Type 2 diabetes mellitus.  Patient are checking home blood sugars.   Home blood sugar records: BGs range between 85 and 225 How often is blood sugars being checked: QOD fasting and post meal Current symptoms/problems include weight loss and have been stable. Daily foot checks: yes   Any foot concerns: none at this time Last eye exam: 08/2021 Exercise:  staying active  He is taking Actos, metformin and Lantus insulin at around 50 units.  He does check his blood sugars fairly regularly.  He does have reflux and is taking Protonix fairly regularly.  Atorvastatin is causing no difficulty.  He is also on Tribenzor for his blood pressure.  He did quit smoking.  He has a history of hypogonadism but has not been on any medications due to the cost.  He was scheduled for follow-up colonoscopy in 2024. The following portions of the patient's history were reviewed and updated as appropriate: allergies, current medications, past medical history, past social history and problem list.  ROS as in subjective above.     Objective:    Physical Exam Alert and in no distress otherwise not examined. Hemoglobin A1c is 8.0 Lab Review    Latest Ref Rng & Units 08/03/2021    4:48 PM 07/15/2021    2:25 PM 07/10/2021   12:29 AM 07/09/2021   10:47 AM 03/30/2021    4:48 PM  Diabetic Labs  HbA1c 4.0 - 5.6 % 7.7    9.2    9.1    Creatinine 0.76 - 1.27 mg/dL  1.02   0.94   1.31         09/02/2021    1:16 PM 08/03/2021    4:19 PM 07/17/2021    8:51 AM 07/15/2021    2:01 PM 07/11/2021    9:00 AM  BP/Weight  Systolic BP  660 630 160 109  Diastolic BP  78 72 70 76  Wt. (Lbs) 174 174.4 173 175.4   BMI 24.97 kg/m2 25.02 kg/m2 24.82 kg/m2 24.46 kg/m2       Latest Ref Rng & Units 12/08/2020    9:45 AM 02/12/2016   12:00 AM  Foot/eye exam completion dates  Eye  Exam No Retinopathy  No Retinopathy       Foot Form Completion  Done      This result is from an external source.    Fin  reports that he quit smoking about 7 months ago. His smoking use included cigarettes. He smoked an average of .5 packs per day. He has never used smokeless tobacco. He reports current alcohol use of about 4.0 standard drinks per week. He reports that he does not use drugs.     Assessment & Plan:  Controlled type 2 diabetes mellitus with complication, with long-term current use of insulin ([place]) - Plan: POCT glycosylated hemoglobin (Hb A1C), CBC with Differential/Platelet, Comprehensive metabolic panel, Lipid panel  Polyp of colon, unspecified part of colon, unspecified type  Hypogonadism, male - Plan: Testosterone  Hypertension complicating diabetes ([place])  Hyperlipidemia associated with type 2 diabetes mellitus ([place]) - Plan: Lipid panel  Allergic rhinitis due to pollen, unspecified seasonality    Rx changes: none Education: Reviewed 'ABCs' of diabetes management (respective goals in parentheses):  A1C (<7), blood pressure (<130/80), and cholesterol (LDL <100). Compliance at  present is estimated to be fair. Efforts to improve compliance (if necessary) will be directed at  increase insulin by 2 units every 2 days until his blood sugar is 120 in the morning. . Follow up: 4 months Also recommend he try to take the Protonix on an every other day basis.  He will follow-up with GI in 2024.

## 2021-11-27 ENCOUNTER — Telehealth: Payer: Self-pay

## 2021-11-27 LAB — COMPREHENSIVE METABOLIC PANEL
ALT: 27 IU/L (ref 0–44)
AST: 25 IU/L (ref 0–40)
Albumin/Globulin Ratio: 1.6 (ref 1.2–2.2)
Albumin: 4.2 g/dL (ref 3.8–4.8)
Alkaline Phosphatase: 89 IU/L (ref 44–121)
BUN/Creatinine Ratio: 12 (ref 10–24)
BUN: 11 mg/dL (ref 8–27)
Bilirubin Total: 0.8 mg/dL (ref 0.0–1.2)
CO2: 25 mmol/L (ref 20–29)
Calcium: 10 mg/dL (ref 8.6–10.2)
Chloride: 99 mmol/L (ref 96–106)
Creatinine, Ser: 0.91 mg/dL (ref 0.76–1.27)
Globulin, Total: 2.6 g/dL (ref 1.5–4.5)
Glucose: 76 mg/dL (ref 70–99)
Potassium: 3.6 mmol/L (ref 3.5–5.2)
Sodium: 138 mmol/L (ref 134–144)
Total Protein: 6.8 g/dL (ref 6.0–8.5)
eGFR: 91 mL/min/{1.73_m2} (ref >59)

## 2021-11-27 LAB — CBC WITH DIFFERENTIAL/PLATELET
Basophils Absolute: 0 10*3/uL (ref 0.0–0.2)
Basos: 1 %
EOS (ABSOLUTE): 0.1 10*3/uL (ref 0.0–0.4)
Eos: 3 %
Hematocrit: 50.2 % (ref 37.5–51.0)
Hemoglobin: 17 g/dL (ref 13.0–17.7)
Immature Grans (Abs): 0 10*3/uL (ref 0.0–0.1)
Immature Granulocytes: 0 %
Lymphocytes Absolute: 1.3 10*3/uL (ref 0.7–3.1)
Lymphs: 31 %
MCH: 28.1 pg (ref 26.6–33.0)
MCHC: 33.9 g/dL (ref 31.5–35.7)
MCV: 83 fL (ref 79–97)
Monocytes Absolute: 0.6 10*3/uL (ref 0.1–0.9)
Monocytes: 14 %
Neutrophils Absolute: 2.1 10*3/uL (ref 1.4–7.0)
Neutrophils: 51 %
Platelets: 127 10*3/uL — ABNORMAL LOW (ref 150–450)
RBC: 6.06 x10E6/uL — ABNORMAL HIGH (ref 4.14–5.80)
RDW: 15.1 % (ref 11.6–15.4)
WBC: 4.1 10*3/uL (ref 3.4–10.8)

## 2021-11-27 LAB — LIPID PANEL
Chol/HDL Ratio: 3.1 ratio (ref 0.0–5.0)
Cholesterol, Total: 132 mg/dL (ref 100–199)
HDL: 42 mg/dL (ref >39)
LDL Chol Calc (NIH): 71 mg/dL (ref 0–99)
Triglycerides: 99 mg/dL (ref 0–149)
VLDL Cholesterol Cal: 19 mg/dL (ref 5–40)

## 2021-11-27 LAB — POCT GLYCOSYLATED HEMOGLOBIN (HGB A1C): Hemoglobin A1C: 8 % — AB (ref 4.0–5.6)

## 2021-11-27 LAB — TESTOSTERONE: Testosterone: 326 ng/dL (ref 264–916)

## 2021-11-27 NOTE — Telephone Encounter (Signed)
Pt informed of labs results

## 2021-12-09 ENCOUNTER — Telehealth: Payer: Self-pay | Admitting: Family Medicine

## 2021-12-09 NOTE — Telephone Encounter (Signed)
Left message for patient to call back and schedule Medicare Annual Wellness Visit (AWV) either virtually or in office. I left my number for patient to call [phone].  Last AWV 12/08/20 ; please schedule at anytime with health coach

## 2021-12-11 ENCOUNTER — Ambulatory Visit (INDEPENDENT_AMBULATORY_CARE_PROVIDER_SITE_OTHER): Payer: Medicare Other

## 2021-12-11 VITALS — Ht 71.0 in | Wt 167.0 lb

## 2021-12-11 DIAGNOSIS — Z0000 Encounter for general adult medical examination without abnormal findings: Secondary | ICD-10-CM

## 2021-12-11 NOTE — Progress Notes (Signed)
I connected with Micheal Leblanc today by telephone and verified that I am speaking with the correct person using two identifiers. Location patient: home Location provider: work Persons participating in the virtual visit: Micheal Leblanc, Glenna Durand LPN.   I discussed the limitations, risks, security and privacy concerns of performing an evaluation and management service by telephone and the availability of in person appointments. I also discussed with the patient that there may be a patient responsible charge related to this service. The patient expressed understanding and verbally consented to this telephonic visit.    Interactive audio and video telecommunications were attempted between this provider and patient, however failed, due to patient having technical difficulties OR patient did not have access to video capability.  We continued and completed visit with audio only.     Vital signs may be patient reported or missing.  Subjective:   Micheal Leblanc is a 71 y.o. male who presents for Medicare Annual/Subsequent preventive examination.  Review of Systems     Cardiac Risk Factors include: advanced age (>63mn, >>16women);diabetes mellitus;dyslipidemia;hypertension;male gender     Objective:    Today's Vitals   12/11/21 1356  Weight: 167 lb (75.8 kg)  Height: '5\' 11"'$  (1.803 m)   Body mass index is 23.29 kg/m.     12/11/2021    2:00 PM 07/09/2021    8:25 PM 12/08/2020    9:41 AM 11/13/2019    8:52 AM 06/05/2014    8:54 AM  Advanced Directives  Does Patient Have a Medical Advance Directive? No Yes No No No  Type of Advance Directive  [place]    Does patient want to make changes to medical advance directive?  No - Patient declined     Copy of [place] Chart?  No - copy requested     Would patient like information on creating a medical advance directive?   Yes (MAU/Ambulatory/Procedural Areas - Information given) No - Patient  declined     Current Medications (verified) Outpatient Encounter Medications as of 12/11/2021  Medication Sig   Accu-Chek Softclix Lancets lancets Use as instructed   atorvastatin (LIPITOR) 20 MG tablet TAKE 1 TABLET BY MOUTH EVERY DAY (Patient taking differently: Take 20 mg by mouth daily.)   glucose blood test strip Use as instructed   Insulin Pen Needle (PEN NEEDLES) 31G X 8 MM MISC Use this for lantus solorstar pen   metFORMIN (GLUCOPHAGE-XR) 750 MG 24 hr tablet Take 1 tablet (750 mg total) by mouth 2 (two) times daily. (Patient taking differently: Take 750 mg by mouth 2 (two) times daily.)   Olmesartan-amLODIPine-HCTZ (TRIBENZOR) 40-5-25 MG TABS Take 1 tablet by mouth daily.   pantoprazole (PROTONIX) 40 MG tablet Take 1 tablet (40 mg total) by mouth daily.   pioglitazone (ACTOS) 30 MG tablet TAKE 1 TABLET BY MOUTH EVERY DAY (Patient taking differently: Take 30 mg by mouth daily.)   insulin glargine (LANTUS SOLOSTAR) 100 UNIT/ML Solostar Pen Inject 55 Units into the skin daily.   No facility-administered encounter medications on file as of 12/11/2021.    Allergies (verified) Patient has no known allergies.   History: Past Medical History:  Diagnosis Date   Allergy    Diabetes mellitus    ED (erectile dysfunction)    Hx of colonic polyps    Hyperlipidemia    Hypertension    Past Surgical History:  Procedure Laterality Date   COLONOSCOPY     POLYPECTOMY  Family History  Problem Relation Age of Onset   Arthritis Mother    Colon cancer Neg Hx    Rectal cancer Neg Hx    Stomach cancer Neg Hx    Colon polyps Neg Hx    Diabetes Neg Hx    Social History   Socioeconomic History   Marital status: Single    Spouse name: Not on file   Number of children: Not on file   Years of education: Not on file   Highest education level: Not on file  Occupational History   Not on file  Tobacco Use   Smoking status: Former    Packs/day: 0.50    Types: Cigarettes    Quit date:  04/06/2021    Years since quitting: 0.6   Smokeless tobacco: Never  Vaping Use   Vaping Use: Never used  Substance and Sexual Activity   Alcohol use: Yes    Alcohol/week: 4.0 standard drinks of alcohol    Types: 2 Cans of beer, 2 Shots of liquor per week    Comment: Hemoglobin A1c is 7.7 hemoglobin A1c is 7.7 hemoglobin A1c is 7.7 no change 3-4 on the weekend.   Drug use: No   Sexual activity: Not Currently  Other Topics Concern   Not on file  Social History Narrative   Not on file   Social Determinants of Health   Financial Resource Strain: Low Risk  (12/11/2021)   Overall Financial Resource Strain (CARDIA)    Difficulty of Paying Living Expenses: Not hard at all  Food Insecurity: No Food Insecurity (12/11/2021)   Hunger Vital Sign    Worried About Running Out of Food in the Last Year: Never true    Ran Out of Food in the Last Year: Never true  Transportation Needs: No Transportation Needs (12/11/2021)   PRAPARE - Hydrologist (Medical): No    Lack of Transportation (Non-Medical): No  Physical Activity: Inactive (12/11/2021)   Exercise Vital Sign    Days of Exercise per Week: 0 days    Minutes of Exercise per Session: 0 min  Stress: No Stress Concern Present (12/11/2021)   [place]    Feeling of Stress : Not at all  Social Connections: Not on file    Tobacco Counseling Counseling given: Not Answered   Clinical Intake:  Pre-visit preparation completed: Yes  Pain : No/denies pain     Nutritional Status: BMI of 19-24  Normal Nutritional Risks: None Diabetes: Yes  How often do you need to have someone help you when you read instructions, pamphlets, or other written materials from your doctor or pharmacy?: 1 - Never  Diabetic? Yes Nutrition Risk Assessment:  Has the patient had any N/V/D within the last 2 months?  No  Does the patient have any non-healing wounds?  No   Has the patient had any unintentional weight loss or weight gain?  No   Diabetes:  Is the patient diabetic?  Yes  If diabetic, was a CBG obtained today?  No  Did the patient bring in their glucometer from home?  No  How often do you monitor your CBG's? Every other day.   Financial Strains and Diabetes Management:  Are you having any financial strains with the device, your supplies or your medication? No .  Does the patient want to be seen by Chronic Care Management for management of their diabetes?  No  Would the patient like to  be referred to a Nutritionist or for Diabetic Management?  No   Diabetic Exams:  Diabetic Eye Exam: Overdue for diabetic eye exam. Pt has been advised about the importance in completing this exam. Patient advised to call and schedule an eye exam. Diabetic Foot Exam: Overdue, Pt has been advised about the importance in completing this exam. Pt is scheduled for diabetic foot exam on next appointment.   Interpreter Needed?: No  Information entered by :: NAllen LPN   Activities of Daily Living    12/11/2021    2:01 PM 07/09/2021    8:27 PM  In your present state of health, do you have any difficulty performing the following activities:  Hearing? 0   Vision? 0   Difficulty concentrating or making decisions? 0   Walking or climbing stairs? 0   Dressing or bathing? 0   Doing errands, shopping? 0 0  Preparing Food and eating ? N   Using the Toilet? N   In the past six months, have you accidently leaked urine? Y   Do you have problems with loss of bowel control? N   Managing your Medications? N   Managing your Finances? N   Housekeeping or managing your Housekeeping? N     Patient Care Team: Denita Lung, MD as PCP - General (Family Medicine)  Indicate any recent Medical Services you may have received from other than [facility] providers in the past year (date may be approximate).     Assessment:   This is a routine wellness examination for  Micheal Leblanc.  Hearing/Vision screen Vision Screening - Comments:: Regular eye exams, Dr. Venetia Maxon  Dietary issues and exercise activities discussed: Current Exercise Habits: The patient does not participate in regular exercise at present   Goals Addressed             This Visit's Progress    Patient Stated       12/11/2021, be a little more active       Depression Screen    12/11/2021    2:01 PM 12/08/2020    9:42 AM 04/20/2016    4:17 PM 04/17/2014    4:20 PM  PHQ 2/9 Scores  PHQ - 2 Score 0 1 0 0    Fall Risk    12/11/2021    2:01 PM 12/08/2020    9:42 AM 04/20/2016    4:17 PM  Fall Risk   Falls in the past year? 0 0 No  Number falls in past yr: 0 0   Injury with Fall? 0 0   Risk for fall due to : Medication side effect No Fall Risks   Follow up Falls evaluation completed;Education provided;Falls prevention discussed Falls evaluation completed     FALL RISK PREVENTION PERTAINING TO THE HOME:  Any stairs in or around the home? Yes  If so, are there any without handrails? No  Home free of loose throw rugs in walkways, pet beds, electrical cords, etc? Yes  Adequate lighting in your home to reduce risk of falls? Yes   ASSISTIVE DEVICES UTILIZED TO PREVENT FALLS:  Life alert? No  Use of a cane, walker or w/c? No  Grab bars in the bathroom? No  Shower chair or bench in shower? No  Elevated toilet seat or a handicapped toilet? Yes   TIMED UP AND GO:  Was the test performed? No .      Cognitive Function:        12/11/2021    2:03 PM  6CIT  Screen  What Year? 0 points  What month? 0 points  What time? 0 points  Count back from 20 0 points  Months in reverse 0 points  Repeat phrase 6 points  Total Score 6 points    Immunizations Immunization History  Administered Date(s) Administered   DTaP 10/20/1994, 10/15/2005   Fluad Quad(high Dose 65+) 07/15/2021   Hepatitis B 09/06/2011, 02/07/2012   Hepatitis B, adult 04/17/2014   PFIZER Comirnaty(Gray  Top)Covid-19 Tri-Sucrose Vaccine 12/08/2020   PFIZER(Purple Top)SARS-COV-2 Vaccination 11/14/2019, 12/07/2019   Pneumococcal Conjugate-13 04/17/2014   Pneumococcal Polysaccharide-23 10/22/2015   Tdap 10/22/2015   Zoster, Live 08/26/2011    TDAP status: Up to date  Flu Vaccine status: Up to date  Pneumococcal vaccine status: Up to date  Covid-19 vaccine status: Completed vaccines  Qualifies for Shingles Vaccine? Yes   Zostavax completed Yes   Shingrix Completed?: No.    Education has been provided regarding the importance of this vaccine. Patient has been advised to call insurance company to determine out of pocket expense if they have not yet received this vaccine. Advised may also receive vaccine at local pharmacy or Health Dept. Verbalized acceptance and understanding.  Screening Tests Health Maintenance  Topic Date Due   OPHTHALMOLOGY EXAM  02/11/2017   COVID-19 Vaccine (4 - Pfizer series) 02/02/2021   FOOT EXAM  12/08/2021   Zoster Vaccines- Shingrix (1 of 2) 02/26/2022 (Originally 06/26/2001)   INFLUENZA VACCINE  01/26/2022   HEMOGLOBIN A1C  05/28/2022   COLONOSCOPY (Pts 45-19yr Insurance coverage will need to be confirmed)  10/26/2022   TETANUS/TDAP  10/21/2025   Hepatitis C Screening  Completed   HPV VACCINES  Aged Out   Pneumonia Vaccine 71 Years old  DNeyMaintenance Due  Topic Date Due   OPHTHALMOLOGY EXAM  02/11/2017   COVID-19 Vaccine (4 - Pfizer series) 02/02/2021   FOOT EXAM  12/08/2021    Colorectal cancer screening: Type of screening: Colonoscopy. Completed 10/25/2017. Repeat every 5 years  Lung Cancer Screening: (Low Dose CT Chest recommended if Age 157-80years, 30 pack-year currently smoking OR have quit w/in 15years.) does not qualify.   Lung Cancer Screening Referral: no  Additional Screening:  Hepatitis C Screening: does qualify; Completed 12/08/2020  Vision Screening: Recommended annual ophthalmology  exams for early detection of glaucoma and other disorders of the eye. Is the patient up to date with their annual eye exam?  No  Who is the provider or what is the name of the office in which the patient attends annual eye exams? none If pt is not established with a provider, would they like to be referred to a provider to establish care? No .   Dental Screening: Recommended annual dental exams for proper oral hygiene  Community Resource Referral / Chronic Care Management: CRR required this visit?  No   CCM required this visit?  No      Plan:     I have personally reviewed and noted the following in the patient's chart:   Medical and social history Use of alcohol, tobacco or illicit drugs  Current medications and supplements including opioid prescriptions. Patient is not currently taking opioid prescriptions. Functional ability and status Nutritional status Physical activity Advanced directives List of other physicians Hospitalizations, surgeries, and ER visits in previous 12 months Vitals Screenings to include cognitive, depression, and falls Referrals and appointments  In addition, I have reviewed and discussed with patient certain preventive protocols, quality metrics, and  best practice recommendations. A written personalized care plan for preventive services as well as general preventive health recommendations were provided to patient.     Kellie Simmering, LPN   09/30/5911   Nurse Notes: none  Due to this being a virtual visit, the after visit summary with patients personalized plan was offered to patient via mail or my-chart. Patient would like to access on my-chart

## 2021-12-11 NOTE — Patient Instructions (Signed)
Micheal Leblanc , Thank you for taking time to come for your Medicare Wellness Visit. I appreciate your ongoing commitment to your health goals. Please review the following plan we discussed and let me know if I can assist you in the future.   Screening recommendations/referrals: Colonoscopy: completed 10/25/2017, due 10/26/2022 Recommended yearly ophthalmology/optometry visit for glaucoma screening and checkup Recommended yearly dental visit for hygiene and checkup  Vaccinations: Influenza vaccine: due 01/26/2022 Pneumococcal vaccine: completed 10/22/2015 Tdap vaccine: completed 10/22/2015, due 10/21/2025 Shingles vaccine: discussed   Covid-19:  12/08/2020, 12/07/2019, 11/14/2019  Advanced directives: Advance directive discussed with you today.   Conditions/risks identified: none  Next appointment: Follow up in one year for your annual wellness visit.   Preventive Care 21 Years and Older, Male Preventive care refers to lifestyle choices and visits with your health care provider that can promote health and wellness. What does preventive care include? A yearly physical exam. This is also called an annual well check. Dental exams once or twice a year. Routine eye exams. Ask your health care provider how often you should have your eyes checked. Personal lifestyle choices, including: Daily care of your teeth and gums. Regular physical activity. Eating a healthy diet. Avoiding tobacco and drug use. Limiting alcohol use. Practicing safe sex. Taking low doses of aspirin every day. Taking vitamin and mineral supplements as recommended by your health care provider. What happens during an annual well check? The services and screenings done by your health care provider during your annual well check will depend on your age, overall health, lifestyle risk factors, and family history of disease. Counseling  Your health care provider may ask you questions about your: Alcohol use. Tobacco use. Drug  use. Emotional well-being. Home and relationship well-being. Sexual activity. Eating habits. History of falls. Memory and ability to understand (cognition). Work and work Statistician. Screening  You may have the following tests or measurements: Height, weight, and BMI. Blood pressure. Lipid and cholesterol levels. These may be checked every 5 years, or more frequently if you are over 77 years old. Skin check. Lung cancer screening. You may have this screening every year starting at age 35 if you have a 30-pack-year history of smoking and currently smoke or have quit within the past 15 years. Fecal occult blood test (FOBT) of the stool. You may have this test every year starting at age 66. Flexible sigmoidoscopy or colonoscopy. You may have a sigmoidoscopy every 5 years or a colonoscopy every 10 years starting at age 98. Prostate cancer screening. Recommendations will vary depending on your family history and other risks. Hepatitis C blood test. Hepatitis B blood test. Sexually transmitted disease (STD) testing. Diabetes screening. This is done by checking your blood sugar (glucose) after you have not eaten for a while (fasting). You may have this done every 1-3 years. Abdominal aortic aneurysm (AAA) screening. You may need this if you are a current or former smoker. Osteoporosis. You may be screened starting at age 40 if you are at high risk. Talk with your health care provider about your test results, treatment options, and if necessary, the need for more tests. Vaccines  Your health care provider may recommend certain vaccines, such as: Influenza vaccine. This is recommended every year. Tetanus, diphtheria, and acellular pertussis (Tdap, Td) vaccine. You may need a Td booster every 10 years. Zoster vaccine. You may need this after age 89. Pneumococcal 13-valent conjugate (PCV13) vaccine. One dose is recommended after age 25. Pneumococcal polysaccharide (PPSV23) vaccine. One dose  is  recommended after age 63. Talk to your health care provider about which screenings and vaccines you need and how often you need them. This information is not intended to replace advice given to you by your health care provider. Make sure you discuss any questions you have with your health care provider. Document Released: 07/11/2015 Document Revised: 03/03/2016 Document Reviewed: 04/15/2015 Elsevier Interactive Patient Education  2017 [place] Prevention in the Home Falls can cause injuries. They can happen to people of all ages. There are many things you can do to make your home safe and to help prevent falls. What can I do on the outside of my home? Regularly fix the edges of walkways and driveways and fix any cracks. Remove anything that might make you trip as you walk through a door, such as a raised step or threshold. Trim any bushes or trees on the path to your home. Use bright outdoor lighting. Clear any walking paths of anything that might make someone trip, such as rocks or tools. Regularly check to see if handrails are loose or broken. Make sure that both sides of any steps have handrails. Any raised decks and porches should have guardrails on the edges. Have any leaves, snow, or ice cleared regularly. Use sand or salt on walking paths during winter. Clean up any spills in your garage right away. This includes oil or grease spills. What can I do in the bathroom? Use night lights. Install grab bars by the toilet and in the tub and shower. Do not use towel bars as grab bars. Use non-skid mats or decals in the tub or shower. If you need to sit down in the shower, use a plastic, non-slip stool. Keep the floor dry. Clean up any water that spills on the floor as soon as it happens. Remove soap buildup in the tub or shower regularly. Attach bath mats securely with double-sided non-slip rug tape. Do not have throw rugs and other things on the floor that can make you  trip. What can I do in the bedroom? Use night lights. Make sure that you have a light by your bed that is easy to reach. Do not use any sheets or blankets that are too big for your bed. They should not hang down onto the floor. Have a firm chair that has side arms. You can use this for support while you get dressed. Do not have throw rugs and other things on the floor that can make you trip. What can I do in the kitchen? Clean up any spills right away. Avoid walking on wet floors. Keep items that you use a lot in easy-to-reach places. If you need to reach something above you, use a strong step stool that has a grab bar. Keep electrical cords out of the way. Do not use floor polish or wax that makes floors slippery. If you must use wax, use non-skid floor wax. Do not have throw rugs and other things on the floor that can make you trip. What can I do with my stairs? Do not leave any items on the stairs. Make sure that there are handrails on both sides of the stairs and use them. Fix handrails that are broken or loose. Make sure that handrails are as long as the stairways. Check any carpeting to make sure that it is firmly attached to the stairs. Fix any carpet that is loose or worn. Avoid having throw rugs at the top or bottom of the stairs.  If you do have throw rugs, attach them to the floor with carpet tape. Make sure that you have a light switch at the top of the stairs and the bottom of the stairs. If you do not have them, ask someone to add them for you. What else can I do to help prevent falls? Wear shoes that: Do not have high heels. Have rubber bottoms. Are comfortable and fit you well. Are closed at the toe. Do not wear sandals. If you use a stepladder: Make sure that it is fully opened. Do not climb a closed stepladder. Make sure that both sides of the stepladder are locked into place. Ask someone to hold it for you, if possible. Clearly mark and make sure that you can  see: Any grab bars or handrails. First and last steps. Where the edge of each step is. Use tools that help you move around (mobility aids) if they are needed. These include: Canes. Walkers. Scooters. Crutches. Turn on the lights when you go into a dark area. Replace any light bulbs as soon as they burn out. Set up your furniture so you have a clear path. Avoid moving your furniture around. If any of your floors are uneven, fix them. If there are any pets around you, be aware of where they are. Review your medicines with your doctor. Some medicines can make you feel dizzy. This can increase your chance of falling. Ask your doctor what other things that you can do to help prevent falls. This information is not intended to replace advice given to you by your health care provider. Make sure you discuss any questions you have with your health care provider. Document Released: 04/10/2009 Document Revised: 11/20/2015 Document Reviewed: 07/19/2014 Elsevier Interactive Patient Education  2017 [employer].

## 2021-12-14 ENCOUNTER — Ambulatory Visit: Payer: Medicare Other | Admitting: Family Medicine

## 2021-12-14 DIAGNOSIS — E1169 Type 2 diabetes mellitus with other specified complication: Secondary | ICD-10-CM

## 2021-12-14 DIAGNOSIS — K5790 Diverticulosis of intestine, part unspecified, without perforation or abscess without bleeding: Secondary | ICD-10-CM

## 2021-12-14 DIAGNOSIS — E1159 Type 2 diabetes mellitus with other circulatory complications: Secondary | ICD-10-CM

## 2021-12-14 DIAGNOSIS — Z794 Long term (current) use of insulin: Secondary | ICD-10-CM

## 2021-12-14 DIAGNOSIS — E291 Testicular hypofunction: Secondary | ICD-10-CM

## 2021-12-14 DIAGNOSIS — J301 Allergic rhinitis due to pollen: Secondary | ICD-10-CM

## 2021-12-14 DIAGNOSIS — K635 Polyp of colon: Secondary | ICD-10-CM

## 2021-12-14 DIAGNOSIS — M1990 Unspecified osteoarthritis, unspecified site: Secondary | ICD-10-CM

## 2021-12-15 ENCOUNTER — Encounter: Payer: Self-pay | Admitting: Family Medicine

## 2021-12-15 ENCOUNTER — Ambulatory Visit (INDEPENDENT_AMBULATORY_CARE_PROVIDER_SITE_OTHER): Payer: Medicare Other | Admitting: Family Medicine

## 2021-12-15 VITALS — BP 140/80 | HR 65 | Temp 97.9°F | Ht 70.0 in | Wt 169.4 lb

## 2021-12-15 DIAGNOSIS — E291 Testicular hypofunction: Secondary | ICD-10-CM

## 2021-12-15 DIAGNOSIS — Z0000 Encounter for general adult medical examination without abnormal findings: Secondary | ICD-10-CM

## 2021-12-15 DIAGNOSIS — M1990 Unspecified osteoarthritis, unspecified site: Secondary | ICD-10-CM | POA: Diagnosis not present

## 2021-12-15 DIAGNOSIS — E118 Type 2 diabetes mellitus with unspecified complications: Secondary | ICD-10-CM | POA: Diagnosis not present

## 2021-12-15 DIAGNOSIS — K635 Polyp of colon: Secondary | ICD-10-CM

## 2021-12-15 DIAGNOSIS — Z794 Long term (current) use of insulin: Secondary | ICD-10-CM

## 2021-12-15 DIAGNOSIS — E1169 Type 2 diabetes mellitus with other specified complication: Secondary | ICD-10-CM | POA: Diagnosis not present

## 2021-12-15 DIAGNOSIS — J301 Allergic rhinitis due to pollen: Secondary | ICD-10-CM

## 2021-12-15 DIAGNOSIS — I152 Hypertension secondary to endocrine disorders: Secondary | ICD-10-CM

## 2021-12-15 DIAGNOSIS — E785 Hyperlipidemia, unspecified: Secondary | ICD-10-CM

## 2021-12-15 DIAGNOSIS — E1159 Type 2 diabetes mellitus with other circulatory complications: Secondary | ICD-10-CM

## 2021-12-15 NOTE — Progress Notes (Signed)
Complete physical exam  Patient: Micheal Leblanc   DOB: April 16, 1951   71 y.o. Male  MRN: [id number]  Subjective:    Chief Complaint  Patient presents with   AWV/ CPE    Fasting     Micheal Leblanc is a 71 y.o. male who presents today for a complete physical exam. He reports consuming a diabetic, 1500 calorie diet. Home exercise routine includes walking daily and doing stairs in home. He generally feels fairly well. He reports sleeping irregularly due to his work schedule.  Does have underlying allergies and is having no difficulty with them.  His diabetes seems to be under good control.  His last hemoglobin A1c was 8.0.  He continues on metformin, Lantus insulin, pioglitazone.  He is taking Protonix but states that he does not have any reflux symptoms.  Continues on his blood pressure medication without a problem.  Does not smoke and rarely drinks.  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  This does interfere with his sleep pattern.  He has a history of colonic polyp and is scheduled for repeat colonoscopy next year.  He does have a history of hypogonadism but presently is on no medication and is not interested in any at the present time.  He has minimal difficulty with arthritis that responds well to Tylenol and stretching.  Otherwise he has no concerns or complaints. Most recent fall risk assessment:    12/15/2021    2:18 PM  [place] in the past year? 0  Number falls in past yr: 0  Injury with Fall? 0  Risk for fall due to : No Fall Risks  Follow up Falls evaluation completed     Most recent depression screenings:    12/11/2021    2:01 PM 12/08/2020    9:42 AM  PHQ 2/9 Scores  PHQ - 2 Score 0 1      Patient Active Problem List   Diagnosis Date Noted   Allergic rhinitis 03/21/2020   Hyperlipidemia associated with type 2 diabetes mellitus ([place]) 04/17/2014   Arthritis 08/26/2011   Diverticulosis 12/16/2010   Colonic polyp  12/16/2010   Hypogonadism, male 12/16/2010   Hypertension complicating diabetes ([place]) 12/16/2010   Controlled diabetes mellitus type 2 with complications ([place]) 53/29/9242   Past Medical History:  Diagnosis Date   Allergy    Diabetes mellitus    ED (erectile dysfunction)    Hx of colonic polyps    Hyperlipidemia    Hypertension    Past Surgical History:  Procedure Laterality Date   COLONOSCOPY     POLYPECTOMY     Social History   Tobacco Use   Smoking status: Former    Packs/day: 0.50    Types: Cigarettes    Quit date: 04/06/2021    Years since quitting: 0.6   Smokeless tobacco: Never  Vaping Use   Vaping Use: Never used  Substance Use Topics   Alcohol use: Yes    Alcohol/week: 4.0 standard drinks of alcohol    Types: 2 Cans of beer, 2 Shots of liquor per week    Comment: Hemoglobin A1c is 7.7 hemoglobin A1c is 7.7 hemoglobin A1c is 7.7 no change 3-4 on the weekend.   Drug use: No   Family History  Problem Relation Age of Onset   Arthritis Mother    Colon cancer Neg Hx    Rectal cancer Neg Hx    Stomach cancer  Neg Hx    Colon polyps Neg Hx    Diabetes Neg Hx    No Known Allergies    Patient Care Team: Denita Lung, MD as PCP - General (Family Medicine)   Outpatient Medications Prior to Visit  Medication Sig Note   Accu-Chek Softclix Lancets lancets Use as instructed    atorvastatin (LIPITOR) 20 MG tablet TAKE 1 TABLET BY MOUTH EVERY DAY (Patient taking differently: Take 20 mg by mouth daily.)    glucose blood test strip Use as instructed    insulin glargine (LANTUS SOLOSTAR) 100 UNIT/ML Solostar Pen Inject 55 Units into the skin daily.    Insulin Pen Needle (PEN NEEDLES) 31G X 8 MM MISC Use this for lantus solorstar pen    metFORMIN (GLUCOPHAGE-XR) 750 MG 24 hr tablet Take 1 tablet (750 mg total) by mouth 2 (two) times daily. (Patient taking differently: Take 750 mg by mouth 2 (two) times daily.)    Olmesartan-amLODIPine-HCTZ (TRIBENZOR) 40-5-25 MG TABS  Take 1 tablet by mouth daily.    pantoprazole (PROTONIX) 40 MG tablet Take 1 tablet (40 mg total) by mouth daily. 12/15/2021: Last dose was sat.   pioglitazone (ACTOS) 30 MG tablet TAKE 1 TABLET BY MOUTH EVERY DAY (Patient taking differently: Take 30 mg by mouth daily.)    No facility-administered medications prior to visit.    Review of Systems  All other systems reviewed and are negative.         Objective:  Alert and in no distress. Tympanic membranes and canals are normal. Pharyngeal area is normal. Neck is supple without adenopathy or thyromegaly. Cardiac exam shows a regular sinus rhythm without murmurs or gallops. Lungs are clear to auscultation.    BP 140/80   Pulse 65   Temp 97.9 F (36.6 C)   Ht 5' 10"  (1.778 m)   Wt 169 lb 6.4 oz (76.8 kg)   SpO2 95%   BMI 24.31 kg/m  BP Readings from Last 3 Encounters:  12/15/21 140/80  11/26/21 110/70  08/03/21 130/78   Wt Readings from Last 3 Encounters:  12/15/21 169 lb 6.4 oz (76.8 kg)  12/11/21 167 lb (75.8 kg)  11/26/21 164 lb 9.6 oz (74.7 kg)      Physical Exam   No results found for any visits on 12/15/21. Last CBC Lab Results  Component Value Date   WBC 4.1 11/26/2021   HGB 17.0 11/26/2021   HCT 50.2 11/26/2021   MCV 83 11/26/2021   MCH 28.1 11/26/2021   RDW 15.1 11/26/2021   PLT 127 (L) 57/26/2035   Last metabolic panel Lab Results  Component Value Date   GLUCOSE 76 11/26/2021   NA 138 11/26/2021   K 3.6 11/26/2021   CL 99 11/26/2021   CO2 25 11/26/2021   BUN 11 11/26/2021   CREATININE 0.91 11/26/2021   EGFR 91 11/26/2021   CALCIUM 10.0 11/26/2021   PHOS 2.9 07/15/2021   PROT 6.8 11/26/2021   ALBUMIN 4.2 11/26/2021   LABGLOB 2.6 11/26/2021   AGRATIO 1.6 11/26/2021   BILITOT 0.8 11/26/2021   ALKPHOS 89 11/26/2021   AST 25 11/26/2021   ALT 27 11/26/2021   ANIONGAP 5 07/10/2021   Last lipids Lab Results  Component Value Date   CHOL 132 11/26/2021   HDL 42 11/26/2021   LDLCALC 71  11/26/2021   TRIG 99 11/26/2021   CHOLHDL 3.1 11/26/2021   Last hemoglobin A1c Lab Results  Component Value Date   HGBA1C 8.0 (A) 11/26/2021  Last thyroid functions No results found for: "TSH", "T3TOTAL", "T4TOTAL", "THYROIDAB" Last vitamin D No results found for: "25OHVITD2", "25OHVITD3", "VD25OH"      Assessment & Plan:    Routine Health Maintenance and Physical Exam Arthritis  Hyperlipidemia associated with type 2 diabetes mellitus ([place])  Allergic rhinitis due to pollen, unspecified seasonality  Polyp of colon, unspecified part of colon, unspecified type  Hypogonadism, male  Hypertension complicating diabetes ([place])  Controlled type 2 diabetes mellitus with complication, with long-term current use of insulin ([place])  Immunization History  Administered Date(s) Administered   DTaP 10/20/1994, 10/15/2005   Fluad Quad(high Dose 65+) 07/15/2021   Hepatitis B 09/06/2011, 02/07/2012   Hepatitis B, adult 04/17/2014   PFIZER Comirnaty(Gray Top)Covid-19 Tri-Sucrose Vaccine 12/08/2020   PFIZER(Purple Top)SARS-COV-2 Vaccination 11/14/2019, 12/07/2019   Pneumococcal Conjugate-13 04/17/2014   Pneumococcal Polysaccharide-23 10/22/2015   Tdap 10/22/2015   Zoster, Live 08/26/2011    Health Maintenance  Topic Date Due   OPHTHALMOLOGY EXAM  02/11/2017   COVID-19 Vaccine (4 - Pfizer series) 02/02/2021   Zoster Vaccines- Shingrix (1 of 2) 02/26/2022 (Originally 06/26/2001)   INFLUENZA VACCINE  01/26/2022   HEMOGLOBIN A1C  05/28/2022   COLONOSCOPY (Pts 45-68yr Insurance coverage will need to be confirmed)  10/26/2022   FOOT EXAM  12/16/2022   TETANUS/TDAP  10/21/2025   Hepatitis C Screening  Completed   HPV VACCINES  Aged Out   Pneumonia Vaccine 71 Years old  Discontinued    Discussed health benefits of physical activity, and encouraged him to engage in regular exercise appropriate for his age and condition.  Problem List Items Addressed This Visit     Allergic rhinitis    Arthritis - Primary   Colonic polyp   Controlled diabetes mellitus type 2 with complications ([place]   Hyperlipidemia associated with type 2 diabetes mellitus ([place]   Hypertension complicating diabetes ([place]   Hypogonadism, male  Return here in 433 monthsfor diabetes check.  Encouraged him to continue on his present medication regimen.  Encouraged him to get the Shingrix shot and potentially get another COVID-vaccine.  He will hold off on the Protonix. Return in about 1 year (around 12/16/2022) for for awv/cpe and already has appt for diabetes.     JJill Alexanders MD

## 2022-02-19 IMAGING — US US ABDOMEN COMPLETE
1 series · 15 of 25 positions shown · non-contrast
Comparison: 12/16/2020

CLINICAL DATA: Thrombocytopenia

EXAM:
ABDOMEN ULTRASOUND COMPLETE

[Series 1: us abdomen complete mc & wl · 15 of 102 slices shown]
[im 1/102]
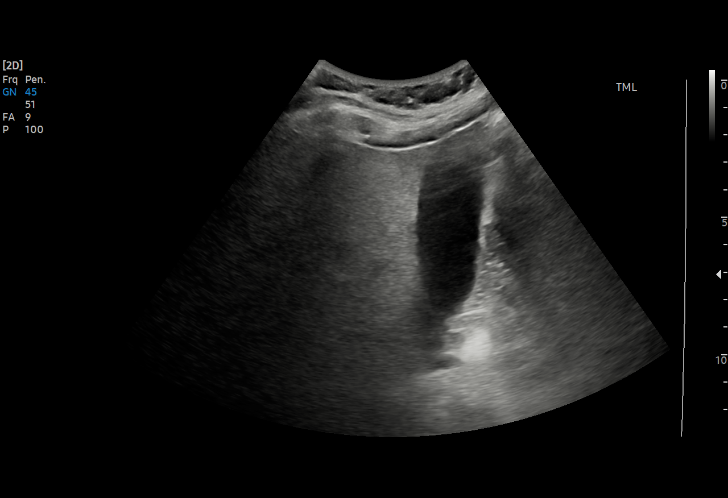
[im 9/102]
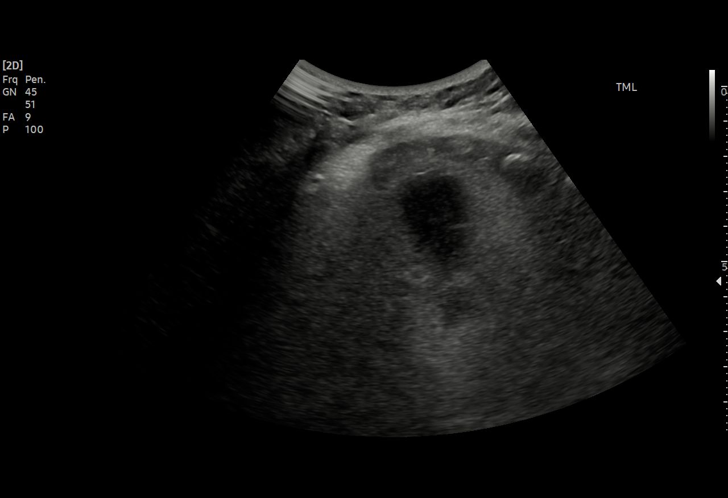
[im 17/102]
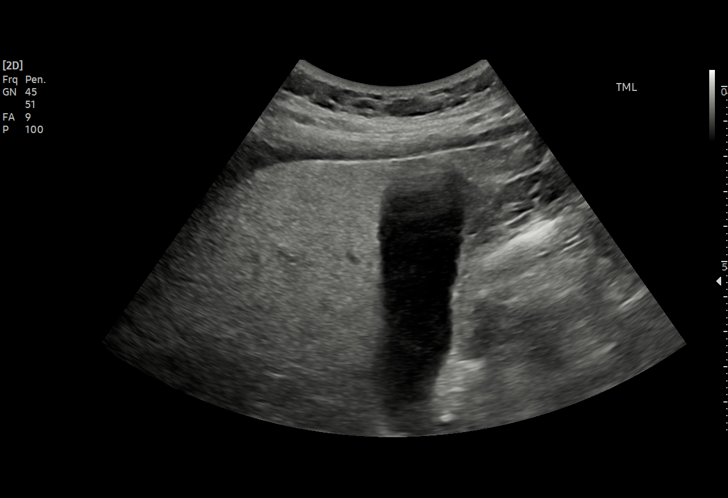
[im 22/102]
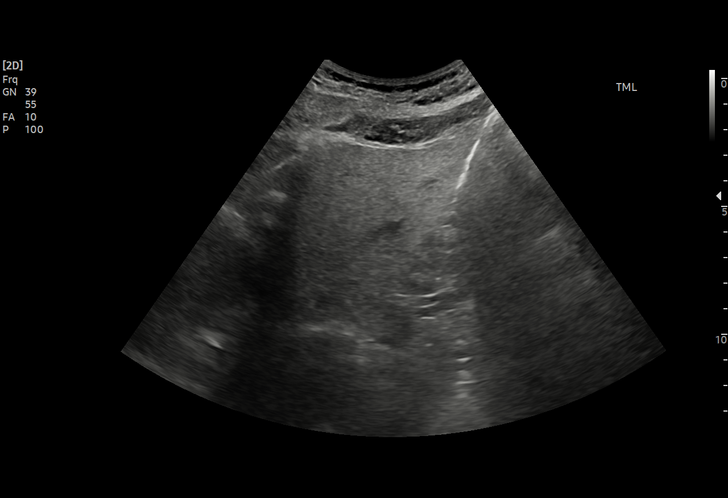
[im 30/102]
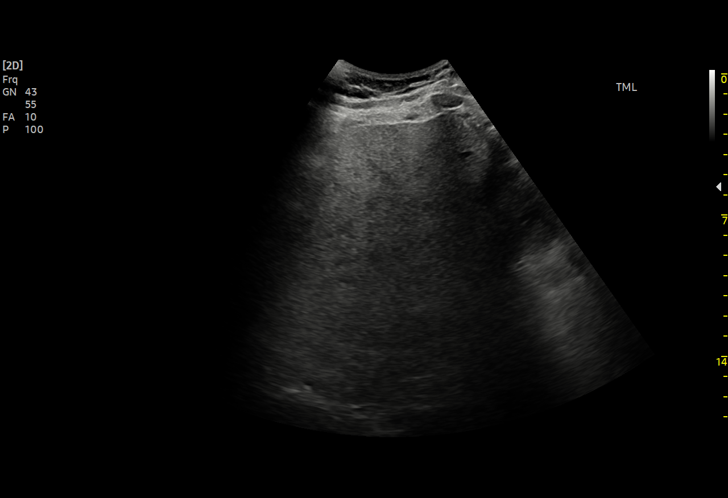
[im 38/102]
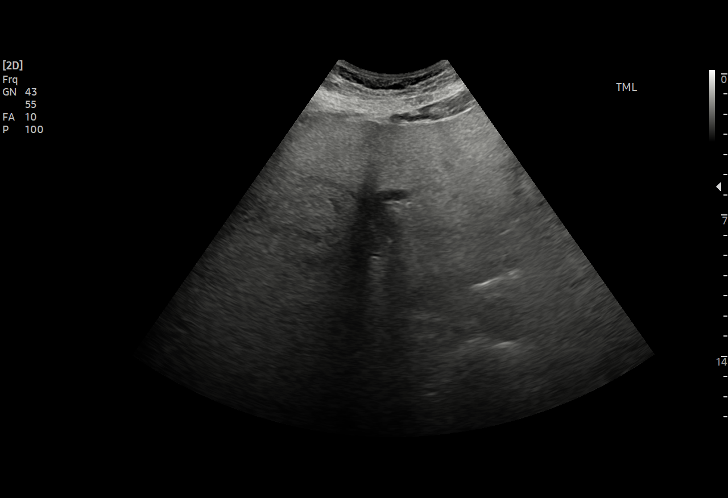
[im 43/102]
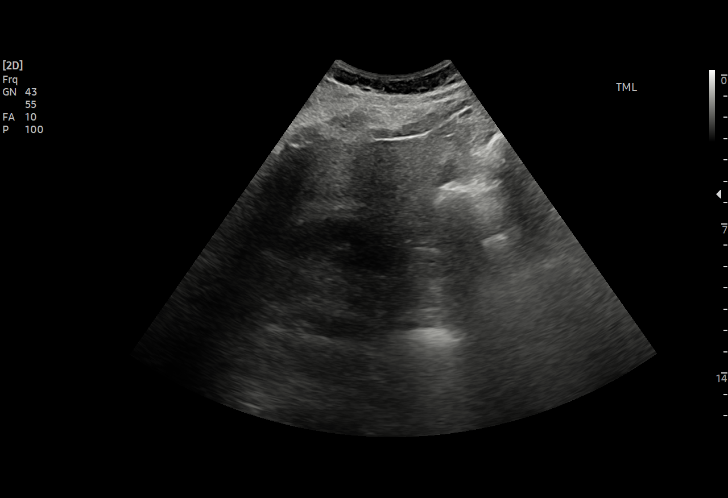
[im 51/102]
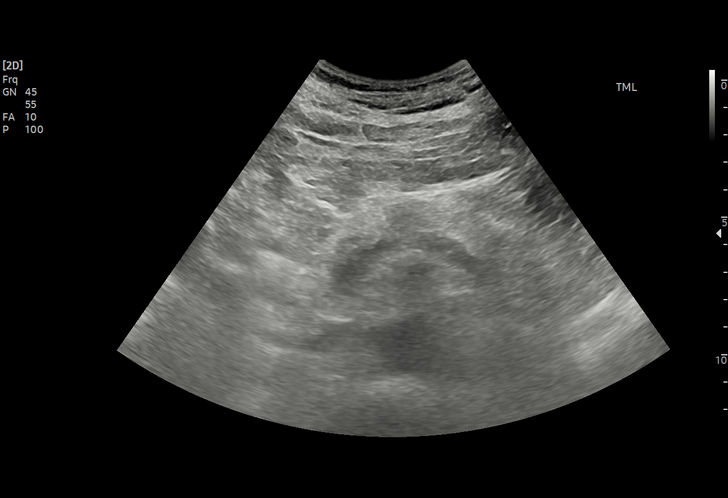
[im 59/102]
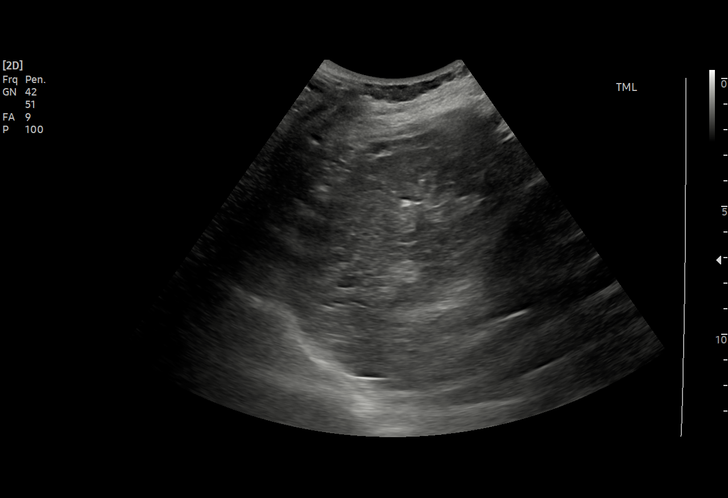
[im 64/102]
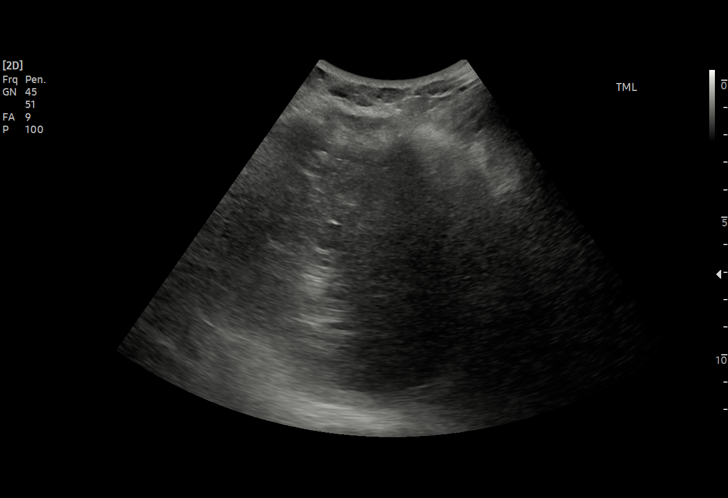
[im 72/102]
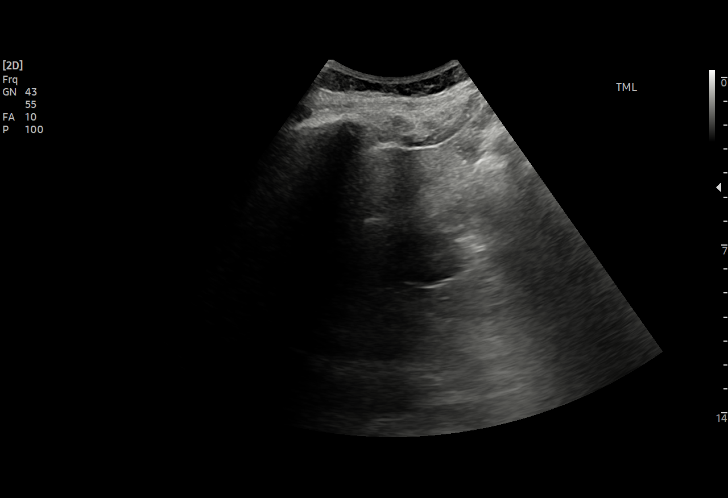
[im 80/102]
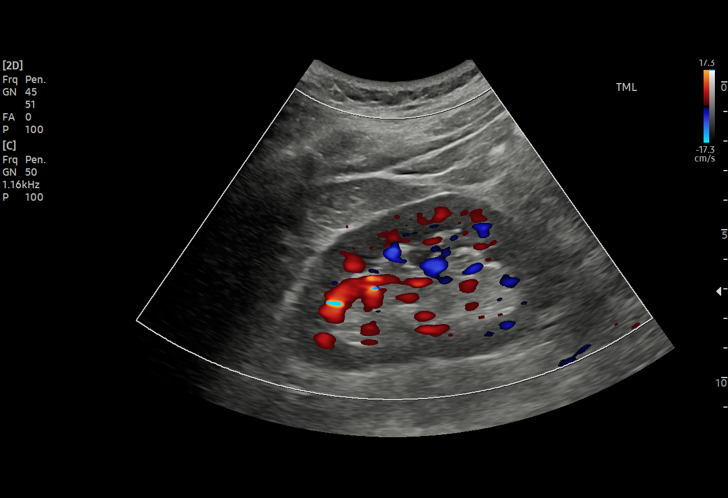
[im 85/102]
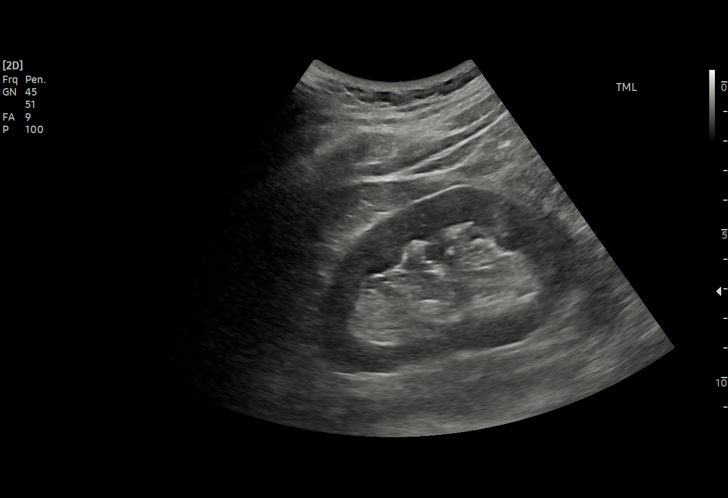
[im 93/102]
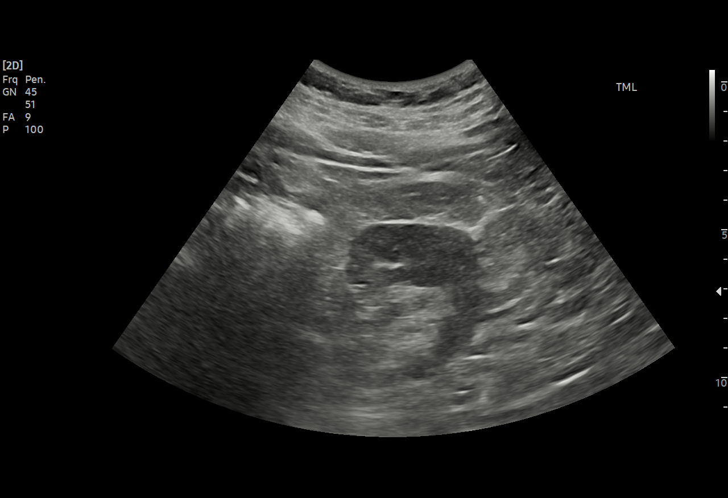
[im 102/102]
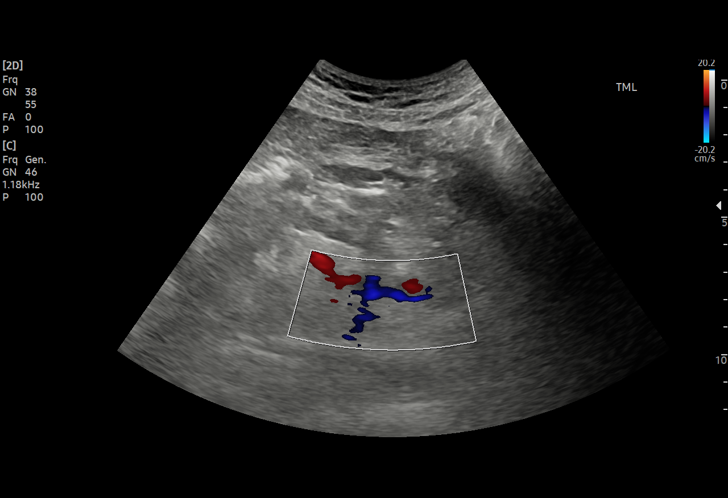

[15 of 25 positions shown; findings below may reference images not displayed]

FINDINGS: Gallbladder: Normally distended without stones or wall thickening.
No pericholecystic fluid or sonographic Murphy sign.

Common bile duct: Diameter: 2 mm, normal

Liver: Echogenic parenchyma, likely fatty infiltration though this
can be seen with cirrhosis and certain infiltrative disorders. No
focal hepatic mass or nodularity. Portal vein is patent on color
Doppler imaging with normal direction of blood flow towards the
liver.

IVC: Normal appearance

Pancreas: Normal appearance

Spleen: Normal appearance, 4.9 cm length

Right Kidney: Length: 10.2 cm. Normal morphology without mass or
hydronephrosis.

Left Kidney: Length: 9.8 cm. Normal morphology without mass or
hydronephrosis.

Abdominal aorta: Low caliber

Other findings: No free fluid
IMPRESSION: Probable fatty infiltration of liver as above.

Remainder of exam unremarkable.

## 2022-03-03 ENCOUNTER — Encounter: Payer: Self-pay | Admitting: Internal Medicine

## 2022-03-31 ENCOUNTER — Encounter: Payer: Self-pay | Admitting: Family Medicine

## 2022-03-31 ENCOUNTER — Ambulatory Visit (INDEPENDENT_AMBULATORY_CARE_PROVIDER_SITE_OTHER): Payer: Medicare Other | Admitting: Family Medicine

## 2022-03-31 VITALS — BP 116/70 | HR 57 | Temp 96.8°F | Wt 164.2 lb

## 2022-03-31 DIAGNOSIS — I152 Hypertension secondary to endocrine disorders: Secondary | ICD-10-CM | POA: Diagnosis not present

## 2022-03-31 DIAGNOSIS — K635 Polyp of colon: Secondary | ICD-10-CM | POA: Diagnosis not present

## 2022-03-31 DIAGNOSIS — Z8616 Personal history of COVID-19: Secondary | ICD-10-CM | POA: Diagnosis not present

## 2022-03-31 DIAGNOSIS — Z23 Encounter for immunization: Secondary | ICD-10-CM

## 2022-03-31 DIAGNOSIS — E785 Hyperlipidemia, unspecified: Secondary | ICD-10-CM | POA: Diagnosis not present

## 2022-03-31 DIAGNOSIS — Z794 Long term (current) use of insulin: Secondary | ICD-10-CM

## 2022-03-31 DIAGNOSIS — E1169 Type 2 diabetes mellitus with other specified complication: Secondary | ICD-10-CM | POA: Diagnosis not present

## 2022-03-31 DIAGNOSIS — E1159 Type 2 diabetes mellitus with other circulatory complications: Secondary | ICD-10-CM

## 2022-03-31 DIAGNOSIS — E118 Type 2 diabetes mellitus with unspecified complications: Secondary | ICD-10-CM

## 2022-03-31 LAB — POCT GLYCOSYLATED HEMOGLOBIN (HGB A1C): Hemoglobin A1C: 6.2 % — AB (ref 4.0–5.6)

## 2022-03-31 NOTE — Progress Notes (Unsigned)
Subjective:    Patient ID: Micheal Leblanc, male    DOB: 04/13/1951, 71 y.o.   MRN: [id number]  Micheal Leblanc is a 71 y.o. male who presents for follow-up of Type 2 diabetes mellitus.  Patient is checking home blood sugars.   Home blood sugar records: BGs have been labile ranging between 129 and 162 How often is blood sugars being checked: QD Current symptoms/problems include none and have been unchanged. Daily foot checks: yes   Any foot concerns: none Last eye exam: over due  Exercise:  staying active at least 20 minutes a day He continues on his Actos as well as metformin and having no difficulty with that.  He has made some dietary changes to help with getting his diabetes under better control.  He continues on Protonix on an as-needed basis.  He is taking his blood pressure medicine and having no difficulty with that. The following portions of the patient's history were reviewed and updated as appropriate: allergies, current medications, past medical history, past social history and problem list.  ROS as in subjective above.     Objective:    Physical Exam Alert and in no distress otherwise not examined. Hemoglobin A1c is 6.2 Blood pressure 116/70, pulse (!) 57, temperature (!) 96.8 F (36 C), weight 164 lb 3.2 oz (74.5 kg), SpO2 97 %.  Lab Review    Latest Ref Rng & Units 11/26/2021    4:35 PM 11/26/2021    8:26 AM 08/03/2021    4:48 PM 07/15/2021    2:25 PM 07/10/2021   12:29 AM  Diabetic Labs  HbA1c 4.0 - 5.6 %  8.0  7.7   9.2   Chol 100 - 199 mg/dL 132       HDL >39 mg/dL 42       Calc LDL 0 - 99 mg/dL 71       Triglycerides 0 - 149 mg/dL 99       Creatinine 0.76 - 1.27 mg/dL 0.91    1.02  0.94       03/31/2022    3:48 PM 12/15/2021    2:27 PM 12/11/2021    1:56 PM 11/26/2021    4:05 PM 09/02/2021    1:16 PM  BP/Weight  Systolic BP 956 213  086   Diastolic BP 70 80  70   Wt. (Lbs) 164.2 169.4 167 164.6 174  BMI 23.56 kg/m2 24.31 kg/m2 23.29 kg/m2 23.62 kg/m2 24.97  kg/m2      12/15/2021    2:30 PM 12/08/2020    9:45 AM  Foot/eye exam completion dates  Foot Form Completion Done Done    [name]  reports that he quit smoking about a year ago. His smoking use included cigarettes. He smoked an average of .5 packs per day. He has never used smokeless tobacco. He reports current alcohol use of about 4.0 standard drinks of alcohol per week. He reports that he does not use drugs.     Assessment & Plan:    Controlled type 2 diabetes mellitus with complication, with long-term current use of insulin ([place]) - Plan: POCT glycosylated hemoglobin (Hb A1C), POCT UA - Microalbumin  Need for influenza vaccination - Plan: Flu Vaccine QUAD High Dose(Fluad)  History of COVID-19  Hyperlipidemia associated with type 2 diabetes mellitus ([place])  Hypertension complicating diabetes ([place])  Polyp of colon, unspecified part of colon, unspecified type  I congratulated him on the good work that he is done and encouraged him to continue  with this.  He will be getting a colonoscopy next year. Recommend that he get the COVID, RSV and Shingrix vaccines.

## 2022-04-06 ENCOUNTER — Encounter: Payer: Self-pay | Admitting: Internal Medicine

## 2022-04-19 ENCOUNTER — Encounter: Payer: Self-pay | Admitting: Internal Medicine

## 2022-09-15 ENCOUNTER — Ambulatory Visit (INDEPENDENT_AMBULATORY_CARE_PROVIDER_SITE_OTHER): Payer: Medicare Other | Admitting: Family Medicine

## 2022-09-15 ENCOUNTER — Encounter: Payer: Self-pay | Admitting: Family Medicine

## 2022-09-15 ENCOUNTER — Ambulatory Visit
Admission: RE | Admit: 2022-09-15 | Discharge: 2022-09-15 | Disposition: A | Discharge status: Home / Self Care | Payer: Medicare Other | Source: Ambulatory Visit | Attending: Family Medicine | Admitting: Family Medicine

## 2022-09-15 VITALS — BP 170/90 | HR 78 | Temp 97.9°F | Resp 14 | Wt 161.4 lb

## 2022-09-15 DIAGNOSIS — S42032A Displaced fracture of lateral end of left clavicle, initial encounter for closed fracture: Secondary | ICD-10-CM | POA: Diagnosis not present

## 2022-09-15 DIAGNOSIS — M25512 Pain in left shoulder: Secondary | ICD-10-CM

## 2022-09-15 DIAGNOSIS — M19012 Primary osteoarthritis, left shoulder: Secondary | ICD-10-CM | POA: Diagnosis not present

## 2022-09-15 NOTE — Progress Notes (Addendum)
   Subjective:    Patient ID: Micheal Leblanc, male    DOB: 1951/06/23, 72 y.o.   MRN: [id number]  HPI He complains of a several month history of left shoulder pain.  No history of injury or overuse.  Motion in any direction causes discomfort.   Review of Systems     Objective:   Physical Exam Exam of the left shoulder shows limitation of abduction internal and external rotation.  No laxity noted.  Neer's and Hawkins test caused a clicking sensation.       Assessment & Plan:  Acute pain of left shoulder - Plan: DG Shoulder Left He is to take four Advil 3 times per day.  Discussed possible injection versus referral based on x-ray report. Today's blood pressure was elevated however previous pressures were in a good range and I will therefore watch this

## 2022-09-15 NOTE — Patient Instructions (Addendum)
Take 4 Advil 3 times per day. 

## 2022-09-16 ENCOUNTER — Telehealth: Payer: Self-pay | Admitting: Family Medicine

## 2022-09-16 NOTE — Telephone Encounter (Signed)
Micheal Leblanc called and stated he got his x ray done yesterday and was wondering if the results are back.

## 2022-09-17 ENCOUNTER — Telehealth: Payer: Self-pay | Admitting: Family Medicine

## 2022-09-17 DIAGNOSIS — S42002A Fracture of unspecified part of left clavicle, initial encounter for closed fracture: Secondary | ICD-10-CM

## 2022-09-17 NOTE — Telephone Encounter (Signed)
Pt asks for you to call him on Monday please.

## 2022-09-20 NOTE — Telephone Encounter (Signed)
Done

## 2022-09-21 ENCOUNTER — Ambulatory Visit (INDEPENDENT_AMBULATORY_CARE_PROVIDER_SITE_OTHER): Payer: Medicare Other | Admitting: Student

## 2022-09-21 VITALS — Ht 70.0 in | Wt 162.0 lb

## 2022-09-21 DIAGNOSIS — S42035A Nondisplaced fracture of lateral end of left clavicle, initial encounter for closed fracture: Secondary | ICD-10-CM | POA: Diagnosis not present

## 2022-09-21 NOTE — Progress Notes (Signed)
Chief Complaint: Left shoulder pain     History of Present Illness:    Micheal Leblanc is a 72 y.o. male presenting for evaluation of left shoulder pain.  He states that about 2 weeks ago he slipped in the bathroom and hit his shoulder against the wall.  He followed up with his primary care physician who ordered x-rays that showed a left distal clavicle fracture.  He says his pain is in 8/10 today.  He has been taking Advil as needed which does help some.  He says he is not able to lay on that side and has some trouble sleeping.  Patient has not been in a sling.  Denies any radiating pain, numbness, or tingling.  He works as a Presenter, broadcasting.   Surgical History:   None pertinent  PMH/PSH/Family History/Social History/Meds/Allergies:    Past Medical History:  Diagnosis Date   Allergy    Diabetes mellitus    ED (erectile dysfunction)    Hx of colonic polyps    Hyperlipidemia    Hypertension    Past Surgical History:  Procedure Laterality Date   COLONOSCOPY     POLYPECTOMY     Social History   Socioeconomic History   Marital status: Single    Spouse name: Not on file   Number of children: Not on file   Years of education: Not on file   Highest education level: Not on file  Occupational History   Not on file  Tobacco Use   Smoking status: Former    Packs/day: .5    Types: Cigarettes    Quit date: 04/06/2021    Years since quitting: 1.4   Smokeless tobacco: Never  Vaping Use   Vaping Use: Never used  Substance and Sexual Activity   Alcohol use: Yes    Alcohol/week: 4.0 standard drinks of alcohol    Types: 2 Cans of beer, 2 Shots of liquor per week    Comment: Hemoglobin A1c is 7.7 hemoglobin A1c is 7.7 hemoglobin A1c is 7.7 no change 3-4 on the weekend.   Drug use: No   Sexual activity: Not Currently  Other Topics Concern   Not on file  Social History Narrative   Not on file   Social Determinants of Health   Financial  Resource Strain: Low Risk  (12/11/2021)   Overall Financial Resource Strain (CARDIA)    Difficulty of Paying Living Expenses: Not hard at all  Food Insecurity: No Food Insecurity (12/11/2021)   Hunger Vital Sign    Worried About Running Out of Food in the Last Year: Never true    Ran Out of Food in the Last Year: Never true  Transportation Needs: No Transportation Needs (12/11/2021)   PRAPARE - Hydrologist (Medical): No    Lack of Transportation (Non-Medical): No  Physical Activity: Inactive (12/11/2021)   Exercise Vital Sign    Days of Exercise per Week: 0 days    Minutes of Exercise per Session: 0 min  Stress: No Stress Concern Present (12/11/2021)   [place]    Feeling of Stress : Not at all  Social Connections: Not on file   Family History  Problem Relation Age of Onset   Arthritis Mother    Colon cancer  Neg Hx    Rectal cancer Neg Hx    Stomach cancer Neg Hx    Colon polyps Neg Hx    Diabetes Neg Hx    No Known Allergies Current Outpatient Medications  Medication Sig Dispense Refill   atorvastatin (LIPITOR) 20 MG tablet TAKE 1 TABLET BY MOUTH EVERY DAY (Patient taking differently: Take 20 mg by mouth daily.) 90 tablet 1   metFORMIN (GLUCOPHAGE-XR) 750 MG 24 hr tablet Take 1 tablet (750 mg total) by mouth 2 (two) times daily. (Patient taking differently: Take 750 mg by mouth 2 (two) times daily.) 180 tablet 0   Olmesartan-amLODIPine-HCTZ (TRIBENZOR) 40-5-25 MG TABS Take 1 tablet by mouth daily. 90 tablet 3   pioglitazone (ACTOS) 30 MG tablet TAKE 1 TABLET BY MOUTH EVERY DAY (Patient taking differently: Take 30 mg by mouth daily.) 90 tablet 1   Accu-Chek Softclix Lancets lancets Use as instructed 200 each 12   glucose blood test strip Use as instructed 200 each 12   insulin glargine (LANTUS SOLOSTAR) 100 UNIT/ML Solostar Pen Inject 55 Units into the skin daily. 20.9 mL 3   Insulin Pen  Needle (PEN NEEDLES) 31G X 8 MM MISC Use this for lantus solorstar pen 100 each 1   pantoprazole (PROTONIX) 40 MG tablet Take 1 tablet (40 mg total) by mouth daily. 30 tablet 0   No current facility-administered medications for this visit.   No results found.  Review of Systems:   A ROS was performed including pertinent positives and negatives as documented in the HPI.  Physical Exam :   Constitutional: NAD and appears stated age Neurological: Alert and oriented Psych: Appropriate affect and cooperative Height 5\' 10"  (1.778 m), weight 162 lb (73.5 kg).   Comprehensive Musculoskeletal Exam:    Patient does have good passive range of motion to 90 degrees flexion and abduction as well as 30 degrees of external rotation.  Slight tenderness to palpation over distal clavicle with no obvious deformity.  Equal grip strength bilaterally.  Distal neurosensory exam intact.  Imaging:   Xray (left shoulder 3 views): Nondisplaced distal clavicle fracture   I personally reviewed and interpreted the radiographs.   Assessment:   72 y.o. male presenting for evaluation of a left distal clavicle fracture.  Fracture is nondisplaced and should heal well without further intervention.  I will place the patient in a sling today and advised to avoid any heavy lifting or overhead motions.  Can continue Advil for pain control.  Will plan to see him back in 2 weeks for reassessment and to hopefully begin progressing range of motion.  All questions and concerns addressed.   Plan :    -Follow-up in 2 weeks for reassessment     I personally saw and evaluated the patient, and participated in the management and treatment plan.  Marnee Spring, PA-C Orthopedics  This document was dictated using Systems analyst. A reasonable attempt at proof reading has been made to minimize errors.

## 2022-10-05 ENCOUNTER — Encounter: Payer: Self-pay | Admitting: Family Medicine

## 2022-10-05 ENCOUNTER — Ambulatory Visit (INDEPENDENT_AMBULATORY_CARE_PROVIDER_SITE_OTHER): Payer: Medicare Other | Admitting: Student

## 2022-10-05 ENCOUNTER — Ambulatory Visit (INDEPENDENT_AMBULATORY_CARE_PROVIDER_SITE_OTHER): Payer: Medicare Other | Admitting: Family Medicine

## 2022-10-05 ENCOUNTER — Encounter (HOSPITAL_BASED_OUTPATIENT_CLINIC_OR_DEPARTMENT_OTHER): Payer: Self-pay | Admitting: Student

## 2022-10-05 ENCOUNTER — Ambulatory Visit (HOSPITAL_BASED_OUTPATIENT_CLINIC_OR_DEPARTMENT_OTHER): Payer: Medicare Other

## 2022-10-05 VITALS — BP 138/80 | HR 61 | Wt 165.0 lb

## 2022-10-05 DIAGNOSIS — S42035A Nondisplaced fracture of lateral end of left clavicle, initial encounter for closed fracture: Secondary | ICD-10-CM

## 2022-10-05 DIAGNOSIS — E118 Type 2 diabetes mellitus with unspecified complications: Secondary | ICD-10-CM | POA: Diagnosis not present

## 2022-10-05 DIAGNOSIS — E785 Hyperlipidemia, unspecified: Secondary | ICD-10-CM | POA: Diagnosis not present

## 2022-10-05 DIAGNOSIS — E1159 Type 2 diabetes mellitus with other circulatory complications: Secondary | ICD-10-CM

## 2022-10-05 DIAGNOSIS — I152 Hypertension secondary to endocrine disorders: Secondary | ICD-10-CM | POA: Diagnosis not present

## 2022-10-05 DIAGNOSIS — M1990 Unspecified osteoarthritis, unspecified site: Secondary | ICD-10-CM

## 2022-10-05 DIAGNOSIS — S42032A Displaced fracture of lateral end of left clavicle, initial encounter for closed fracture: Secondary | ICD-10-CM | POA: Diagnosis not present

## 2022-10-05 DIAGNOSIS — E1169 Type 2 diabetes mellitus with other specified complication: Secondary | ICD-10-CM

## 2022-10-05 LAB — POCT GLYCOSYLATED HEMOGLOBIN (HGB A1C): Hemoglobin A1C: 7 % — AB (ref 4.0–5.6)

## 2022-10-05 NOTE — Progress Notes (Signed)
                         Subjective:    Patient ID: Micheal Leblanc, male    DOB: 1950-10-19, 72 y.o.   MRN: [id number]  Micheal Leblanc is a 72 y.o. male who presents for follow-up of Type 2 diabetes mellitus.  Home blood sugar records: fasting range: 120's Current symptoms/problems include paresthesia of the feet, polyuria, and dry mouth  and have been stable. Daily foot checks: yes   Any foot concerns: no How often blood sugars checked: every other day Exercise: Home exercise routine includes walking. Diet: well balanced He continues on active dose and metformin.  Lipitor is causing no difficulty.  He is also taking Tribenzor and having no difficulty with that.  He drinks usually 1 beverage per night when he gets home. The following portions of the patient's history were reviewed and updated as appropriate: allergies, current medications, past medical history, past social history and problem list.  ROS as in subjective above.     Objective:    Physical Exam Alert and in no distress otherwise not examined. Hemoglobin A1c is 7.0 Blood pressure 138/80, pulse 61, weight 165 lb (74.8 kg), SpO2 98 %.  Lab Review    Latest Ref Rng & Units 03/31/2022    4:19 PM 11/26/2021    4:35 PM 11/26/2021    8:26 AM 08/03/2021    4:48 PM 07/15/2021    2:25 PM  Diabetic Labs  HbA1c 4.0 - 5.6 % 6.2   8.0  7.7    Chol 100 - 199 mg/dL  009      HDL >23 mg/dL  42      Calc LDL 0 - 99 mg/dL  71      Triglycerides 0 - 149 mg/dL  99      Creatinine 3.00 - 1.27 mg/dL  7.62    2.63       08/29/5454   11:56 AM 10/05/2022   11:49 AM 09/21/2022    8:56 AM 09/15/2022    8:16 AM 09/15/2022    8:11 AM  BP/Weight  Systolic BP 138 142  170 170  Diastolic BP 80 80  90 92  Wt. (Lbs)  165 162  161.4  BMI  23.68 kg/m2 23.24 kg/m2  23.16 kg/m2      12/15/2021    2:30 PM 12/08/2020    9:45 AM  Foot/eye exam completion dates  Foot Form Completion Done Done    Micheal Leblanc  reports that he quit  smoking about 17 months ago. His smoking use included cigarettes. He smoked an average of .5 packs per day. He has never used smokeless tobacco. He reports current alcohol use of about 4.0 standard drinks of alcohol per week. He reports that he does not use drugs.     Assessment & Plan:    Hypertension complicating diabetes  Controlled type 2 diabetes mellitus with complication, with long-term current use of insulin - Plan: POCT glycosylated hemoglobin (Hb A1C)  Hyperlipidemia associated with type 2 diabetes mellitus  Arthritis I encouraged him to become more physically active to work on getting his A1c a little bit lower.  Otherwise he will continue on his present medication regimen.

## 2022-10-05 NOTE — Progress Notes (Signed)
Chief Complaint: Left shoulder pain     History of Present Illness:    Micheal Leblanc is a 72 y.o. male presenting for follow-up of left distal clavicle fracture he sustained about 3 weeks ago.  Overall he reports doing much better and says that his range of motion has been improving.  He still having some mild pain that is 2/10 today.  He is managing with Advil as needed.  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   Surgical History:   None pertinent  PMH/PSH/Family History/Social History/Meds/Allergies:    Past Medical History:  Diagnosis Date   Allergy    Diabetes mellitus    ED (erectile dysfunction)    Hx of colonic polyps    Hyperlipidemia    Hypertension    Past Surgical History:  Procedure Laterality Date   COLONOSCOPY     POLYPECTOMY     Social History   Socioeconomic History   Marital status: Single    Spouse name: Not on file   Number of children: Not on file   Years of education: Not on file   Highest education level: Not on file  Occupational History   Not on file  Tobacco Use   Smoking status: Former    Packs/day: .5    Types: Cigarettes    Quit date: 04/06/2021    Years since quitting: 1.4   Smokeless tobacco: Never  Vaping Use   Vaping Use: Never used  Substance and Sexual Activity   Alcohol use: Yes    Alcohol/week: 4.0 standard drinks of alcohol    Types: 2 Cans of beer, 2 Shots of liquor per week    Comment: Hemoglobin A1c is 7.7 hemoglobin A1c is 7.7 hemoglobin A1c is 7.7 no change 3-4 on the weekend.   Drug use: No   Sexual activity: Not Currently  Other Topics Concern   Not on file  Social History Narrative   Not on file   Social Determinants of Health   Financial Resource Strain: Low Risk  (12/11/2021)   Overall Financial Resource Strain (CARDIA)    Difficulty of Paying Living Expenses: Not hard at all  Food Insecurity: No  Food Insecurity (12/11/2021)   Hunger Vital Sign    Worried About Running Out of Food in the Last Year: Never true    Ran Out of Food in the Last Year: Never true  Transportation Needs: No Transportation Needs (12/11/2021)   PRAPARE - Administrator, Civil Service (Medical): No    Lack of Transportation (Non-Medical): No  Physical Activity: Inactive (12/11/2021)   Exercise Vital Sign    Days of Exercise per Week: 0 days    Minutes of Exercise per Session: 0 min  Stress: No Stress Concern Present (12/11/2021)   [employer] of Occupational Health - Occupational Stress Questionnaire    Feeling of Stress : Not at all  Social Connections: Not on file   Family History  Problem Relation Age of Onset   Arthritis Mother    Colon cancer Neg Hx    Rectal cancer Neg Hx    Stomach cancer Neg Hx    Colon polyps Neg Hx    Diabetes Neg Hx    No Known  Allergies Current Outpatient Medications  Medication Sig Dispense Refill   Accu-Chek Softclix Lancets lancets Use as instructed 200 each 12   atorvastatin (LIPITOR) 20 MG tablet TAKE 1 TABLET BY MOUTH EVERY DAY (Patient taking differently: Take 20 mg by mouth daily.) 90 tablet 1   glucose blood test strip Use as instructed 200 each 12   insulin glargine (LANTUS SOLOSTAR) 100 UNIT/ML Solostar Pen Inject 55 Units into the skin daily. 20.9 mL 3   Insulin Pen Needle (PEN NEEDLES) 31G X 8 MM MISC Use this for lantus solorstar pen 100 each 1   metFORMIN (GLUCOPHAGE-XR) 750 MG 24 hr tablet Take 1 tablet (750 mg total) by mouth 2 (two) times daily. (Patient taking differently: Take 750 mg by mouth 2 (two) times daily.) 180 tablet 0   Olmesartan-amLODIPine-HCTZ (TRIBENZOR) 40-5-25 MG TABS Take 1 tablet by mouth daily. 90 tablet 3   pantoprazole (PROTONIX) 40 MG tablet Take 1 tablet (40 mg total) by mouth daily. 30 tablet 0   pioglitazone (ACTOS) 30 MG tablet TAKE 1 TABLET BY MOUTH EVERY DAY (Patient taking differently: Take 30 mg by mouth  daily.) 90 tablet 1   No current facility-administered medications for this visit.   No results found.  Review of Systems:   A ROS was performed including pertinent positives and negatives as documented in the HPI.  Physical Exam :   Constitutional: NAD and appears stated age Neurological: Alert and oriented Psych: Appropriate affect and cooperative There were no vitals taken for this visit.   Comprehensive Musculoskeletal Exam:    MIld tenderness to palpation over distal clavicle with no obvious deformity. Patient has good passive range of motion to 90 degrees flexion and abduction as well as 30 degrees of external rotation.    Equal grip strength bilaterally.  Distal neurosensory exam intact.  Imaging:   Xray (left shoulder 3 views): Nondisplaced distal clavicle fracture with minimal callus formation noted.  I personally reviewed and interpreted the radiographs.   Assessment:   72 y.o. male presenting for follow-up of a left distal clavicle fracture.  He is overall progressing well but we discussed that these fractures take time to form callus and heal.  Recommended continue using the sling when able for a few weeks however to focus on gentle active range of motion as tolerated.  Will plan to see him back in another 3 weeks to assess healing which will be about the 6-week mark from his injury.  Plan :    -Follow-up in 3 weeks for reassessment     I personally saw and evaluated the patient, and participated in the management and treatment plan.  Hazle Nordmann, PA-C Orthopedics  This document was dictated using Conservation officer, historic buildings. A reasonable attempt at proof reading has been made to minimize errors.

## 2022-10-25 ENCOUNTER — Ambulatory Visit (HOSPITAL_BASED_OUTPATIENT_CLINIC_OR_DEPARTMENT_OTHER): Payer: Medicare Other | Admitting: Student

## 2022-10-29 ENCOUNTER — Encounter: Payer: Self-pay | Admitting: Gastroenterology

## 2022-12-17 ENCOUNTER — Ambulatory Visit: Payer: Medicare Other | Admitting: Family Medicine

## 2022-12-21 ENCOUNTER — Other Ambulatory Visit: Payer: Self-pay | Admitting: Family Medicine

## 2022-12-21 ENCOUNTER — Encounter: Payer: Self-pay | Admitting: Family Medicine

## 2022-12-21 ENCOUNTER — Ambulatory Visit (INDEPENDENT_AMBULATORY_CARE_PROVIDER_SITE_OTHER): Payer: Medicare Other | Admitting: Family Medicine

## 2022-12-21 VITALS — BP 160/90 | HR 52 | Temp 98.1°F | Resp 14 | Ht 70.0 in | Wt 160.8 lb

## 2022-12-21 DIAGNOSIS — I152 Hypertension secondary to endocrine disorders: Secondary | ICD-10-CM

## 2022-12-21 DIAGNOSIS — E118 Type 2 diabetes mellitus with unspecified complications: Secondary | ICD-10-CM | POA: Diagnosis not present

## 2022-12-21 DIAGNOSIS — M1990 Unspecified osteoarthritis, unspecified site: Secondary | ICD-10-CM

## 2022-12-21 DIAGNOSIS — E119 Type 2 diabetes mellitus without complications: Secondary | ICD-10-CM

## 2022-12-21 DIAGNOSIS — J301 Allergic rhinitis due to pollen: Secondary | ICD-10-CM | POA: Diagnosis not present

## 2022-12-21 DIAGNOSIS — Z794 Long term (current) use of insulin: Secondary | ICD-10-CM | POA: Diagnosis not present

## 2022-12-21 DIAGNOSIS — E291 Testicular hypofunction: Secondary | ICD-10-CM | POA: Diagnosis not present

## 2022-12-21 DIAGNOSIS — E1121 Type 2 diabetes mellitus with diabetic nephropathy: Secondary | ICD-10-CM

## 2022-12-21 DIAGNOSIS — E1169 Type 2 diabetes mellitus with other specified complication: Secondary | ICD-10-CM

## 2022-12-21 DIAGNOSIS — E1159 Type 2 diabetes mellitus with other circulatory complications: Secondary | ICD-10-CM | POA: Diagnosis not present

## 2022-12-21 DIAGNOSIS — Z0000 Encounter for general adult medical examination without abnormal findings: Secondary | ICD-10-CM

## 2022-12-21 DIAGNOSIS — K635 Polyp of colon: Secondary | ICD-10-CM

## 2022-12-21 DIAGNOSIS — K5790 Diverticulosis of intestine, part unspecified, without perforation or abscess without bleeding: Secondary | ICD-10-CM | POA: Diagnosis not present

## 2022-12-21 LAB — CBC WITH DIFFERENTIAL/PLATELET
Basos: 1 %
Hematocrit: 51.1 % — ABNORMAL HIGH (ref 37.5–51.0)
Immature Grans (Abs): 0 10*3/uL (ref 0.0–0.1)
Lymphs: 40 %
Neutrophils Absolute: 2.7 10*3/uL (ref 1.4–7.0)
RBC: 5.88 x10E6/uL — ABNORMAL HIGH (ref 4.14–5.80)
RDW: 13.7 % (ref 11.6–15.4)
WBC: 5.9 10*3/uL (ref 3.4–10.8)

## 2022-12-21 LAB — COMPREHENSIVE METABOLIC PANEL

## 2022-12-21 LAB — POCT GLYCOSYLATED HEMOGLOBIN (HGB A1C): Hemoglobin A1C: 6.6 % — AB (ref 4.0–5.6)

## 2022-12-21 LAB — LIPID PANEL

## 2022-12-21 LAB — POCT UA - MICROALBUMIN
Albumin/Creatinine Ratio, Urine, POC: >351.3
Creatinine, POC: 85.4 mg/dL
Microalbumin Ur, POC: >300 mg/L

## 2022-12-21 MED ORDER — LANTUS SOLOSTAR 100 UNIT/ML ~~LOC~~ SOPN
55.0000 [IU] | PEN_INJECTOR | Freq: Every day | SUBCUTANEOUS | 3 refills | Status: DC
Start: 2022-12-21 — End: 2023-11-25

## 2022-12-21 MED ORDER — ATORVASTATIN CALCIUM 20 MG PO TABS: 20.0000 mg | ORAL_TABLET | Freq: Every day | ORAL | 1 refills | Status: DC

## 2022-12-21 MED ORDER — GLUCOSE BLOOD VI STRP: ORAL_STRIP | 12 refills | Status: DC

## 2022-12-21 NOTE — Progress Notes (Addendum)
Micheal Leblanc is a 72 y.o. male who presents for annual wellness visit,CPE and follow-up on chronic medical conditions.  He  Continues on Actos, metformin and   as well as Lantus insulin.  He is on 55 units.  He is also taking Tribenzor. He  does have a history of colonic polyps and will need follow-up colonoscopy.  His PHQ was slightly elevated however further discussion indicates he is really doing relatively well and is happy with his life.  He does have a history of hypogonadism but presently is on no medication.  Immunizations and Health Maintenance Immunization History  Administered Date(s) Administered   DTaP 10/20/1994, 10/15/2005   Fluad Quad(high Dose 65+) 07/15/2021, 03/31/2022   Hepatitis B 09/06/2011, 02/07/2012   Hepatitis B, ADULT 04/17/2014   PFIZER Comirnaty(Gray Top)Covid-19 Tri-Sucrose Vaccine 12/08/2020   PFIZER(Purple Top)SARS-COV-2 Vaccination 11/14/2019, 12/07/2019   Pneumococcal Conjugate-13 04/17/2014   Pneumococcal Polysaccharide-23 10/22/2015   Tdap 10/22/2015   Zoster, Live 08/26/2011   Health Maintenance Due  Topic Date Due   Zoster Vaccines- Shingrix (1 of 2) Never done   OPHTHALMOLOGY EXAM  02/11/2017   Colonoscopy  10/26/2022    Last colonoscopy: 2019 Last PSA:  Lab Results  Component Value Date   PSA 0.55 08/29/2014    Dentist:within the last year Ophtho: within the last year Exercise: none  Other doctors caring for patient include: N/a  Advanced Directives:  no  Depression screen:  See questionnaire below.        12/21/2022   10:45 AM 12/11/2021    2:01 PM 12/08/2020    9:42 AM 04/20/2016    4:17 PM 04/17/2014    4:20 PM  Depression screen PHQ 2/9  Decreased Interest 1 0 1 0 0  Down, Depressed, Hopeless 1 0 0 0 0  PHQ - 2 Score 2 0 1 0 0  Altered sleeping 1      Tired, decreased energy 1      Change in appetite 0      Feeling bad or failure about yourself  0      Moving slowly or fidgety/restless 1      Suicidal thoughts 0       PHQ-9 Score 5      Difficult doing work/chores Somewhat difficult        Fall Screen: See Questionaire below.      12/21/2022   10:49 AM 10/05/2022   11:57 AM 12/15/2021    2:18 PM 12/11/2021    2:01 PM 12/08/2020    9:42 AM  Fall Risk   Falls in the past year? 1 0 0 0 0  Number falls in past yr: 0 0 0 0 0  Injury with Fall? 1 0 0 0 0  Risk for fall due to : History of fall(s) No Fall Risks No Fall Risks Medication side effect No Fall Risks  Follow up Falls evaluation completed Falls evaluation completed Falls evaluation completed Falls evaluation completed;Education provided;Falls prevention discussed Falls evaluation completed    ADL screen:  See questionnaire below.  Functional Status Survey: Is the patient deaf or have difficulty hearing?: No Does the patient have difficulty seeing, even when wearing glasses/contacts?: No Does the patient have difficulty concentrating, remembering, or making decisions?: No Does the patient have difficulty walking or climbing stairs?: No Does the patient have difficulty dressing or bathing?: No Does the patient have difficulty doing errands alone such as visiting a doctor's office or shopping?: No   Review of Systems  Constitutional: -, -unexpected weight change, -anorexia, -fatigue Allergy: -sneezing, -itching, -congestion Dermatology: denies changing moles, rash, lumps ENT: -runny nose, -ear pain, -sore throat,  Cardiology:  -chest pain, -palpitations, -orthopnea, Respiratory: -cough, -shortness of breath, -dyspnea on exertion, -wheezing,  Gastroenterology: -abdominal pain, -nausea, -vomiting, -diarrhea, -constipation, -dysphagia Hematology: -bleeding or bruising problems Musculoskeletal: -arthralgias, -myalgias, -joint swelling, -back pain, - Ophthalmology: -vision changes,  Urology: -dysuria, -difficulty urinating,  -urinary frequency, -urgency, incontinence Neurology: -, -numbness, , -memory loss, -falls, -dizziness    PHYSICAL  EXAM:  BP (!) 160/90 (BP Location: Right Arm, Cuff Size: Normal)   Pulse (!) 52   Temp 98.1 F (36.7 C) (Oral)   Resp 14   Ht 5\' 10"  (1.778 m)   Wt 160 lb 12.8 oz (72.9 kg)   SpO2 95% Comment: room air  BMI 23.07 kg/m   General Appearance: Alert, cooperative, no distress, appears stated age Head: Normocephalic, without obvious abnormality, atraumatic Eyes: PERRL, conjunctiva/corneas clear, EOM's intact, Ears: Normal TM's and external ear canals Nose: Nares normal, mucosa normal, no drainage or sinus   tenderness Throat: Lips, mucosa, and tongue normal; teeth and gums normal Neck: Supple, no lymphadenopathy, thyroid:no enlargement/tenderness/nodules; no carotid bruit or JVD Lungs: Clear to auscultation bilaterally without wheezes, rales or ronchi; respirations unlabored Heart: Regular rate and rhythm, S1 and S2 normal, no murmur, rub or gallop Abdomen: Soft, non-tender, nondistended, normoactive bowel sounds, no masses, no hepatosplenomegaly Extremities: No clubbing, cyanosis or edema Pulses: 2+ and symmetric all extremities Skin: Skin color, texture, turgor normal, no rashes or lesions Lymph nodes: Cervical, supraclavicular, and axillary nodes normal Neurologic: CNII-XII intact, normal strength, sensation and gait; reflexes 2+ and symmetric throughout   Psych: Normal mood, affect, hygiene and grooming Hemoglobin A1c is 6.6 Microalbumin now shows nephropathy. ASSESSMENT/PLAN: Routine general medical examination at a health care facility  Hypogonadism, male - Plan: Testosterone  Hypertension complicating diabetes (HCC) - Plan: CBC with Differential/Platelet, Comprehensive metabolic panel  Hyperlipidemia associated with type 2 diabetes mellitus (HCC) - Plan: Lipid panel, atorvastatin (LIPITOR) 20 MG tablet  Diverticulosis  Controlled type 2 diabetes mellitus with complication, with long-term current use of insulin (HCC) - Plan: CBC with Differential/Platelet, Comprehensive  metabolic panel, Lipid panel, POCT UA - Microalbumin, POCT glycosylated hemoglobin (Hb A1C)  Polyp of colon, unspecified part of colon, unspecified type - Plan: Ambulatory referral to Gastroenterology  Arthritis  Allergic rhinitis due to pollen, unspecified seasonality  Type 2 diabetes mellitus without complication, with long-term current use of insulin (HCC) - Plan: insulin glargine (LANTUS SOLOSTAR) 100 UNIT/ML Solostar Pen, DISCONTINUED: glucose blood test strip  Diabetic nephropathy associated with type 2 diabetes mellitus (HCC)    Discussed Immunization recommendations discussed.  Colonoscopy recommendations reviewed.  I complemented him on the good work that he has done with him getting his weight under control.  Keep him on the same medication regimen.  Recheck here in 4 to 6 months.  Also recommended to get Shingrix and RSV.   Medicare Attestation I have personally reviewed: The patient's medical and social history Their use of alcohol, tobacco or illicit drugs Their current medications and supplements The patient's functional ability including ADLs,fall risks, home safety risks, cognitive, and hearing and visual impairment Diet and physical activities Evidence for depression or mood disorders  The patient's weight, height, and BMI have been recorded in the chart.  I have made referrals, counseling, and provided education to the patient based on review of the above and I have provided the patient with a written  personalized care plan for preventive services.     Sharlot Gowda, MD   12/24/2022

## 2022-12-21 NOTE — Telephone Encounter (Signed)
  Pharmacy comment: Alternative Requested:THE PRESCRIBED MEDICATION IS NOT COVERED BY INSURANCE. PLEASE CONSIDER CHANGING TO ONE OF THE SUGGESTED COVERED ALTERNATIVES.   All Pharmacy Suggested Alternatives:  insulin glargine (LANTUS SOLOSTAR) 100 UNIT/ML Solostar Pen insulin glargine, 1 Unit Dial, (TOUJEO SOLOSTAR) 300 UNIT/ML Solostar Pen insulin degludec (TRESIBA FLEXTOUCH) 100 UNIT/ML FlexTouch Pen

## 2022-12-22 ENCOUNTER — Other Ambulatory Visit: Payer: Self-pay

## 2022-12-22 DIAGNOSIS — E119 Type 2 diabetes mellitus without complications: Secondary | ICD-10-CM

## 2022-12-22 LAB — CBC WITH DIFFERENTIAL/PLATELET
Basophils Absolute: 0.1 10*3/uL (ref 0.0–0.2)
EOS (ABSOLUTE): 0.3 10*3/uL (ref 0.0–0.4)
Eos: 4 %
Hemoglobin: 17.2 g/dL (ref 13.0–17.7)
Immature Granulocytes: 0 %
Lymphocytes Absolute: 2.4 10*3/uL (ref 0.7–3.1)
MCH: 29.3 pg (ref 26.6–33.0)
MCHC: 33.7 g/dL (ref 31.5–35.7)
MCV: 87 fL (ref 79–97)
Monocytes Absolute: 0.5 10*3/uL (ref 0.1–0.9)
Monocytes: 8 %
Neutrophils: 47 %
Platelets: 139 10*3/uL — ABNORMAL LOW (ref 150–450)

## 2022-12-22 LAB — COMPREHENSIVE METABOLIC PANEL
Albumin: 4.4 g/dL (ref 3.8–4.8)
Alkaline Phosphatase: 101 IU/L (ref 44–121)
BUN/Creatinine Ratio: 10 (ref 10–24)
BUN: 10 mg/dL (ref 8–27)
CO2: 24 mmol/L (ref 20–29)
Chloride: 100 mmol/L (ref 96–106)
Creatinine, Ser: 1.05 mg/dL (ref 0.76–1.27)
Glucose: 65 mg/dL — ABNORMAL LOW (ref 70–99)
Potassium: 3.8 mmol/L (ref 3.5–5.2)
Total Protein: 7.2 g/dL (ref 6.0–8.5)

## 2022-12-22 LAB — LIPID PANEL
Chol/HDL Ratio: 3.6 ratio (ref 0.0–5.0)
HDL: 51 mg/dL (ref >39)
Triglycerides: 88 mg/dL (ref 0–149)

## 2022-12-22 LAB — TESTOSTERONE: Testosterone: 371 ng/dL (ref 264–916)

## 2022-12-22 MED ORDER — GLUCOSE BLOOD VI STRP
ORAL_STRIP | 12 refills | Status: AC
Start: 2022-12-22 — End: ?

## 2022-12-22 NOTE — Telephone Encounter (Signed)
Not cover by ins. Asking to change medicine per pharmacy.

## 2022-12-24 NOTE — Addendum Note (Signed)
Addended by: Ronnald Nian on: 12/24/2022 12:10 PM   Modules accepted: Level of Service

## 2023-02-09 ENCOUNTER — Ambulatory Visit: Payer: Medicare Other | Admitting: Family Medicine

## 2023-02-09 DIAGNOSIS — E291 Testicular hypofunction: Secondary | ICD-10-CM

## 2023-02-09 DIAGNOSIS — Z794 Long term (current) use of insulin: Secondary | ICD-10-CM

## 2023-02-09 DIAGNOSIS — E1121 Type 2 diabetes mellitus with diabetic nephropathy: Secondary | ICD-10-CM

## 2023-02-09 DIAGNOSIS — E1169 Type 2 diabetes mellitus with other specified complication: Secondary | ICD-10-CM

## 2023-02-09 DIAGNOSIS — I152 Hypertension secondary to endocrine disorders: Secondary | ICD-10-CM

## 2023-04-25 ENCOUNTER — Encounter: Payer: Self-pay | Admitting: Family Medicine

## 2023-04-25 ENCOUNTER — Ambulatory Visit (INDEPENDENT_AMBULATORY_CARE_PROVIDER_SITE_OTHER): Payer: Medicare Other | Admitting: Family Medicine

## 2023-04-25 VITALS — BP 134/84 | HR 80 | Temp 97.9°F | Ht 71.0 in | Wt 152.6 lb

## 2023-04-25 DIAGNOSIS — H6121 Impacted cerumen, right ear: Secondary | ICD-10-CM

## 2023-04-25 DIAGNOSIS — J069 Acute upper respiratory infection, unspecified: Secondary | ICD-10-CM

## 2023-04-25 NOTE — Progress Notes (Signed)
Chief Complaint  Patient presents with   Ear Pain    Feels like he cannot hear out of his right ear. Sat fluid was draining out of it. Last week had cough, comes and goes now. Has no energy now and no appetite.    2 weeks ago he started with a bad cough, runny nose.  Lost his taste 2 weeks ago also, hasn't returned. Thinks his smell is also diminished. Never did COVID test. Yesterday and today the cough seem better.  He woke up 2 days ago with his right ear plugged.  Everything sounds muffled. Unable to make the ear pop. He noticed the R ear felt wet when he stuck his finger in the ear yesterday, and a little bit today. He used a q-tip (not in far), and noticed yellowish drainage.  Had some fever/chills a couple of days last week, resolved. He has decreased appetite. Balance seems slightly off.  He took Mucinex DM last night and diphenhydramine  He denies any seasonal allergies.  +tobacco exposure from his son; nonsmoker.    PMH, PSH, SH reviewed  Outpatient Encounter Medications as of 04/25/2023  Medication Sig Note   Accu-Chek Softclix Lancets lancets Use as instructed    atorvastatin (LIPITOR) 20 MG tablet Take 1 tablet (20 mg total) by mouth daily.    dextromethorphan-guaiFENesin (MUCINEX DM) 30-600 MG 12hr tablet Take 1 tablet by mouth 2 (two) times daily. 04/25/2023: Took last night   diphenhydrAMINE HCl, Sleep, (ZZZQUIL PO) Take 30 mLs by mouth as needed. 04/25/2023: Took last night    glucose blood test strip Use as instructed    insulin glargine (LANTUS SOLOSTAR) 100 UNIT/ML Solostar Pen Inject 55 Units into the skin daily. (Patient taking differently: Inject 50 Units into the skin daily.)    Insulin Pen Needle (PEN NEEDLES) 31G X 8 MM MISC Use this for lantus solorstar pen    metFORMIN (GLUCOPHAGE-XR) 750 MG 24 hr tablet Take 1 tablet (750 mg total) by mouth 2 (two) times daily. (Patient taking differently: Take 750 mg by mouth 2 (two) times daily.) 07/09/2021: Pharmacy  record says 750 mg 2 times daily   Olmesartan-amLODIPine-HCTZ (TRIBENZOR) 40-5-25 MG TABS Take 1 tablet by mouth daily.    pantoprazole (PROTONIX) 40 MG tablet Take 1 tablet (40 mg total) by mouth daily.    pioglitazone (ACTOS) 30 MG tablet TAKE 1 TABLET BY MOUTH EVERY DAY (Patient taking differently: Take 30 mg by mouth daily.)    No facility-administered encounter medications on file as of 04/25/2023.   No Known Allergies   ROS: Recent URI and decreased hearing in R ear, per HPI. No n/v/d, rashes, bleeding, bruising. No HA, dizziness. Some balance issues, no vertigo. No chest pain. Had some soreness in stomach and chest from coughing so much. See HPI   PHYSICAL EXAM:  BP 134/84   Pulse 80   Temp 97.9 F (36.6 C) (Tympanic)   Ht 5\' 11"  (1.803 m)   Wt 152 lb 9.6 oz (69.2 kg)   BMI 21.28 kg/m   Room smells of cigarette smoke Occasional deep, wet-sounding cough. Pleasant male, in no distress HEENT: conjunctiva and sclera are clear, EOMI. Able to smell soap on my hands Nasal mucosa mod-severely edematous on the right, normal on the left. No purulence. Sinuses are nontender. Right TM obscured by cerumen. After ear lavage, TM normal, and reported hearing improved.  Some erythema of the superior canal (likely related to the procedure).  No pain with movement of external ear.  Left TM and EAC normal. OP trace erythema posteriorly Neck: No lymphadenopathy or mass Heart: regular rate and rhythm Lungs: clear bilaterally Extremities: no edema Neuro: alert and oriented, cranial nerves grossly intact, normal gait Psych: normal mood, affect, hygiene and grooming    ASSESSMENT/PLAN:   Impacted cerumen of right ear - resolved with ear lavage. Hearing improved. Tolerated procedure well, denied any ear pain  Viral upper respiratory tract infection - likely had COVID.  Has some smell, taste remains diminished. Cont supportive care for cough (Mucinex DM)   Continue the mucinex DM as  directed. Try sinus rinses (sinus rinse kit or neti-pot) twice daily with either boiled water or distilled water, NOT tap water). Avoid decongestants, as this can raise your blood pressure. Return for re-evaluation if your ear becomes painful, if you have any fever, if your cough doesn't resolve, or any other concerns.

## 2023-04-25 NOTE — Patient Instructions (Signed)
Continue the mucinex DM as directed. Try sinus rinses (sinus rinse kit or neti-pot) twice daily with either boiled water or distilled water, NOT tap water). Avoid decongestants, as this can raise your blood pressure. Return for re-evaluation if your ear becomes painful, if you have any fever, if your cough doesn't resolve, or any other concerns.

## 2023-04-26 ENCOUNTER — Ambulatory Visit: Payer: Medicare Other | Admitting: Medical

## 2023-05-02 ENCOUNTER — Emergency Department (HOSPITAL_COMMUNITY): Payer: Medicare Other

## 2023-05-02 ENCOUNTER — Emergency Department (HOSPITAL_COMMUNITY)
Admission: EM | Admit: 2023-05-02 | Discharge: 2023-05-02 | Disposition: A | Discharge status: Home / Self Care | Payer: Medicare Other | Attending: Emergency Medicine | Admitting: Emergency Medicine

## 2023-05-02 ENCOUNTER — Other Ambulatory Visit: Payer: Self-pay

## 2023-05-02 DIAGNOSIS — Z7984 Long term (current) use of oral hypoglycemic drugs: Secondary | ICD-10-CM | POA: Insufficient documentation

## 2023-05-02 DIAGNOSIS — E119 Type 2 diabetes mellitus without complications: Secondary | ICD-10-CM | POA: Diagnosis not present

## 2023-05-02 DIAGNOSIS — Z79899 Other long term (current) drug therapy: Secondary | ICD-10-CM | POA: Insufficient documentation

## 2023-05-02 DIAGNOSIS — H9191 Unspecified hearing loss, right ear: Secondary | ICD-10-CM | POA: Insufficient documentation

## 2023-05-02 DIAGNOSIS — R519 Headache, unspecified: Secondary | ICD-10-CM | POA: Insufficient documentation

## 2023-05-02 DIAGNOSIS — Z794 Long term (current) use of insulin: Secondary | ICD-10-CM | POA: Insufficient documentation

## 2023-05-02 DIAGNOSIS — R634 Abnormal weight loss: Secondary | ICD-10-CM | POA: Diagnosis not present

## 2023-05-02 DIAGNOSIS — R9089 Other abnormal findings on diagnostic imaging of central nervous system: Secondary | ICD-10-CM | POA: Diagnosis not present

## 2023-05-02 DIAGNOSIS — R531 Weakness: Secondary | ICD-10-CM | POA: Diagnosis not present

## 2023-05-02 DIAGNOSIS — I10 Essential (primary) hypertension: Secondary | ICD-10-CM | POA: Insufficient documentation

## 2023-05-02 DIAGNOSIS — R29818 Other symptoms and signs involving the nervous system: Secondary | ICD-10-CM | POA: Diagnosis not present

## 2023-05-02 LAB — BASIC METABOLIC PANEL
Anion gap: 11 (ref 5–15)
BUN: 14 mg/dL (ref 8–23)
CO2: 23 mmol/L (ref 22–32)
Calcium: 10 mg/dL (ref 8.9–10.3)
Chloride: 109 mmol/L (ref 98–111)
Creatinine, Ser: 1.05 mg/dL (ref 0.61–1.24)
GFR, Estimated: >60 mL/min (ref >60)
Glucose, Bld: 132 mg/dL — ABNORMAL HIGH (ref 70–99)
Potassium: 4.2 mmol/L (ref 3.5–5.1)
Sodium: 143 mmol/L (ref 135–145)

## 2023-05-02 LAB — CBC
HCT: 50.7 % (ref 39.0–52.0)
Hemoglobin: 16.4 g/dL (ref 13.0–17.0)
MCH: 28.6 pg (ref 26.0–34.0)
MCHC: 32.3 g/dL (ref 30.0–36.0)
MCV: 88.5 fL (ref 80.0–100.0)
Platelets: 152 10*3/uL (ref 150–400)
RBC: 5.73 MIL/uL (ref 4.22–5.81)
RDW: 12.7 % (ref 11.5–15.5)
WBC: 5.3 10*3/uL (ref 4.0–10.5)
nRBC: 0 % (ref 0.0–0.2)

## 2023-05-02 LAB — TSH: TSH: 3.432 u[IU]/mL (ref 0.350–4.500)

## 2023-05-02 MED ORDER — AMOXICILLIN 500 MG PO CAPS
500.0000 mg | ORAL_CAPSULE | Freq: Three times a day (TID) | ORAL | 0 refills | Status: DC
Start: 2023-05-02 — End: 2023-05-17

## 2023-05-02 NOTE — Discharge Instructions (Addendum)
There is some fluid behind the ear.  Will treat with antibiotics.  The MRI showed nonspecific finding and potentially could have been an old stroke.  Follow-up with neurology.

## 2023-05-02 NOTE — ED Provider Notes (Signed)
[place] EMERGENCY DEPARTMENT AT [facility] Provider Note   CSN: [id number] Arrival date & time: 05/02/23  1211     History  Chief Complaint  Patient presents with   Hearing Problem    Micheal Leblanc is a 72 y.o. male.  HPI Patient presents with difficulty hearing out of his right ear.  Has had for some days now.  Saw PCP and had cerumen removed.  Reviewing notes it appears she was doing better but states it is returned.  No pain in the ear.  No drainage.  No headache.  Patient also states he is feeling weak.  Having difficulty walking at times.  States he feels "light".  This has been going for a while.  Also states he has been losing weight without trying.  States has not had much of an appetite.  No coughing.  No blood in the stool.  Does not appear to talk to his PCP about this.  No headaches.   Past Medical History:  Diagnosis Date   Allergy    Diabetes mellitus    ED (erectile dysfunction)    Hx of colonic polyps    Hyperlipidemia    Hypertension     Home Medications Prior to Admission medications   Medication Sig Start Date End Date Taking? Authorizing Provider  amoxicillin (AMOXIL) 500 MG capsule Take 1 capsule (500 mg total) by mouth 3 (three) times daily. 05/02/23  Yes Benjiman Core, MD  Accu-Chek Softclix Lancets lancets Use as instructed 03/30/21   Ronnald Nian, MD  atorvastatin (LIPITOR) 20 MG tablet Take 1 tablet (20 mg total) by mouth daily. 12/21/22   Ronnald Nian, MD  dextromethorphan-guaiFENesin [facility] DM) 30-600 MG 12hr tablet Take 1 tablet by mouth 2 (two) times daily.    [provider]  diphenhydrAMINE HCl, Sleep, (ZZZQUIL PO) Take 30 mLs by mouth as needed.    [provider]  glucose blood test strip Use as instructed 12/22/22   Ronnald Nian, MD  insulin glargine (LANTUS SOLOSTAR) 100 UNIT/ML Solostar Pen Inject 55 Units into the skin daily. Patient taking differently: Inject 50 Units into the skin daily.  12/21/22 05/02/23  Ronnald Nian, MD  Insulin Pen Needle (PEN NEEDLES) 31G X 8 MM MISC Use this for lantus solorstar pen 12/12/18   Wieters, [employer], PA-C  metFORMIN (GLUCOPHAGE-XR) 750 MG 24 hr tablet Take 1 tablet (750 mg total) by mouth 2 (two) times daily. Patient taking differently: Take 750 mg by mouth 2 (two) times daily. 12/12/20   Ronnald Nian, MD  Olmesartan-amLODIPine-HCTZ [facility]) 40-5-25 MG TABS Take 1 tablet by mouth daily. 12/08/20   Ronnald Nian, MD  pantoprazole (PROTONIX) 40 MG tablet Take 1 tablet (40 mg total) by mouth daily. 07/11/21 04/25/23  Pokhrel, Rebekah [place], MD  pioglitazone (ACTOS) 30 MG tablet TAKE 1 TABLET BY MOUTH EVERY DAY Patient taking differently: Take 30 mg by mouth daily. 06/02/21   Ronnald Nian, MD      Allergies    Patient has no known allergies.    Review of Systems   Review of Systems  Physical Exam Updated Vital Signs BP (!) 196/105   Pulse (!) 44   Temp 97.8 F (36.6 C) (Oral)   Resp 17   SpO2 92%  Physical Exam Vitals and nursing note reviewed.  Constitutional:      Appearance: Normal appearance.  HENT:     Left Ear: Tympanic membrane normal.     Ears:  Comments: Slight erythema on right TM without bulging or fluid.  External canal reassuring. Eyes:     Extraocular Movements: Extraocular movements intact.     Pupils: Pupils are equal, round, and reactive to light.  Cardiovascular:     Rate and Rhythm: Regular rhythm.  Neurological:     Mental Status: He is alert.     Comments: Finger-nose intact.  After previous injury.  Some difficulty with left shoulder pain heel shin intact.  Good grip strength bilateral upper extremities.  Good straight leg raise bilaterally.  No Romberg.  Gait reassuring.     ED Results / Procedures / Treatments   Labs (all labs ordered are listed, but only abnormal results are displayed) Labs Reviewed  BASIC METABOLIC PANEL - Abnormal; Notable for the following components:      Result Value    Glucose, Bld 132 (*)    All other components within normal limits  CBC  TSH    EKG None  Radiology MR BRAIN WO CONTRAST  Result Date: 05/02/2023 CLINICAL DATA:  Headache EXAM: MRI HEAD WITHOUT CONTRAST TECHNIQUE: Multiplanar, multiecho pulse sequences of the brain and surrounding structures were obtained without intravenous contrast. COMPARISON:  None Available. FINDINGS: Brain: Hyperintense T2-weighted signal lesion with T2 shine through on diffusion-weighted imaging in the right frontal white matter. No acute or chronic hemorrhage. There is multifocal hyperintense T2-weighted signal within the white matter. Generalized volume loss. The midline structures are normal. Vascular: Normal flow voids. Skull and upper cervical spine: Normal calvarium and skull base. Visualized upper cervical spine and soft tissues are normal. Sinuses/Orbits:Right mastoid effusion. Paranasal sinuses are clear. Normal orbits. IMPRESSION: 1. No acute intracranial abnormality. 2. Hyperintense T2-weighted signal lesion of the right frontal white matter without diffusion restriction, favored to be a subacute infarct. 3. Right mastoid effusion. Electronically Signed   By: Deatra Robinson M.D.   On: 05/02/2023 22:38   CT HEAD WO CONTRAST ( )  Result Date: 05/02/2023 CLINICAL DATA:  Headache, neuro deficit EXAM: CT HEAD WITHOUT CONTRAST TECHNIQUE: Contiguous axial images were obtained from the base of the skull through the vertex without intravenous contrast. RADIATION DOSE REDUCTION: This exam was performed according to the departmental dose-optimization program which includes automated exposure control, adjustment of the mA and/or kV according to patient size and/or use of iterative reconstruction technique. COMPARISON:  None Available. FINDINGS: Brain: Focal hypodensities in the right basal ganglia (series 2, images 16 and 17), which are technically age indeterminate. No evidence of acute cortical infarction, hemorrhage, mass,  mass effect, or midline shift. No hydrocephalus or extra-axial fluid collection. Periventricular white matter changes, likely the sequela of chronic small vessel ischemic disease. Vascular: No hyperdense vessel. Skull: Negative for fracture or focal lesion. Sinuses/Orbits: Mucosal thickening in the ethmoid air cells. No acute finding in the orbits. Other: Fluid in the right mastoid air cells and middle ear. IMPRESSION: 1. Focal hypodensities in the right basal ganglia, which are technically age indeterminate. If there is clinical concern for acute infarct, consider MRI for further evaluation. 2. Fluid in the right mastoid air cells and middle ear. Electronically Signed   By: Wiliam Ke M.D.   On: 05/02/2023 19:51   DG Chest 2 View  Result Date: 05/02/2023 CLINICAL DATA:  Weakness, weight loss. EXAM: CHEST - 2 VIEW COMPARISON:  January 22, 2020. FINDINGS: The heart size and mediastinal contours are within normal limits. Both lungs are clear. The visualized skeletal structures are unremarkable. IMPRESSION: No active cardiopulmonary disease. Electronically Signed  By: Lupita Raider M.D.   On: 05/02/2023 18:38    Procedures Procedures    Medications Ordered in ED Medications - No data to display  ED Course/ Medical Decision Making/ A&P                                 Medical Decision Making Amount and/or Complexity of Data Reviewed Labs: ordered. Radiology: ordered.  Risk Prescription drug management.   Patient with difficulty hearing on the right ear.  Mild erythema.  No infection seen.  Potentially could be allergic.  Will treat as such.  Reviewed previous PCP note.  Also feeling somewhat weak.  General.  Not lateralizing.  However has also been losing weight without trying.  Differential diagnosis does include chronic stroke but also causes such as malignancy.  Basic blood work overall reassuring.  Will get TSH added on to get head CT and chest x-ray.  Head CT had nonspecific change  for potential basal ganglia infarct of unknown age.  Discussed with patient and ordered MRI.  MRI shows potentially subacute stroke although I discussed with neurology, Dr. Ezzie Dural.  Not convinced this is necessarily a stroke.  Can follow-up with neurology as an outpatient.  However does have mastoid effusion.  Doubt mastoiditis but will treat with antibiotics due to the hearing loss.       Final Clinical Impression(s) / ED Diagnoses Final diagnoses:  Hearing loss of right ear, unspecified hearing loss type    Rx / DC Orders ED Discharge Orders          Ordered    amoxicillin (AMOXIL) 500 MG capsule  3 times daily        05/02/23 2255              Benjiman Core, MD 05/02/23 2318

## 2023-05-02 NOTE — ED Notes (Signed)
Pt return from MRI, placed back on monitor, no distress noted

## 2023-05-02 NOTE — ED Notes (Signed)
Patient s/p CT scan

## 2023-05-02 NOTE — ED Notes (Signed)
Made RN aware pt was not on the monitor.

## 2023-05-02 NOTE — ED Triage Notes (Signed)
Patient reports hearing loss to R ear, feeling like it is full of water. Has been seen by PCP for same and had the ear flushed out but has still had no relief. Also states he doesn't feel like his legs are working correctly-over one week ago he had a fall coming down some stairs and continues to feel uncoordinated.

## 2023-05-02 NOTE — ED Notes (Signed)
Pt's BP extremely elevated, N. Pickering MD notified

## 2023-05-03 ENCOUNTER — Ambulatory Visit: Payer: Medicare Other | Admitting: Family Medicine

## 2023-05-03 ENCOUNTER — Telehealth: Payer: Self-pay

## 2023-05-03 NOTE — Transitions of Care (Post Inpatient/ED Visit) (Unsigned)
   05/03/2023  Name: Micheal Leblanc MRN: [id number] DOB: 04/08/1951  Today's TOC FU Call Status: Today's TOC FU Call Status:: Unsuccessful Call (1st Attempt) Unsuccessful Call (1st Attempt) Date: 05/03/23  Attempted to reach the patient regarding the most recent Inpatient/ED visit.  Follow Up Plan: Additional outreach attempts will be made to reach the patient to complete the Transitions of Care (Post Inpatient/ED visit) call.   Signature Karena Addison, LPN [facility] Nurse Health Advisor Direct Dial [phone]

## 2023-05-04 ENCOUNTER — Encounter: Payer: Self-pay | Admitting: Family Medicine

## 2023-05-04 ENCOUNTER — Ambulatory Visit: Payer: Medicare Other | Admitting: Family Medicine

## 2023-05-04 VITALS — BP 136/84 | HR 91 | Wt 148.4 lb

## 2023-05-04 DIAGNOSIS — R42 Dizziness and giddiness: Secondary | ICD-10-CM | POA: Diagnosis not present

## 2023-05-04 DIAGNOSIS — H6591 Unspecified nonsuppurative otitis media, right ear: Secondary | ICD-10-CM

## 2023-05-04 DIAGNOSIS — Z23 Encounter for immunization: Secondary | ICD-10-CM | POA: Diagnosis not present

## 2023-05-04 NOTE — Progress Notes (Signed)
   Subjective:    Patient ID: Micheal Leblanc, male    DOB: 1951-04-24, 72 y.o.   MRN: [id number]  HPI He is here for a recheck after recent emergency room visit for evaluation of right ear discomfort.  He was also having trouble with dizziness.  He did have a CT scan as well as an MRI because of potential for stroke.  There was some question listed about possible old stroke however reviewing it further and indicates this is probably not the case.  His main concern is decreased hearing and dizziness.  He gets dizzy when he looks up and also when he walks.  Their diagnosis would based on the scans was mastoid fluid level as well as middle ear fluid level and they placed him on amoxicillin.  He has been unable to work the whole week and apparently did miss some time last week as well.   Review of Systems     Objective:    Physical Exam Alert and in no distress.  Both tympanic membranes and canals are normal.  He does complain of dizziness when he looks up.  EOMI. The emergency room record including CT and MRI as well as blood work was evaluated.      Assessment & Plan:  Fluid level behind tympanic membrane of right ear  Need for COVID-19 vaccine - Plan: Pfizer Comirnaty Covid -19 Vaccine 66yrs and older  Need for influenza vaccination - Plan: Flu Vaccine Trivalent High Dose (Fluad), CANCELED: Flu vaccine trivalent PF, 6mos and older(Flulaval,Afluria,Fluarix,Fluzone)  Dizziness  He will continue on Amoxil and I will give him a note to get out of work for the rest of the week.  I will see him again in roughly 2 weeks and reevaluate.  Hopefully this all will clear up with the use of the antibiotic however continued difficulty with dizziness might require further evaluation and possible referral.

## 2023-05-04 NOTE — Patient Instructions (Addendum)
Finish out the antibiotic

## 2023-05-05 NOTE — Transitions of Care (Post Inpatient/ED Visit) (Signed)
   05/05/2023  Name: Micheal Leblanc MRN: [id number] DOB: 03-01-1951  Today's TOC FU Call Status: Today's TOC FU Call Status:: Successful TOC FU Call Completed Unsuccessful Call (1st Attempt) Date: 05/03/23 [facility] FU Call Complete Date: 05/05/23  Attempted to reach the patient regarding the most recent Inpatient/ED visit.  Follow Up Plan: No further outreach attempts will be made at this time. We have been unable to contact the patient. Patient already seen in office Signature Karena Addison, LPN [facility] Nurse Health Advisor Direct Dial [phone]

## 2023-05-09 ENCOUNTER — Encounter: Payer: Self-pay | Admitting: Medical

## 2023-05-09 ENCOUNTER — Ambulatory Visit (INDEPENDENT_AMBULATORY_CARE_PROVIDER_SITE_OTHER): Payer: Medicare Other | Admitting: Medical

## 2023-05-09 VITALS — BP 130/86 | HR 85 | Wt 147.8 lb

## 2023-05-09 DIAGNOSIS — H938X1 Other specified disorders of right ear: Secondary | ICD-10-CM | POA: Diagnosis not present

## 2023-05-09 DIAGNOSIS — R634 Abnormal weight loss: Secondary | ICD-10-CM | POA: Diagnosis not present

## 2023-05-09 DIAGNOSIS — R42 Dizziness and giddiness: Secondary | ICD-10-CM | POA: Diagnosis not present

## 2023-05-09 DIAGNOSIS — H6591 Unspecified nonsuppurative otitis media, right ear: Secondary | ICD-10-CM

## 2023-05-09 MED ORDER — PSEUDOEPHEDRINE HCL 30 MG PO TABS: 30.0000 mg | ORAL_TABLET | Freq: Two times a day (BID) | ORAL | 0 refills | Status: DC

## 2023-05-09 NOTE — Progress Notes (Signed)
Subjective:  Micheal Leblanc is a 72 y.o. male who presents for Chief Complaint  Patient presents with   Dizziness    Comes and goes. Right side still clogged. Walks lightly, afraid he is going to fall.      Here for concerns.  Balance feels off, ears clogged.  Saw emergency dept 05/02/23 and Micheal Leblanc here for same 05/04/23.   Been using amoxicillin for ear fluid/mastoid fluid but not much improved.  Still feels off balance walking, ear pressure and decreased hearing on the right.   No fever, no cough, no sore throat.    No numbness, tingling, weakness, confusion or vision changes.    No chest pain, palpations, no LOC.    He notes weight loss unintentional. Eats typically 1.5 x per day, decrease appetite but this is chronic.  No change in bowels.  No blood in urine or stool.  No fever.  No night sweats.  No trouble breathing.  Nonsmoker.   Drinks on average a beer daily.  He feels he has been losing weight gradually over this year.  No other aggravating or relieving factors.    No other c/o.  Past Medical History:  Diagnosis Date   Allergy    Diabetes mellitus    ED (erectile dysfunction)    Hx of colonic polyps    Hyperlipidemia    Hypertension    Current Outpatient Medications on File Prior to Visit  Medication Sig Dispense Refill   Accu-Chek Softclix Lancets lancets Use as instructed 200 each 12   amoxicillin (AMOXIL) 500 MG capsule Take 1 capsule (500 mg total) by mouth 3 (three) times daily. 21 capsule 0   atorvastatin (LIPITOR) 20 MG tablet Take 1 tablet (20 mg total) by mouth daily. 90 tablet 1   dextromethorphan-guaiFENesin (MUCINEX DM) 30-600 MG 12hr tablet Take 1 tablet by mouth 2 (two) times daily.     glucose blood test strip Use as instructed 200 each 12   Insulin Pen Needle (PEN NEEDLES) 31G X 8 MM MISC Use this for lantus solorstar pen 100 each 1   metFORMIN (GLUCOPHAGE-XR) 750 MG 24 hr tablet Take 1 tablet (750 mg total) by mouth 2 (two) times daily. (Patient taking  differently: Take 750 mg by mouth 2 (two) times daily.) 180 tablet 0   Olmesartan-amLODIPine-HCTZ (TRIBENZOR) 40-5-25 MG TABS Take 1 tablet by mouth daily. 90 tablet 3   pioglitazone (ACTOS) 30 MG tablet TAKE 1 TABLET BY MOUTH EVERY DAY (Patient taking differently: Take 30 mg by mouth daily.) 90 tablet 1   diphenhydrAMINE HCl, Sleep, (ZZZQUIL PO) Take 30 mLs by mouth as needed. (Patient not taking: Reported on 05/09/2023)     insulin glargine (LANTUS SOLOSTAR) 100 UNIT/ML Solostar Pen Inject 55 Units into the skin daily. (Patient taking differently: Inject 50 Units into the skin daily.) 15 mL 3   pantoprazole (PROTONIX) 40 MG tablet Take 1 tablet (40 mg total) by mouth daily. 30 tablet 0   No current facility-administered medications on file prior to visit.     The following portions of the patient's history were reviewed and updated as appropriate: allergies, current medications, past family history, past medical history, past social history, past surgical history and problem list.  ROS Otherwise as in subjective above    Objective: BP 130/86   Pulse 85   Wt 147 lb 12.8 oz (67 kg)   SpO2 95%   BMI 20.61 kg/m   Wt Readings from Last 3 Encounters:  05/09/23 147  lb 12.8 oz (67 kg)  05/04/23 148 lb 6.4 oz (67.3 kg)  04/25/23 152 lb 9.6 oz (69.2 kg)   Wt Readings from Last 3 Encounters:  05/09/23 147 lb 12.8 oz (67 kg)  05/04/23 148 lb 6.4 oz (67.3 kg)  04/25/23 152 lb 9.6 oz (69.2 kg)   General appearance: alert, no distress, well developed, well nourished HEENT: normocephalic, sclerae anicteric, conjunctiva pink and moist, right TM with air-fluid level still, left TM normal, nares patent, no discharge or erythema, pharynx normal Oral cavity: MMM, no lesions Neck: supple, no lymphadenopathy, no thyromegaly, no masses Heart: RRR, normal S1, S2, no murmurs Lungs: CTA bilaterally, no wheezes, rhonchi, or rales Abdomen: +bs, soft, non tender, non distended, no masses, no  hepatomegaly, no splenomegaly Pulses: 2+ radial pulses, 2+ pedal pulses, normal cap refill Ext: no edema Neuro: Unsteady on feet with eyes closed, CN II through XII intact, otherwise nonfocal exam Psych: Pleasant, answers questions appropriately   Assessment: Encounter Diagnoses  Name Primary?   Fluid level behind tympanic membrane of right ear Yes   Ear pressure, right    Dizziness    Weight loss      Plan: Ear pressure, dizziness, air-fluid levels-I reviewed his recent visit notes with Micheal Leblanc here as well as the emergency department..  Given his ongoing symptoms we will add short-term low-dose decongestant.  He declines nasal sprays.  Finish out antibiotic.  Tentative referral to ENT placed as well  We discussed his unintentional weight loss.  We discussed possible causes.  I will order CT chest abdomen pelvis.  Additional labs as below.  I reviewed other labs in the chart record from 05/02/2023 emergency department visit.  He is also due for colonoscopy.  We would defer this till after the scans   Micheal Leblanc was seen today for dizziness.  Diagnoses and all orders for this visit:  Fluid level behind tympanic membrane of right ear -     Ambulatory referral to ENT  Ear pressure, right -     Ambulatory referral to ENT  Dizziness -     Ambulatory referral to ENT  Weight loss -     Hepatic function panel -     Lipase -     PSA -     CT CHEST ABDOMEN PELVIS W CONTRAST; Future  Other orders -     pseudoephedrine (SUDAFED) 30 MG tablet; Take 1 tablet (30 mg total) by mouth in the morning and at bedtime.    Follow up: pending labs, scans

## 2023-05-10 LAB — HEPATIC FUNCTION PANEL
ALT: 22 [IU]/L (ref 0–44)
AST: 21 [IU]/L (ref 0–40)
Albumin: 4.3 g/dL (ref 3.8–4.8)
Alkaline Phosphatase: 95 [IU]/L (ref 44–121)
Bilirubin Total: 0.5 mg/dL (ref 0.0–1.2)
Bilirubin, Direct: 0.24 mg/dL (ref 0.00–0.40)
Total Protein: 6.9 g/dL (ref 6.0–8.5)

## 2023-05-10 LAB — LIPASE: Lipase: 31 U/L (ref 13–78)

## 2023-05-10 LAB — PSA: Prostate Specific Ag, Serum: 2.8 ng/mL (ref 0.0–4.0)

## 2023-05-10 NOTE — Progress Notes (Signed)
Liver test normal, pancreas test normal, PSA 2.8 which is reasonable.  Expect a phone call about doing the CT chest abdomen pelvis

## 2023-05-12 ENCOUNTER — Telehealth: Payer: Self-pay | Admitting: Family Medicine

## 2023-05-12 NOTE — Telephone Encounter (Signed)
Micheal Leblanc called to update you, he is not feeling better and he doesn't trust his balance.

## 2023-05-16 NOTE — Telephone Encounter (Signed)
Called pt, no answer, left voicemail.

## 2023-05-16 NOTE — Telephone Encounter (Signed)
Pt called again. Pt is still off balance and not feeling better. Pt is supposed to go back to work for the second shift.

## 2023-05-17 ENCOUNTER — Encounter: Payer: Self-pay | Admitting: Medical

## 2023-05-17 ENCOUNTER — Ambulatory Visit (INDEPENDENT_AMBULATORY_CARE_PROVIDER_SITE_OTHER): Payer: Medicare Other | Admitting: Medical

## 2023-05-17 ENCOUNTER — Telehealth: Payer: Self-pay | Admitting: Family Medicine

## 2023-05-17 VITALS — BP 130/82 | HR 65 | Temp 97.5°F | Wt 151.2 lb

## 2023-05-17 DIAGNOSIS — R42 Dizziness and giddiness: Secondary | ICD-10-CM | POA: Diagnosis not present

## 2023-05-17 DIAGNOSIS — H9191 Unspecified hearing loss, right ear: Secondary | ICD-10-CM

## 2023-05-17 DIAGNOSIS — H938X1 Other specified disorders of right ear: Secondary | ICD-10-CM

## 2023-05-17 DIAGNOSIS — R634 Abnormal weight loss: Secondary | ICD-10-CM | POA: Diagnosis not present

## 2023-05-17 MED ORDER — MECLIZINE HCL 25 MG PO TABS: 25.0000 mg | ORAL_TABLET | Freq: Two times a day (BID) | ORAL | 0 refills | Status: DC

## 2023-05-17 MED ORDER — AMOXICILLIN-POT CLAVULANATE 875-125 MG PO TABS: 1.0000 | ORAL_TABLET | Freq: Two times a day (BID) | ORAL | 0 refills | Status: DC

## 2023-05-17 NOTE — Progress Notes (Addendum)
Subjective:  Micheal Leblanc is a 72 y.o. male who presents for Chief Complaint  Patient presents with   no better    Still having fluid behind ear. Still off balance and has to walk slow or he feels like he will fall     Here for recheck.  I saw him 8 days ago for dizziness, off-balance, pressure in the head, fluid behind the ears.  Last visit he did a trial of low-dose Sudafed, he declined nasal sprays, he finished out antibiotic amoxicillin that was prescribed by the emergency department on 05/02/2023.   Feels some better but still heavy in right ear, off balance.   Last visit we made a tentative referral to ENT.  He has not heard back from that referral yet.    Last visit he had complaints of weight loss unintentionally.  CT scan has been ordered.  He has appt for next week for this.     From last visit notes about a week ago,  he was noting balance feels off, ears clogged.  Saw emergency dept 05/02/23 and Dr. Susann Givens here for same 05/04/23.   Been using amoxicillin for ear fluid/mastoid fluid but not much improved.  Still feels off balance walking, ear pressure and decreased hearing on the right.   No fever, no cough, no sore throat.    No numbness, tingling, weakness, confusion or vision changes.    No chest pain, palpations, no LOC.    He notes weight loss unintentional. Eats typically 1.5 x per day, decrease appetite but this is chronic.  No change in bowels.  No blood in urine or stool.  No fever.  No night sweats.  No trouble breathing.  Nonsmoker.   Drinks on average a beer daily.  He feels he has been losing weight gradually over this year.   No other aggravating or relieving factors.    No other c/o.  Past Medical History:  Diagnosis Date   Allergy    Diabetes mellitus    ED (erectile dysfunction)    Hx of colonic polyps    Hyperlipidemia    Hypertension    Current Outpatient Medications on File Prior to Visit  Medication Sig Dispense Refill   glucose blood test strip  Use as instructed 200 each 12   Insulin Pen Needle (PEN NEEDLES) 31G X 8 MM MISC Use this for lantus solorstar pen 100 each 1   metFORMIN (GLUCOPHAGE-XR) 750 MG 24 hr tablet Take 1 tablet (750 mg total) by mouth 2 (two) times daily. (Patient taking differently: Take 750 mg by mouth 2 (two) times daily.) 180 tablet 0   Olmesartan-amLODIPine-HCTZ (TRIBENZOR) 40-5-25 MG TABS Take 1 tablet by mouth daily. 90 tablet 3   pioglitazone (ACTOS) 30 MG tablet TAKE 1 TABLET BY MOUTH EVERY DAY (Patient taking differently: Take 30 mg by mouth daily.) 90 tablet 1   Accu-Chek Softclix Lancets lancets Use as instructed 200 each 12   atorvastatin (LIPITOR) 20 MG tablet Take 1 tablet (20 mg total) by mouth daily. 90 tablet 1   insulin glargine (LANTUS SOLOSTAR) 100 UNIT/ML Solostar Pen Inject 55 Units into the skin daily. (Patient taking differently: Inject 50 Units into the skin daily.) 15 mL 3   pantoprazole (PROTONIX) 40 MG tablet Take 1 tablet (40 mg total) by mouth daily. 30 tablet 0   pseudoephedrine (SUDAFED) 30 MG tablet Take 1 tablet (30 mg total) by mouth in the morning and at bedtime. (Patient not taking: Reported on 05/17/2023) 30  tablet 0   No current facility-administered medications on file prior to visit.     The following portions of the patient's history were reviewed and updated as appropriate: allergies, current medications, past family history, past medical history, past social history, past surgical history and problem list.  ROS Otherwise as in subjective above    Objective: BP 130/82   Pulse 65   Temp (!) 97.5 F (36.4 C)   Wt 151 lb 3.2 oz (68.6 kg)   SpO2 97%   BMI 21.09 kg/m   Wt Readings from Last 3 Encounters:  05/17/23 151 lb 3.2 oz (68.6 kg)  05/09/23 147 lb 12.8 oz (67 kg)  05/04/23 148 lb 6.4 oz (67.3 kg)   BP Readings from Last 3 Encounters:  05/17/23 130/82  05/09/23 130/86  05/04/23 136/84    General appearance: alert, no distress, well developed, well  nourished HEENT: normocephalic, sclerae anicteric, conjunctiva pink and moist, right TM with air-fluid level still, left TM normal, nares patent, no discharge or erythema, pharynx normal Oral cavity: MMM, no lesions Neck: supple, no lymphadenopathy, no thyromegaly, no masses Heart: RRR, normal S1, S2, no murmurs Lungs: CTA bilaterally, no wheezes, rhonchi, or rales Abdomen: +bs, soft, non tender, non distended, no masses, no hepatomegaly, no splenomegaly Pulses: 2+ radial pulses, 2+ pedal pulses, normal cap refill Ext: no edema Neuro: Unsteady on feet with eyes closed, CN II through XII intact, otherwise nonfocal exam Psych: Pleasant, answers questions appropriately   Assessment: Encounter Diagnoses  Name Primary?   Ear pressure, right Yes   Decreased hearing of right ear    Dizziness    Unintentional weight loss       Plan: After reviewing all the information today apparently he has only been taking his antibiotic once a day although it was prescribed 3 times a day.  Patient Instructions  Your right ear still looks a little abnormal and you still have some symptoms  Recommendations: Go ahead and stop the amoxicillin antibiotic and lets change to Augmentin which is a little stronger Use Augmentin antibiotic twice daily for 5 to 7 days Make sure you are drinking at least 100 or more ounces of water or clear fluids over the next few days Begin meclizine twice a day to help with congestion and dizziness.  This can cause a little bit of drowsiness.  If you get really drowsy with it then you can just use a half a tablet at a time If you have some Sudafed left 30 mg from last visit use this twice a day for the next 3 to 5 days along with the recommendations above Expect a phone call about the ENT consult ear nose and throat specialist.  I will check on this referral again today Go as planned next week for the CT chest/abdomen/pelvis to evaluate for weight loss unexplained. If desired,  you can also use Afrin nasal spray over the counter twice daily for no more than 3 days at a time.  This is a topical decongestant that can open up the pressure in the ears /sinus. Double check your medications at home that you are not out of refills.  Also bring your pill bottles the next time you come in particular on the physical so we can verify that you are taking what we have on file    We discussed his unintentional weight loss.  We discussed possible causes.  Go for CT chest abdomen pelvis next week as planned  He is also due  for colonoscopy.  We would defer this till after the scans   [place]e was seen today for no better.  Diagnoses and all orders for this visit:  Ear pressure, right  Decreased hearing of right ear  Dizziness  Unintentional weight loss  Other orders -     amoxicillin-clavulanate (AUGMENTIN) 875-125 MG tablet; Take 1 tablet by mouth 2 (two) times daily. -     meclizine (ANTIVERT) 25 MG tablet; Take 1 tablet (25 mg total) by mouth 2 (two) times daily.     Follow up: pending scans

## 2023-05-17 NOTE — Telephone Encounter (Signed)
Pt dropped off FMLA forms, (return to [facility] when completed)  Please call [place] [phone] Jomarie Longs, son)  I am sending back in Dr Jola Babinski folder, pt has been seen by Susann Givens and [name]  not sure who needs  to complete

## 2023-05-17 NOTE — Patient Instructions (Addendum)
Your right ear still looks a little abnormal and you still have some symptoms  Recommendations: Go ahead and stop the amoxicillin antibiotic and lets change to Augmentin which is a little stronger Use Augmentin antibiotic twice daily for 5 to 7 days Make sure you are drinking at least 100 or more ounces of water or clear fluids over the next few days Begin meclizine twice a day to help with congestion and dizziness.  This can cause a little bit of drowsiness.  If you get really drowsy with it then you can just use a half a tablet at a time If you have some Sudafed left 30 mg from last visit use this twice a day for the next 3 to 5 days along with the recommendations above Expect a phone call about the ENT consult ear nose and throat specialist.  I will check on this referral again today Go as planned next week for the CT chest/abdomen/pelvis to evaluate for weight loss unexplained. If desired, you can also use Afrin nasal spray over the counter twice daily for no more than 3 days at a time.  This is a topical decongestant that can open up the pressure in the ears /sinus. Double check your medications at home that you are not out of refills.  Also bring your pill bottles the next time you come in particular on the physical so we can verify that you are taking what we have on file

## 2023-05-18 ENCOUNTER — Telehealth: Payer: Self-pay | Admitting: Family Medicine

## 2023-05-18 NOTE — Telephone Encounter (Signed)
Alyssa Grove, niece called about pt ( she is aware that we could not give any information but she just wanted to make someone aware)  She is concerned about him, states that something if "off"  states he seems confused, nervous, shaky, Off balance,    She is not sure if he is taking his insulin or not He tells her he is just off balance due to his ear infection

## 2023-05-19 ENCOUNTER — Ambulatory Visit: Payer: Medicare Other | Admitting: Family Medicine

## 2023-05-19 NOTE — Telephone Encounter (Signed)
Pt called to check on FMLA forms

## 2023-05-20 NOTE — Telephone Encounter (Signed)
Everything should be in his chart/Epic

## 2023-05-20 NOTE — Telephone Encounter (Signed)
Spoke to pt  Ears still clogged , can hear a little better out of right ear than left, still feels like fluid in them He is still off balance and gets tired very easily. He has not returned to work  I advised him that one of you may want him to come in for follow up on Monday and that we would call him back Monday

## 2023-05-24 ENCOUNTER — Ambulatory Visit: Payer: Medicare Other | Admitting: Family Medicine

## 2023-05-24 ENCOUNTER — Other Ambulatory Visit: Payer: Self-pay | Admitting: Medical

## 2023-05-24 MED ORDER — NEOMYCIN-POLYMYXIN-HC 1 % OT SOLN: 3.0000 [drp] | Freq: Four times a day (QID) | OTIC | 0 refills | Status: DC

## 2023-05-24 NOTE — Telephone Encounter (Signed)
Left message for pt to call me back 

## 2023-05-24 NOTE — Telephone Encounter (Signed)
Pt was notified.  

## 2023-05-24 NOTE — Telephone Encounter (Signed)
Pt states that his right ear is clogged still and having to walk with a cane. His dizziness is not as bad anymore. Pt has CT on 06/03/23. Pt has not returned to work. He was out last week, and so far Monday and Tuesday of this week. He states he is waiting on FMLA forms to be filled out before he can return to work.  Pt had an appt today with Dr. Susann Givens to discuss FMLA but woke up late and as already been rescheduled for tomorrow to see Dr. Susann Givens

## 2023-05-25 ENCOUNTER — Encounter: Payer: Self-pay | Admitting: Family Medicine

## 2023-05-25 ENCOUNTER — Other Ambulatory Visit: Payer: Medicare Other

## 2023-05-25 ENCOUNTER — Ambulatory Visit (INDEPENDENT_AMBULATORY_CARE_PROVIDER_SITE_OTHER): Payer: Medicare Other | Admitting: Family Medicine

## 2023-05-25 VITALS — BP 136/84 | HR 90 | Wt 152.0 lb

## 2023-05-25 DIAGNOSIS — R42 Dizziness and giddiness: Secondary | ICD-10-CM | POA: Diagnosis not present

## 2023-05-25 NOTE — Progress Notes (Signed)
   Subjective:    Patient ID: Micheal Leblanc, male    DOB: 06-12-1951, 72 y.o.   MRN: [id number]  HPI He is here for a recheck.  He still has difficulty with dizziness.  This has been going on for at least 3 weeks.  He has been referred to ENT and does have an appointment set up.  The dizziness is worse with practically any motion.  If he moves more slowly the dizziness is not as pronounced.  He has had no blurred vision, double vision, weakness numbness or tingling.  He was given meclizine but does not know whether is done any good or not.   Review of Systems     Objective:    Physical Exam Alert and in no distress.  EOMI.  Other cranial nerves grossly intact no dizziness elicited with motion of the head.  TMs clear.  Neck supple without adenopathy or thyromegaly.       Assessment & Plan:  Dizziness He will follow-up with ENT and then we will move on from there.  He is also to call me back and let me know whether he notes any difference with the meclizine.

## 2023-06-01 ENCOUNTER — Ambulatory Visit (INDEPENDENT_AMBULATORY_CARE_PROVIDER_SITE_OTHER): Payer: Medicare Other | Admitting: Medical

## 2023-06-01 ENCOUNTER — Encounter: Payer: Self-pay | Admitting: Medical

## 2023-06-01 VITALS — BP 122/74 | HR 65 | Wt 147.8 lb

## 2023-06-01 DIAGNOSIS — R270 Ataxia, unspecified: Secondary | ICD-10-CM

## 2023-06-01 DIAGNOSIS — R296 Repeated falls: Secondary | ICD-10-CM

## 2023-06-01 DIAGNOSIS — M25512 Pain in left shoulder: Secondary | ICD-10-CM

## 2023-06-01 DIAGNOSIS — W19XXXA Unspecified fall, initial encounter: Secondary | ICD-10-CM

## 2023-06-01 DIAGNOSIS — M25612 Stiffness of left shoulder, not elsewhere classified: Secondary | ICD-10-CM

## 2023-06-01 DIAGNOSIS — H938X1 Other specified disorders of right ear: Secondary | ICD-10-CM

## 2023-06-01 DIAGNOSIS — S0181XA Laceration without foreign body of other part of head, initial encounter: Secondary | ICD-10-CM | POA: Diagnosis not present

## 2023-06-01 NOTE — Progress Notes (Signed)
Subjective:  Micheal Leblanc is a 72 y.o. male who presents for Chief Complaint  Patient presents with   other    Shoulder pain, lt. Shoulder feel last week and hit it. Feel getting out of tub on a wet floor, not picking up anything in that arm hurts when moving it up and down, taking Tylenol 500 mg two and took two more about 5 o clock,      Here with son  Larey Seat about a week ago, slipped getting out of shower.  Left shoulder landed against wall and window seal.   Has had some ongoing pain since the injury.   Last night pain was worse.   Not able to pull self up out of bed with left arm.   No numbness or tingling in left arm.  No left wrist or elbow pain.   Using some tylenol for pain, 500mg  x 2 BID.  Using some topical biofreeze  Sometime last week maybe the same day he fell in the shower he missed his last step in the garage coming to the house.  He ended up hitting his right forehead against the wall.  No loss of consciousness.  No current headache confusion or dizziness.  He still has some ear pressure but is improving since using the eardrops from last visit.  He still is awaiting information on ENT visit  No other aggravating or relieving factors.    No other c/o.  Past Medical History:  Diagnosis Date   Allergy    Diabetes mellitus    ED (erectile dysfunction)    Hx of colonic polyps    Hyperlipidemia    Hypertension    Current Outpatient Medications on File Prior to Visit  Medication Sig Dispense Refill   Accu-Chek Softclix Lancets lancets Use as instructed 200 each 12   atorvastatin (LIPITOR) 20 MG tablet Take 1 tablet (20 mg total) by mouth daily. 90 tablet 1   glucose blood test strip Use as instructed 200 each 12   Insulin Pen Needle (PEN NEEDLES) 31G X 8 MM MISC Use this for lantus solorstar pen 100 each 1   metFORMIN (GLUCOPHAGE-XR) 750 MG 24 hr tablet Take 1 tablet (750 mg total) by mouth 2 (two) times daily. (Patient taking differently: Take 750 mg by mouth 2 (two)  times daily.) 180 tablet 0   NEOMYCIN-POLYMYXIN-HYDROCORTISONE (CORTISPORIN) 1 % SOLN OTIC solution Place 3 drops into both ears 4 (four) times daily. 10 mL 0   Olmesartan-amLODIPine-HCTZ (TRIBENZOR) 40-5-25 MG TABS Take 1 tablet by mouth daily. 90 tablet 3   pioglitazone (ACTOS) 30 MG tablet TAKE 1 TABLET BY MOUTH EVERY DAY 90 tablet 1   amoxicillin-clavulanate (AUGMENTIN) 875-125 MG tablet Take 1 tablet by mouth 2 (two) times daily. (Patient not taking: Reported on 06/01/2023) 14 tablet 0   insulin glargine (LANTUS SOLOSTAR) 100 UNIT/ML Solostar Pen Inject 55 Units into the skin daily. (Patient taking differently: Inject 50 Units into the skin daily.) 15 mL 3   meclizine (ANTIVERT) 25 MG tablet Take 1 tablet (25 mg total) by mouth 2 (two) times daily. (Patient not taking: Reported on 06/01/2023) 30 tablet 0   pantoprazole (PROTONIX) 40 MG tablet Take 1 tablet (40 mg total) by mouth daily. 30 tablet 0   pseudoephedrine (SUDAFED) 30 MG tablet Take 1 tablet (30 mg total) by mouth in the morning and at bedtime. (Patient not taking: Reported on 05/17/2023) 30 tablet 0   No current facility-administered medications on file prior to visit.  The following portions of the patient's history were reviewed and updated as appropriate: allergies, current medications, past family history, past medical history, past social history, past surgical history and problem list.  ROS Otherwise as in subjective above  Objective: BP 122/74   Pulse 65   Wt 147 lb 12.8 oz (67 kg)   BMI 20.61 kg/m   General appearance: alert, no distress, well developed, well nourished Left shoulder nontender to palpation, range of motion decreased in general about 70% of normal, there is some resistance of the shoulder into external range of motion and apprehension test.  No obvious bruising or swelling.  Rest of arm nontender Arms neurovascularly intact Right face lateral to the right orbit with about a 2 cm laceration that is  trying to heal by secondary intention.  No pus drainage, no signs of cellulitis.  Just some oozing and crusting of blood otherwise HEENT unremarkable No facial tenderness otherwise. Right ear still with a little bit of air-fluid levels but looks improved compared to last visit CN II through XII intact, nonfocal exam Notable general muscle atrophy throughout     Assessment: Encounter Diagnoses  Name Primary?   Acute pain of left shoulder Yes   Fall, initial encounter    Decreased ROM of left shoulder    Facial laceration, initial encounter    Ear pressure, right    Ataxia    Frequent falls      Plan: Left shoulder pain, fall Advised to go for shoulder x-ray Can continue Tylenol as needed Advised to use an arm sling throughout the day to help rest the shoulder.  We discussed some gentle stretching range of motion in a bent over position, circular stretches with the shoulder Assuming no fracture on x-ray we will refer to physical therapy  Fall, right facial laceration about a week ago Cleaned wound on the right forehead.  Applied Steri-Strip We discussed that if this happens again he needs to come in right away as this would have warranted sutures It will have to heal by secondary intention at this point If any signs of redness, pus or worse pain and let us know right away or recheck  Plan will be to refer to physical therapy for the shoulder as well as frequent falls  Ear pressure on the right-improving most recently with eardrops.  Still pending ENT consult   Ronnell was seen today for other.  Diagnoses and all orders for this visit:  Acute pain of left shoulder -     DG Shoulder Left; Future -     Ambulatory referral to Physical Therapy  Fall, initial encounter -     DG Shoulder Left; Future -     Ambulatory referral to Physical Therapy  Decreased ROM of left shoulder -     DG Shoulder Left; Future -     Ambulatory referral to Physical Therapy  Facial  laceration, initial encounter  Ear pressure, right  Ataxia -     Ambulatory referral to Physical Therapy  Frequent falls -     Ambulatory referral to Physical Therapy    Follow up: Pending x-ray

## 2023-06-01 NOTE — Patient Instructions (Signed)
Please go to [facility] for your left shoulder xray.   Their hours are 8am - 4:30 pm Monday - Friday.  Take your insurance card with you.  [facility] [phone]  [address] [place], [place] [address]

## 2023-06-02 ENCOUNTER — Ambulatory Visit
Admission: RE | Admit: 2023-06-02 | Discharge: 2023-06-02 | Disposition: A | Discharge status: Home / Self Care | Payer: Medicare Other | Source: Ambulatory Visit | Attending: Medical | Admitting: Medical

## 2023-06-02 DIAGNOSIS — M25512 Pain in left shoulder: Secondary | ICD-10-CM | POA: Diagnosis not present

## 2023-06-02 DIAGNOSIS — M25612 Stiffness of left shoulder, not elsewhere classified: Secondary | ICD-10-CM

## 2023-06-02 DIAGNOSIS — W19XXXA Unspecified fall, initial encounter: Secondary | ICD-10-CM

## 2023-06-03 ENCOUNTER — Other Ambulatory Visit: Payer: Medicare Other

## 2023-06-03 NOTE — Progress Notes (Signed)
Results sent through MyChart

## 2023-06-14 ENCOUNTER — Institutional Professional Consult (permissible substitution) (INDEPENDENT_AMBULATORY_CARE_PROVIDER_SITE_OTHER): Payer: Medicare Other

## 2023-06-15 ENCOUNTER — Ambulatory Visit: Payer: Medicare Other | Admitting: Family Medicine

## 2023-06-15 ENCOUNTER — Encounter: Payer: Self-pay | Admitting: Family Medicine

## 2023-06-15 VITALS — BP 118/74 | HR 72 | Temp 97.5°F | Wt 148.2 lb

## 2023-06-15 DIAGNOSIS — R634 Abnormal weight loss: Secondary | ICD-10-CM

## 2023-06-15 LAB — POCT URINALYSIS DIP (PROADVANTAGE DEVICE)
Blood, UA: NEGATIVE
Glucose, UA: NEGATIVE mg/dL
Leukocytes, UA: NEGATIVE
Nitrite, UA: NEGATIVE
Protein Ur, POC: 100 mg/dL — AB
Specific Gravity, Urine: 1.015
Urobilinogen, Ur: 0.2
pH, UA: 7.5 (ref 5.0–8.0)

## 2023-06-15 MED ORDER — MECLIZINE HCL 25 MG PO TABS: 25.0000 mg | ORAL_TABLET | Freq: Two times a day (BID) | ORAL | 0 refills | Status: AC

## 2023-06-15 MED ORDER — NEOMYCIN-POLYMYXIN-HC 1 % OT SOLN: 3.0000 [drp] | Freq: Four times a day (QID) | OTIC | 0 refills | Status: DC

## 2023-06-15 NOTE — Progress Notes (Signed)
   Subjective:    Patient ID: Micheal Leblanc, male    DOB: 1950/10/04, 72 y.o.   MRN: [id number]  HPI He is here mainly stating that he notes a foul smell to his urine.  Review of the record indicates that he has continued to lose weight over the last several months.  He states that he just did not feel hungry.  He has had no nausea, vomiting, abdominal pain, constipation or diarrhea.   Review of Systems     Objective:    Physical Exam Alert and in no distress.  Urinalysis is recorded.  Abnormalities are noted.       Assessment & Plan:  Unintentional weight loss - Plan: POCT Urinalysis DIP (Proadvantage Device), CBC with Differential/Platelet, Comprehensive metabolic panel He has also been dealing with dizziness which has complicated looking further into the weight issue.  Await on blood work to see if there is anything significant and if not then I will refer to GI.

## 2023-06-16 LAB — CBC WITH DIFFERENTIAL/PLATELET
Basophils Absolute: 0 10*3/uL (ref 0.0–0.2)
Basos: 1 %
EOS (ABSOLUTE): 0.2 10*3/uL (ref 0.0–0.4)
Eos: 5 %
Hematocrit: 42.5 % (ref 37.5–51.0)
Hemoglobin: 13.7 g/dL (ref 13.0–17.7)
Immature Grans (Abs): 0 10*3/uL (ref 0.0–0.1)
Immature Granulocytes: 0 %
Lymphocytes Absolute: 2 10*3/uL (ref 0.7–3.1)
Lymphs: 42 %
MCH: 28.1 pg (ref 26.6–33.0)
MCHC: 32.2 g/dL (ref 31.5–35.7)
MCV: 87 fL (ref 79–97)
Monocytes Absolute: 0.3 10*3/uL (ref 0.1–0.9)
Monocytes: 7 %
Neutrophils Absolute: 2.1 10*3/uL (ref 1.4–7.0)
Neutrophils: 45 %
Platelets: 160 10*3/uL (ref 150–450)
RBC: 4.87 x10E6/uL (ref 4.14–5.80)
RDW: 12.1 % (ref 11.6–15.4)
WBC: 4.7 10*3/uL (ref 3.4–10.8)

## 2023-06-16 LAB — COMPREHENSIVE METABOLIC PANEL
ALT: 12 IU/L (ref 0–44)
AST: 15 IU/L (ref 0–40)
Albumin: 4.4 g/dL (ref 3.8–4.8)
Alkaline Phosphatase: 91 IU/L (ref 44–121)
BUN/Creatinine Ratio: 13 (ref 10–24)
BUN: 14 mg/dL (ref 8–27)
Bilirubin Total: 0.8 mg/dL (ref 0.0–1.2)
CO2: 23 mmol/L (ref 20–29)
Calcium: 9.9 mg/dL (ref 8.6–10.2)
Chloride: 104 mmol/L (ref 96–106)
Creatinine, Ser: 1.11 mg/dL (ref 0.76–1.27)
Globulin, Total: 2.7 g/dL (ref 1.5–4.5)
Glucose: 83 mg/dL (ref 70–99)
Potassium: 4.5 mmol/L (ref 3.5–5.2)
Sodium: 142 mmol/L (ref 134–144)
Total Protein: 7.1 g/dL (ref 6.0–8.5)
eGFR: 71 mL/min/{1.73_m2} (ref >59)

## 2023-06-16 NOTE — Addendum Note (Signed)
Addended by: Ronnald Nian on: 06/16/2023 01:42 PM   Modules accepted: Orders

## 2023-07-05 ENCOUNTER — Telehealth: Payer: Self-pay | Admitting: Internal Medicine

## 2023-07-05 NOTE — Telephone Encounter (Signed)
 Patient called and states that his weight loss and dizziness has improved but he says he doesn't having the strength to pull up in the bed. He says his arms feel weak and doesn't have strength in them. Pt was advised that his blood work from 12/18 was normal

## 2023-07-06 NOTE — Telephone Encounter (Signed)
 Left message for pt to schedule appointment.

## 2023-07-07 ENCOUNTER — Ambulatory Visit (INDEPENDENT_AMBULATORY_CARE_PROVIDER_SITE_OTHER): Payer: Medicare Other | Admitting: Family Medicine

## 2023-07-07 ENCOUNTER — Encounter: Payer: Self-pay | Admitting: Family Medicine

## 2023-07-07 VITALS — BP 134/82 | HR 60 | Temp 98.1°F | Wt 154.6 lb

## 2023-07-07 DIAGNOSIS — G8929 Other chronic pain: Secondary | ICD-10-CM

## 2023-07-07 DIAGNOSIS — R29898 Other symptoms and signs involving the musculoskeletal system: Secondary | ICD-10-CM | POA: Diagnosis not present

## 2023-07-07 DIAGNOSIS — M25512 Pain in left shoulder: Secondary | ICD-10-CM | POA: Diagnosis not present

## 2023-07-07 NOTE — Progress Notes (Signed)
   Subjective:    Patient ID: Micheal Leblanc, male    DOB: August 24, 1950, 73 y.o.   MRN: [id number]  HPI He is here for an interval evaluation.  He does state that the dizziness diminished approximately 2 weeks ago.  His main concern today is weakness of his right leg that he now states he has had since he had the dizziness that started in early November.  Review of the record indicates he did not mention this to me.  There is no associated numbness pain or tingling.  He did say that he is fallen on two separate occasions because of that.  He has been out of work since the dizziness and does plan to go back in the near future.  He is mainly concerned about weakness and falling. He is also here because of difficulty with left shoulder pain that he states has been going on for the last several years.  He describes it as constant.  Says that it gets worse when he abducts.  He also notes that it hurts when he lays on it.  Review of record indicates he did have a left clavicle fracture that was taken care of in April of this year.  He was seen at [facility] for this.  He actually did not remember that.  It was very difficult to get a good history from him.  Review of Systems     Objective:    Physical Exam Exam of the left shoulder shows some limitation with abduction.  No palpable tenderness.  Negative drop arm test.  Supraspinatus testing was uncomfortable.  Sulcus test is negative.  Strength appears normal. Exam of the left leg shows fairly good hip motion with no pain.  No palpable tenderness to the hip area or to the lower extremity.  Reflexes were diminished.  Strength is normal.  Sensation normal.       Assessment & Plan:  Chronic left shoulder pain - Plan: Ambulatory referral to Orthopedic Surgery  Right leg weakness - Plan: Ambulatory referral to Orthopedic Surgery Very difficult to get a good history from him as he was very vague in his responses.  The exam was really not contributory  although the decrease in his reflexes is interesting.

## 2023-07-20 ENCOUNTER — Encounter: Payer: Self-pay | Admitting: Medical

## 2023-07-20 DIAGNOSIS — Z125 Encounter for screening for malignant neoplasm of prostate: Secondary | ICD-10-CM | POA: Insufficient documentation

## 2023-07-22 ENCOUNTER — Ambulatory Visit (HOSPITAL_BASED_OUTPATIENT_CLINIC_OR_DEPARTMENT_OTHER): Payer: Medicare Other | Admitting: Orthopaedic Surgery

## 2023-07-28 ENCOUNTER — Other Ambulatory Visit: Payer: Self-pay | Admitting: Family Medicine

## 2023-07-28 DIAGNOSIS — E1169 Type 2 diabetes mellitus with other specified complication: Secondary | ICD-10-CM

## 2023-08-01 ENCOUNTER — Telehealth: Payer: Self-pay | Admitting: Family Medicine

## 2023-08-01 NOTE — Telephone Encounter (Signed)
Pt needs letter for his Mortgage company so they can help him out with his mortgage Since he has been sick so much and out or work he is behind on his mortgage and he is trying to get some help but needs letter. Patient states he needs letter soon

## 2023-08-02 DIAGNOSIS — R404 Transient alteration of awareness: Secondary | ICD-10-CM | POA: Diagnosis not present

## 2023-08-02 DIAGNOSIS — R61 Generalized hyperhidrosis: Secondary | ICD-10-CM | POA: Diagnosis not present

## 2023-08-03 NOTE — Telephone Encounter (Signed)
 Pt says the letter should explain why he has been out of work due to his health issues. He says he has been out of work since around October.  Also he says his shoulder is in excruciating pain and is asking if something can be sent in to help him until he sees ortho 08/11/23.

## 2023-08-04 ENCOUNTER — Encounter: Payer: Self-pay | Admitting: Medical

## 2023-08-11 ENCOUNTER — Ambulatory Visit (HOSPITAL_BASED_OUTPATIENT_CLINIC_OR_DEPARTMENT_OTHER): Payer: Medicare Other | Admitting: Orthopaedic Surgery

## 2023-08-12 ENCOUNTER — Encounter: Payer: Self-pay | Admitting: *Deleted

## 2023-08-12 ENCOUNTER — Ambulatory Visit (INDEPENDENT_AMBULATORY_CARE_PROVIDER_SITE_OTHER): Payer: Medicare Other | Admitting: Medical

## 2023-08-12 ENCOUNTER — Other Ambulatory Visit: Payer: Self-pay | Admitting: *Deleted

## 2023-08-12 VITALS — BP 122/70 | HR 85 | Temp 97.5°F | Wt 150.0 lb

## 2023-08-12 DIAGNOSIS — E1121 Type 2 diabetes mellitus with diabetic nephropathy: Secondary | ICD-10-CM

## 2023-08-12 DIAGNOSIS — R0602 Shortness of breath: Secondary | ICD-10-CM | POA: Diagnosis not present

## 2023-08-12 DIAGNOSIS — M25511 Pain in right shoulder: Secondary | ICD-10-CM

## 2023-08-12 DIAGNOSIS — Z87891 Personal history of nicotine dependence: Secondary | ICD-10-CM

## 2023-08-12 DIAGNOSIS — Z789 Other specified health status: Secondary | ICD-10-CM | POA: Diagnosis not present

## 2023-08-12 DIAGNOSIS — E1169 Type 2 diabetes mellitus with other specified complication: Secondary | ICD-10-CM | POA: Diagnosis not present

## 2023-08-12 DIAGNOSIS — R5383 Other fatigue: Secondary | ICD-10-CM

## 2023-08-12 DIAGNOSIS — R634 Abnormal weight loss: Secondary | ICD-10-CM

## 2023-08-12 DIAGNOSIS — R8290 Unspecified abnormal findings in urine: Secondary | ICD-10-CM

## 2023-08-12 DIAGNOSIS — F1090 Alcohol use, unspecified, uncomplicated: Secondary | ICD-10-CM

## 2023-08-12 DIAGNOSIS — E785 Hyperlipidemia, unspecified: Secondary | ICD-10-CM | POA: Diagnosis not present

## 2023-08-12 DIAGNOSIS — E1165 Type 2 diabetes mellitus with hyperglycemia: Secondary | ICD-10-CM

## 2023-08-12 NOTE — Patient Instructions (Signed)
Shoulder pain-follow-up with orthopedic as planned/scheduled.  Talk to the orthopedist about both of your shoulder issues  I am checking several labs today given your variety of symptoms  Causes of fatigue and lack of appetite and how you are feeling could be related to vitamin deficiency, heart or lung issue, tumor, or other issue.  I have placed an order for a scan to check your chest abdomen pelvis  We will call with lab results and other recommendations  Drink at least 80 to 100 ounces of water daily

## 2023-08-12 NOTE — Progress Notes (Signed)
Subjective:  Micheal Leblanc is a 73 y.o. male who presents for Chief Complaint  Patient presents with   multiple issues    Multiple issues- urine smells like amonia. Been smelling, like this for a while, balance is still off, needs more FMLA forms      Here for multiple concerns.  Here with son.    Been out of work 3.5 months and is behind on mortgage. Needs letter to submit to mortgage company.    He notes urine has ammonia like smell.  Has been going on for at least a month or 2.  Urine has a dark color, no cloudy urine.  No urinary frequency or burning.   Has some urinary incontinence at times.   No fever, no body aches or chills.   No blood in urine.  Gets up about once nightly to urinate.  No problems with bowels.    No energy, fatigue.  No chest pain, no dyspnea.  No palpitations, no edema.   Has ongoing right shoulder issues, can't roll over on right side.  Was having issues with left shoulder prior, saw Dr. Susann Givens last month for this.    Now has right shoulder pain.   A month or so ago, had fall after legs gave out, landed on right side and shoulder.  Can't raise right shoulder.    Has appt with orthopedic 08/24/23 per Dr. Susann Givens referral.    Appetite down.  Eats on average twice daily but goes hours between meals.   Former smoker.   Quit smoking over a year ago.    Drinks alcohol, some yesterday.  5-7 beers weekly.   No other aggravating or relieving factors.    No other c/o.  Past Medical History:  Diagnosis Date   Allergy    Diabetes mellitus    ED (erectile dysfunction)    Hx of colonic polyps    Hyperlipidemia    Hypertension    Current Outpatient Medications on File Prior to Visit  Medication Sig Dispense Refill   atorvastatin (LIPITOR) 20 MG tablet TAKE 1 TABLET BY MOUTH EVERY DAY 90 tablet 1   insulin glargine (LANTUS SOLOSTAR) 100 UNIT/ML Solostar Pen Inject 55 Units into the skin daily. (Patient taking differently: Inject 50 Units into the skin daily.)  15 mL 3   meclizine (ANTIVERT) 25 MG tablet Take 1 tablet (25 mg total) by mouth 2 (two) times daily. 30 tablet 0   metFORMIN (GLUCOPHAGE-XR) 750 MG 24 hr tablet Take 1 tablet (750 mg total) by mouth 2 (two) times daily. (Patient taking differently: Take 750 mg by mouth 2 (two) times daily.) 180 tablet 0   Olmesartan-amLODIPine-HCTZ (TRIBENZOR) 40-5-25 MG TABS Take 1 tablet by mouth daily. 90 tablet 3   pioglitazone (ACTOS) 30 MG tablet TAKE 1 TABLET BY MOUTH EVERY DAY 90 tablet 1   Accu-Chek Softclix Lancets lancets Use as instructed 200 each 12   glucose blood test strip Use as instructed 200 each 12   Insulin Pen Needle (PEN NEEDLES) 31G X 8 MM MISC Use this for lantus solorstar pen 100 each 1   pantoprazole (PROTONIX) 40 MG tablet Take 1 tablet (40 mg total) by mouth daily. 30 tablet 0   No current facility-administered medications on file prior to visit.     The following portions of the patient's history were reviewed and updated as appropriate: allergies, current medications, past family history, past medical history, past social history, past surgical history and problem list.  ROS Otherwise  as in subjective above    Objective: BP 122/70   Pulse 85   Temp (!) 97.5 F (36.4 C)   Wt 150 lb (68 kg)   SpO2 98%   BMI 20.92 kg/m   Wt Readings from Last 3 Encounters:  08/12/23 150 lb (68 kg)  07/07/23 154 lb 9.6 oz (70.1 kg)  06/15/23 148 lb 3.2 oz (67.2 kg)    General appearance: alert, no distress, somewhat malnourished appearing HEENT: normocephalic, sclerae anicteric, conjunctiva pink and moist,  nares patent, no discharge or erythema, pharynx normal Oral cavity: MMM, no lesions Neck: supple, no lymphadenopathy, no thyromegaly, no masses Heart: RRR, normal S1, S2, no murmurs Lungs: CTA bilaterally, no wheezes, rhonchi, or rales Abdomen: +bs, soft, non tender, non distended, no masses, no hepatomegaly, no splenomegaly MSK: tender right AC joint, otherwise nontender to  palpation, he has some mildly reduced internal and external ROM, otherwise nontender and no other deformity.    Back nontender Pulses: 2+ radial pulses, 1+ pedal pulses, normal cap refill Ext: no edema Neuro: CN2-12 intact, nonfocal exam  EKG shows possible left atrial enlargement, 62 bpm, no other acute changes    Assessment: Encounter Diagnoses  Name Primary?   Fatigue, unspecified type Yes   Alcohol use    Former smoker    Right shoulder pain, unspecified chronicity    Type 2 diabetes mellitus with hyperglycemia, unspecified whether long term insulin use (HCC)    Hyperlipidemia associated with type 2 diabetes mellitus (HCC)    Diabetic nephropathy associated with type 2 diabetes mellitus (HCC)    Unintentional weight loss      Plan: He has several different symptoms.  He apparently has not been back to work for several months now since he had come here back in November 2024 for dizziness and ear issues.  He continues to complain of fatigue, some generalized weakness, decreased appetite, weight loss  He has new acute right shoulder pain and has already been referred recently for orthopedics for left shoulder pain.  He will see them within the next 2 weeks  EKG reviewed today, no major red flag findings  He does have history of tobacco use and quit in the past year.  He drinks alcohol fairly regularly.  CT scan placed today given the unexplained weight loss, former smoker and other concerns.  Labs as below today  Wrote letter regarding his absence for illness that he can submit to the mortgage company.   Tres was seen today for multiple issues.  Diagnoses and all orders for this visit:  Fatigue, unspecified type -     CT CHEST ABDOMEN PELVIS W CONTRAST; Future -     Brain natriuretic peptide -     Comprehensive metabolic panel -     CBC with Differential/Platelet -     TSH -     Vitamin B12 -     Folate -     Lactate dehydrogenase -     Hemoglobin A1c -      EKG 12-Lead  Alcohol use -     TSH -     Vitamin B12 -     Folate  Former smoker -     Brain natriuretic peptide  Right shoulder pain, unspecified chronicity -     EKG 12-Lead  Type 2 diabetes mellitus with hyperglycemia, unspecified whether long term insulin use (HCC) -     Brain natriuretic peptide -     Hemoglobin A1c  Hyperlipidemia associated with type  2 diabetes mellitus (HCC) -     Brain natriuretic peptide -     EKG 12-Lead  Diabetic nephropathy associated with type 2 diabetes mellitus (HCC)  Unintentional weight loss -     Comprehensive metabolic panel -     CBC with Differential/Platelet -     TSH -     Vitamin B12 -     Folate -     Lactate dehydrogenase   Follow up: pending labs, scan, ortho

## 2023-08-12 NOTE — Addendum Note (Signed)
Addended by: Herminio Commons A on: 08/12/2023 11:41 AM   Modules accepted: Orders

## 2023-08-13 LAB — VITAMIN B12: Vitamin B-12: 683 pg/mL (ref 232–1245)

## 2023-08-13 LAB — COMPREHENSIVE METABOLIC PANEL
ALT: 19 [IU]/L (ref 0–44)
AST: 23 [IU]/L (ref 0–40)
Albumin: 4.3 g/dL (ref 3.8–4.8)
Alkaline Phosphatase: 94 [IU]/L (ref 44–121)
BUN/Creatinine Ratio: 15 (ref 10–24)
BUN: 18 mg/dL (ref 8–27)
Bilirubin Total: 0.8 mg/dL (ref 0.0–1.2)
CO2: 24 mmol/L (ref 20–29)
Calcium: 10.1 mg/dL (ref 8.6–10.2)
Chloride: 97 mmol/L (ref 96–106)
Creatinine, Ser: 1.24 mg/dL (ref 0.76–1.27)
Globulin, Total: 3 g/dL (ref 1.5–4.5)
Glucose: 135 mg/dL — ABNORMAL HIGH (ref 70–99)
Potassium: 4 mmol/L (ref 3.5–5.2)
Sodium: 136 mmol/L (ref 134–144)
Total Protein: 7.3 g/dL (ref 6.0–8.5)
eGFR: 62 mL/min/{1.73_m2} (ref >59)

## 2023-08-13 LAB — FOLATE: Folate: 7.5 ng/mL (ref >3.0)

## 2023-08-13 LAB — CBC WITH DIFFERENTIAL/PLATELET
Basophils Absolute: 0 10*3/uL (ref 0.0–0.2)
Basos: 1 %
EOS (ABSOLUTE): 0.4 10*3/uL (ref 0.0–0.4)
Eos: 8 %
Hematocrit: 50.3 % (ref 37.5–51.0)
Hemoglobin: 16.5 g/dL (ref 13.0–17.7)
Immature Grans (Abs): 0 10*3/uL (ref 0.0–0.1)
Immature Granulocytes: 0 %
Lymphocytes Absolute: 2 10*3/uL (ref 0.7–3.1)
Lymphs: 45 %
MCH: 28.9 pg (ref 26.6–33.0)
MCHC: 32.8 g/dL (ref 31.5–35.7)
MCV: 88 fL (ref 79–97)
Monocytes Absolute: 0.3 10*3/uL (ref 0.1–0.9)
Monocytes: 7 %
Neutrophils Absolute: 1.8 10*3/uL (ref 1.4–7.0)
Neutrophils: 39 %
Platelets: 158 10*3/uL (ref 150–450)
RBC: 5.71 x10E6/uL (ref 4.14–5.80)
RDW: 13.3 % (ref 11.6–15.4)
WBC: 4.6 10*3/uL (ref 3.4–10.8)

## 2023-08-13 LAB — URINALYSIS, ROUTINE W REFLEX MICROSCOPIC
Bilirubin, UA: NEGATIVE
Glucose, UA: NEGATIVE
Ketones, UA: NEGATIVE
Leukocytes,UA: NEGATIVE
Nitrite, UA: NEGATIVE
RBC, UA: NEGATIVE
Specific Gravity, UA: 1.019 (ref 1.005–1.030)
Urobilinogen, Ur: 1 mg/dL (ref 0.2–1.0)
pH, UA: 5.5 (ref 5.0–7.5)

## 2023-08-13 LAB — LACTATE DEHYDROGENASE: LDH: 165 [IU]/L (ref 121–224)

## 2023-08-13 LAB — HEMOGLOBIN A1C
Est. average glucose Bld gHb Est-mCnc: 117 mg/dL
Hgb A1c MFr Bld: 5.7 % — ABNORMAL HIGH (ref 4.8–5.6)

## 2023-08-13 LAB — TSH: TSH: 3.2 u[IU]/mL (ref 0.450–4.500)

## 2023-08-13 LAB — MICROSCOPIC EXAMINATION
Bacteria, UA: NONE SEEN
Casts: NONE SEEN /[LPF]
Epithelial Cells (non renal): NONE SEEN /[HPF] (ref 0–10)
WBC, UA: NONE SEEN /[HPF] (ref 0–5)

## 2023-08-13 LAB — BRAIN NATRIURETIC PEPTIDE: BNP: 30.3 pg/mL (ref 0.0–100.0)

## 2023-08-14 NOTE — Progress Notes (Signed)
Urine test was okay.  Disregard other message about returning for urine sample since you apparently did this already

## 2023-08-14 NOTE — Progress Notes (Signed)
Your labs show normal kidney, electrolytes, liver, normal thyroid, normal B12, normal folate, normal blood count thankfully.  Heart marker thankfully was normal showing no sign of heart failure.  Diabetes marker is at risk for diabetes but stable  Return soon with a morning clean-catch urine sample and expect a phone call about doing a CT chest abdomen and pelvis.

## 2023-08-15 ENCOUNTER — Other Ambulatory Visit: Payer: Medicare Other

## 2023-08-23 ENCOUNTER — Encounter: Payer: Self-pay | Admitting: Internal Medicine

## 2023-08-24 ENCOUNTER — Ambulatory Visit (HOSPITAL_BASED_OUTPATIENT_CLINIC_OR_DEPARTMENT_OTHER): Payer: Medicare Other | Admitting: Orthopaedic Surgery

## 2023-08-25 ENCOUNTER — Ambulatory Visit (HOSPITAL_BASED_OUTPATIENT_CLINIC_OR_DEPARTMENT_OTHER): Payer: Medicare Other | Admitting: Student

## 2023-08-25 ENCOUNTER — Ambulatory Visit (HOSPITAL_BASED_OUTPATIENT_CLINIC_OR_DEPARTMENT_OTHER): Payer: Medicare Other

## 2023-08-25 DIAGNOSIS — M47812 Spondylosis without myelopathy or radiculopathy, cervical region: Secondary | ICD-10-CM | POA: Diagnosis not present

## 2023-08-25 DIAGNOSIS — M19011 Primary osteoarthritis, right shoulder: Secondary | ICD-10-CM | POA: Diagnosis not present

## 2023-08-25 DIAGNOSIS — M542 Cervicalgia: Secondary | ICD-10-CM

## 2023-08-25 DIAGNOSIS — M25511 Pain in right shoulder: Secondary | ICD-10-CM

## 2023-08-25 DIAGNOSIS — G8929 Other chronic pain: Secondary | ICD-10-CM | POA: Diagnosis not present

## 2023-08-25 MED ORDER — TRIAMCINOLONE ACETONIDE 40 MG/ML IJ SUSP
2.0000 mL | INTRAMUSCULAR | Status: AC | PRN
  Administered 2023-08-25: 2 mL via INTRA_ARTICULAR

## 2023-08-25 MED ORDER — LIDOCAINE HCL 1 % IJ SOLN
4.0000 mL | INTRAMUSCULAR | Status: AC | PRN
  Administered 2023-08-25: 4 mL

## 2023-08-25 NOTE — Progress Notes (Signed)
 Chief Complaint: Bilateral shoulder pain     History of Present Illness:    Micheal Leblanc is a 73 y.o. male presenting today for evaluation of bilateral shoulder pain right worse than left.  Reports that pain began about 6 months ago without any known injury.  He does have history of a distal clavicle fracture in the left shoulder which she was seen here for last year.  Patient states that the pain goes up into his neck but is mainly located over the lateral shoulders.  Pain occasionally radiates down the right arm into the hand.  Denies any numbness or tingling.  Rates pain as moderate to severe.  He is taking Tylenol consistently for pain management.  He is right-hand dominant.  Also does report some balance and gait instability issues that have become more noticeable over the last few months.  Clavicle fracture sustained last year was due to a fall.   Surgical History:   None  PMH/PSH/Family History/Social History/Meds/Allergies:    Past Medical History:  Diagnosis Date   Allergy    Diabetes mellitus    ED (erectile dysfunction)    Hx of colonic polyps    Hyperlipidemia    Hypertension    Past Surgical History:  Procedure Laterality Date   COLONOSCOPY     POLYPECTOMY     Social History   Socioeconomic History   Marital status: Single    Spouse name: Not on file   Number of children: Not on file   Years of education: Not on file   Highest education level: Not on file  Occupational History   Not on file  Tobacco Use   Smoking status: Former    Current packs/day: 0.00    Types: Cigarettes    Quit date: 04/06/2021    Years since quitting: 2.3   Smokeless tobacco: Never  Vaping Use   Vaping status: Never Used  Substance and Sexual Activity   Alcohol use: Yes    Alcohol/week: 4.0 standard drinks of alcohol    Types: 2 Cans of beer, 2 Shots of liquor per week    Comment: no change 3-4 on the weekend.   Drug use: No   Sexual  activity: Not Currently  Other Topics Concern   Not on file  Social History Narrative   Not on file   Social Drivers of Health   Financial Resource Strain: Low Risk  (12/21/2022)   Overall Financial Resource Strain (CARDIA)    Difficulty of Paying Living Expenses: Not very hard  Food Insecurity: No Food Insecurity (12/11/2021)   Hunger Vital Sign    Worried About Running Out of Food in the Last Year: Never true    Ran Out of Food in the Last Year: Never true  Transportation Needs: No Transportation Needs (12/11/2021)   PRAPARE - Administrator, Civil Service (Medical): No    Lack of Transportation (Non-Medical): No  Physical Activity: Insufficiently Active (12/21/2022)   Exercise Vital Sign    Days of Exercise per Week: 3 days    Minutes of Exercise per Session: 30 min  Stress: No Stress Concern Present (12/21/2022)   [employer] of Occupational Health - Occupational Stress Questionnaire    Feeling of Stress : Not at all  Social Connections: Unknown (12/21/2022)   Social Connection  and Isolation Panel [NHANES]    Frequency of Communication with Friends and Family: Not on file    Frequency of Social Gatherings with Friends and Family: Twice a week    Attends Religious Services: Patient declined    Database administrator or Organizations: No    Attends Engineer, structural: Never    Marital Status: Not on file   Family History  Problem Relation Age of Onset   Arthritis Mother    Colon cancer Neg Hx    Rectal cancer Neg Hx    Stomach cancer Neg Hx    Colon polyps Neg Hx    Diabetes Neg Hx    No Known Allergies Current Outpatient Medications  Medication Sig Dispense Refill   Accu-Chek Softclix Lancets lancets Use as instructed 200 each 12   atorvastatin (LIPITOR) 20 MG tablet TAKE 1 TABLET BY MOUTH EVERY DAY 90 tablet 1   glucose blood test strip Use as instructed 200 each 12   insulin glargine (LANTUS SOLOSTAR) 100 UNIT/ML Solostar Pen Inject 55  Units into the skin daily. (Patient taking differently: Inject 50 Units into the skin daily.) 15 mL 3   Insulin Pen Needle (PEN NEEDLES) 31G X 8 MM MISC Use this for lantus solorstar pen 100 each 1   meclizine (ANTIVERT) 25 MG tablet Take 1 tablet (25 mg total) by mouth 2 (two) times daily. 30 tablet 0   metFORMIN (GLUCOPHAGE-XR) 750 MG 24 hr tablet Take 1 tablet (750 mg total) by mouth 2 (two) times daily. (Patient taking differently: Take 750 mg by mouth 2 (two) times daily.) 180 tablet 0   Olmesartan-amLODIPine-HCTZ (TRIBENZOR) 40-5-25 MG TABS Take 1 tablet by mouth daily. 90 tablet 3   pantoprazole (PROTONIX) 40 MG tablet Take 1 tablet (40 mg total) by mouth daily. 30 tablet 0   pioglitazone (ACTOS) 30 MG tablet TAKE 1 TABLET BY MOUTH EVERY DAY 90 tablet 1   No current facility-administered medications for this visit.   No results found.  Review of Systems:   A ROS was performed including pertinent positives and negatives as documented in the HPI.  Physical Exam :   Constitutional: NAD and appears stated age Neurological: Alert and oriented Psych: Appropriate affect and cooperative There were no vitals taken for this visit.   Comprehensive Musculoskeletal Exam:    No tenderness in the cervical spine or paraspinal muscles.  Significantly limited ROM in the cervical spine in all planes.  Negative Spurling's.  Grip strength intact.  Bilateral shoulder ROM to 110 degrees forward flexion, 30 degrees external rotation, and internal rotation to S1.  Tenderness over bilateral lateral deltoids.  Positive right shoulder impingement signs and bilateral weakness with empty can test.  Imaging:   Xray (cervical spine 4 views): Multilevel spondylosis with disc space loss and anterior osteophytes most notable between C5-C7.  Loss of cervical lordotic curvature.  Xray (right shoulder 3 views): Mild glenohumeral and AC joint degenerative changes but otherwise negative   I personally reviewed and  interpreted the radiographs.   Assessment:   73 y.o. male with chronic bilateral shoulder pain right worse than left.  He does have notable degenerative changes throughout the cervical spine as well as occasional symptoms radiating down the entirety of the right arm consistent with more of a radiculopathy.  He does however have loss of active shoulder flexion bilaterally and localized pain indicative of a rotator cuff pathology.  Discussed that his symptoms are likely multifactorial and would recommend proceeding with  physical therapy to help improve his motion and symptoms in the neck as well as to help with gait, balance, and fall prevention.  In the meantime I have recommended a subacromial cortisone injection of the right shoulder.  I would like to assess the amount of relief this brings him in follow-up in another 5 weeks to assess progress with PT.  Plan :    -Referral to physical therapy for neck stiffness and cervical radiculopathy as well as balance and gait training -Right shoulder subacromial cortisone injection performed today -Return to clinic in 5 weeks     Procedure Note  Patient: Micheal Leblanc             Date of Birth: 12-31-1950           MRN: [id number]             Visit Date: 08/25/2023  Procedures: Visit Diagnoses:  1. Cervical spondylosis   2. Chronic right shoulder pain     Large Joint Inj: R subacromial bursa on 08/25/2023 11:33 AM Indications: pain Details: 22 G 1.5 in needle, posterior approach Medications: 4 mL lidocaine 1 %; 2 mL triamcinolone acetonide 40 MG/ML Outcome: tolerated well, no immediate complications Procedure, treatment alternatives, risks and benefits explained, specific risks discussed. Consent was given by the patient. Immediately prior to procedure a time out was called to verify the correct patient, procedure, equipment, support staff and site/side marked as required. Patient was prepped and draped in the usual sterile fashion.       I personally saw and evaluated the patient, and participated in the management and treatment plan.  Hazle Nordmann, PA-C Orthopedics

## 2023-08-29 ENCOUNTER — Ambulatory Visit: Payer: Medicare Other | Admitting: Family Medicine

## 2023-08-30 ENCOUNTER — Ambulatory Visit (INDEPENDENT_AMBULATORY_CARE_PROVIDER_SITE_OTHER): Admitting: Family Medicine

## 2023-08-30 ENCOUNTER — Encounter: Payer: Self-pay | Admitting: Family Medicine

## 2023-08-30 VITALS — BP 140/88 | HR 83 | Wt 150.6 lb

## 2023-08-30 DIAGNOSIS — R634 Abnormal weight loss: Secondary | ICD-10-CM

## 2023-08-30 DIAGNOSIS — R29898 Other symptoms and signs involving the musculoskeletal system: Secondary | ICD-10-CM

## 2023-08-30 DIAGNOSIS — R292 Abnormal reflex: Secondary | ICD-10-CM

## 2023-08-30 NOTE — Progress Notes (Signed)
   Subjective:    Patient ID: Micheal Leblanc, male    DOB: 07/29/1950, 73 y.o.   MRN: [id number]  HPI He is here for a recheck.  He was seen by me for evaluation of left shoulder pain.  I referred to orthopedics however the visit orthopedics ended up being more of an evaluation of his right shoulder which apparently they gave him an injection and he says he is doing much better.  He is having much less difficulty with the left shoulder.  He was seen by [name] on February 14 for evaluation of weight loss.  A CT scan of abdomen pelvis was ordered.  Review of the information indicates he was called on the 14th and on the 19th but he does not remember getting this call and did not follow-up on that. He then mentioned difficulty with weak legs making it difficult for him to do any work over the last several months.  He states that this dates back to June of last year but there is no record that he discussed this with anybody.  His son does note that he does have difficulty getting to a standing position.  He apparently has fallen several times as well.   Review of Systems     Objective:    Physical Exam Alert and in no distress.  He does tend to walk with a narrow gait.  Strength of his lower extremities appears normal however he has no ankle or knee reflexes.  Review of record indicates his B12 level is normal.       Assessment & Plan:  Leg weakness, bilateral - Plan: Ambulatory referral to Neurology  Abnormal DTR (deep tendon reflex) - Plan: Ambulatory referral to Neurology  Unintentional weight loss I gave him the phone number for radiology of [phone].  His son was with him.  Recommend they call to get the CT of abdomen and pelvis set up.  I will also refer to neurology to further evaluate his ambulatory difficulty as well as abnormal DTRs. It has been very difficult for [place] to get a good history from him as he tends to jump from 1 subject to another.  Of asked his son to monitor the  situation.

## 2023-09-01 ENCOUNTER — Encounter: Payer: Self-pay | Admitting: Neurology

## 2023-09-26 ENCOUNTER — Ambulatory Visit
Admission: RE | Admit: 2023-09-26 | Discharge: 2023-09-26 | Disposition: A | Discharge status: Home / Self Care | Source: Ambulatory Visit | Attending: Medical | Admitting: Medical

## 2023-09-26 DIAGNOSIS — R5383 Other fatigue: Secondary | ICD-10-CM

## 2023-09-26 DIAGNOSIS — R634 Abnormal weight loss: Secondary | ICD-10-CM | POA: Diagnosis not present

## 2023-09-26 DIAGNOSIS — K5730 Diverticulosis of large intestine without perforation or abscess without bleeding: Secondary | ICD-10-CM | POA: Diagnosis not present

## 2023-09-26 MED ORDER — IOPAMIDOL (ISOVUE-300) INJECTION 61%
100.0000 mL | Freq: Once | INTRAVENOUS | Status: AC | PRN
  Administered 2023-09-26: 100 mL via INTRAVENOUS

## 2023-09-27 NOTE — Progress Notes (Signed)
 CT scan shows prostate enlargement and pockets in the colon consistent with diverticulosis but no diverticulitis infection no other abnormality thankfully  Given recent symptoms and concerns I recommend follow-up with your PCP Dr. Susann Givens to discuss your recent visit

## 2023-10-03 ENCOUNTER — Ambulatory Visit (HOSPITAL_BASED_OUTPATIENT_CLINIC_OR_DEPARTMENT_OTHER): Payer: Medicare Other | Admitting: Student

## 2023-10-04 ENCOUNTER — Encounter: Payer: Self-pay | Admitting: Family Medicine

## 2023-10-04 ENCOUNTER — Ambulatory Visit (INDEPENDENT_AMBULATORY_CARE_PROVIDER_SITE_OTHER): Admitting: Family Medicine

## 2023-10-04 ENCOUNTER — Ambulatory Visit (INDEPENDENT_AMBULATORY_CARE_PROVIDER_SITE_OTHER): Admitting: Neurology

## 2023-10-04 ENCOUNTER — Encounter: Payer: Self-pay | Admitting: Neurology

## 2023-10-04 VITALS — BP 130/82 | HR 88 | Wt 146.8 lb

## 2023-10-04 VITALS — BP 133/89 | HR 86 | Ht 71.0 in | Wt 147.0 lb

## 2023-10-04 DIAGNOSIS — R2681 Unsteadiness on feet: Secondary | ICD-10-CM | POA: Diagnosis not present

## 2023-10-04 DIAGNOSIS — F1090 Alcohol use, unspecified, uncomplicated: Secondary | ICD-10-CM

## 2023-10-04 DIAGNOSIS — G629 Polyneuropathy, unspecified: Secondary | ICD-10-CM

## 2023-10-04 DIAGNOSIS — M47812 Spondylosis without myelopathy or radiculopathy, cervical region: Secondary | ICD-10-CM | POA: Diagnosis not present

## 2023-10-04 DIAGNOSIS — R269 Unspecified abnormalities of gait and mobility: Secondary | ICD-10-CM | POA: Diagnosis not present

## 2023-10-04 NOTE — Progress Notes (Signed)
 [facility]rology [facility] Clinic Note - Initial Visit   Date: 10/04/2023   Micheal Leblanc MRN: [id number] DOB: 1951/02/09   Dear Dr. Susann Givens:  Thank you for your kind referral of Micheal Leblanc for consultation of falls and leg weakness. Although his history is well known to you, please allow [place] to reiterate it for the purpose of our medical record. The patient was accompanied to the clinic by son who also provides collateral information.     Micheal Leblanc is a 73 y.o. right-handed male with hypertension, hyperlipidemia, diabetes mellitus, and alcohol use presenting for evaluation of falls and leg weakness.   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  Exam shows distal sensory loss, areflexia, and sensory ataxia.  Risk factors:  diabetes, alcohol, age.  I will check NCS/EMG of the legs to further evaluate his symptoms.  If there is evidence of neuropathy, additional labs will be checked.  For imbalance, referral placed for PT to work on gait training.  Advised to cut back on alcohol.   Return to clinic in 3-4 months, or sooner as needed  ------------------------------------------------------------- History of present illness: Starting around November 2024, he began having difficulty with balance and leg weakness.  Symptoms have been ongoing since this time.  He has some numbness in the legs.  No tingling or pain. He denies low back pain or shooting pain down the legs.    He drinks 3 glasses of liquor on the weekends.  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  He lives with son in a two level home.    Out-side paper records, electronic medical record, and images have been reviewed where available and summarized as:  Lab Results  Component Value Date   HGBA1C 5.7 (H) 08/12/2023   Lab Results  Component Value Date   VITAMINB12 683 08/12/2023   Lab Results  Component Value Date   TSH 3.200 08/12/2023     Past Medical History:  Diagnosis Date   Allergy    Diabetes mellitus    ED (erectile dysfunction)    Hx of colonic polyps    Hyperlipidemia    Hypertension     Past Surgical History:  Procedure Laterality Date   COLONOSCOPY     POLYPECTOMY       Medications:  Outpatient Encounter Medications as of 10/04/2023  Medication Sig Note   Accu-Chek Softclix Lancets lancets Use as instructed    atorvastatin (LIPITOR) 20 MG tablet TAKE 1 TABLET BY MOUTH EVERY DAY    glucose blood test strip Use as instructed    insulin glargine (LANTUS SOLOSTAR) 100 UNIT/ML Solostar Pen Inject 55 Units into the skin daily. (Patient taking differently: Inject 50 Units into the skin daily.)    Insulin Pen Needle (PEN NEEDLES) 31G X 8 MM MISC Use this for lantus solorstar pen    metFORMIN (GLUCOPHAGE-XR) 750 MG 24 hr tablet Take 1 tablet (750 mg total) by mouth 2 (two) times daily. (Patient taking differently: Take 750 mg by mouth 2 (two) times daily.) 07/09/2021: Pharmacy record says 750 mg 2 times daily   Olmesartan-amLODIPine-HCTZ (TRIBENZOR) 40-5-25 MG TABS Take 1 tablet by mouth daily.    pantoprazole (PROTONIX) 40 MG tablet Take 1 tablet (40 mg total) by mouth daily.    pioglitazone (ACTOS) 30 MG tablet TAKE 1 TABLET BY MOUTH EVERY DAY    meclizine (ANTIVERT) 25 MG tablet Take 1 tablet (25 mg total) by mouth 2 (two) times  daily. (Patient not taking: Reported on 10/04/2023)    No facility-administered encounter medications on file as of 10/04/2023.    Allergies: No Known Allergies  Family History: Family History  Problem Relation Age of Onset   Arthritis Mother    Colon cancer Neg Hx    Rectal cancer Neg Hx    Stomach cancer Neg Hx    Colon polyps Neg Hx    Diabetes Neg Hx     Social History: Social History   Tobacco Use   Smoking status: Former    Current packs/day: 0.00    Types: Cigarettes    Quit date: 04/06/2021    Years since quitting: 2.4   Smokeless tobacco: Never  Vaping Use    Vaping status: Never Used  Substance Use Topics   Alcohol use: Not Currently    Alcohol/week: 4.0 standard drinks of alcohol    Types: 2 Cans of beer, 2 Shots of liquor per week    Comment: no change 3-4 on the weekend.   Drug use: No   Social History   Social History Narrative   Are you right handed or left handed? Right Handed   Are you currently employed ? Yes   What is your current occupation? On sick leave    Do you live at home alone? No    Who lives with you? Son    What type of home do you live in: 1 story or 2 story? Lives in a two story home        Vital Signs:  BP 133/89   Pulse 86   Ht 5\' 11"  (1.803 m)   Wt 147 lb (66.7 kg)   SpO2 97%   BMI 20.50 kg/m    Neurological Exam: MENTAL STATUS including orientation to time, place, person, recent and remote memory, attention span and concentration, language, and fund of knowledge is normal.  Speech is not dysarthric.  CRANIAL NERVES: II:  No visual field defects.     III-IV-VI: Pupils equal round and reactive to light.  Normal conjugate, extra-ocular eye movements in all directions of gaze.  No nystagmus.  No ptosis.   V:  Normal facial sensation.    VII:  Normal facial symmetry and movements.   VIII:  Normal hearing and vestibular function.   IX-X:  Normal palatal movement.   XI:  Normal shoulder shrug and head rotation.   XII:  Normal tongue strength and range of motion, no deviation or fasciculation.  MOTOR:  No atrophy, fasciculations or abnormal movements.  No pronator drift.   Upper Extremity:  Right  Left  Deltoid  5/5   5/5   Biceps  5/5   5/5   Triceps  5/5   5/5   Wrist extensors  5/5   5/5   Wrist flexors  5/5   5/5   Finger extensors  5/5   5/5   Finger flexors  5/5   5/5   Dorsal interossei  5/5   5/5   Abductor pollicis  5/5   5/5   Tone (Ashworth scale)  0  0   Lower Extremity:  Right  Left  Hip flexors  5/5   5/5   Hip extensors  5/5   5/5   Knee flexors  5/5   5/5   Knee extensors   5/5   5/5   Dorsiflexors  5/5   5/5   Plantarflexors  5/5   5/5   Toe extensors  5/5  5/5   Toe flexors  5/5   5/5   Tone (Ashworth scale)  0  0   MSRs:                                           Right        Left brachioradialis 2+  2+  biceps 2+  2+  triceps 2+  2+  patellar 0  0  ankle jerk 0  0  plantar response mute  mute   SENSORY:  Absent vibration below the ankles. Reduced pin prick and temperature distal to mid-legs.   Romberg's sign present.   COORDINATION/GAIT: Normal finger-to- nose-finger.  Intact rapid alternating movements bilaterally.  Gait is mildly wide-based, unassisted, unsteady with turns.  Unable to perform tandem gait.    Thank you for allowing me to participate in patient's care.  If I can answer any additional questions, I would be pleased to do so.    Sincerely,    Rishaan Gunner K. Allena Katz, DO

## 2023-10-04 NOTE — Patient Instructions (Signed)
 Nerve testing of the legs  We will refer you to physical therapy for balance training  ELECTROMYOGRAM AND NERVE CONDUCTION STUDIES (EMG/NCS) INSTRUCTIONS  How to Prepare The neurologist conducting the EMG will need to know if you have certain medical conditions. Tell the neurologist and other EMG lab personnel if you: Have a pacemaker or any other electrical medical device Take blood-thinning medications Have hemophilia, a blood-clotting disorder that causes prolonged bleeding Bathing Take a shower or bath shortly before your exam in order to remove oils from your skin. Don't apply lotions or creams before the exam.  What to Expect You'll likely be asked to change into a hospital gown for the procedure and lie down on an examination table. The following explanations can help you understand what will happen during the exam.  Electrodes. The neurologist or a technician places surface electrodes at various locations on your skin depending on where you're experiencing symptoms. Or the neurologist may insert needle electrodes at different sites depending on your symptoms.  Sensations. The electrodes will at times transmit a tiny electrical current that you may feel as a twinge or spasm. The needle electrode may cause discomfort or pain that usually ends shortly after the needle is removed. If you are concerned about discomfort or pain, you may want to talk to the neurologist about taking a short break during the exam.  Instructions. During the needle EMG, the neurologist will assess whether there is any spontaneous electrical activity when the muscle is at rest - activity that isn't present in healthy muscle tissue - and the degree of activity when you slightly contract the muscle.  He or she will give you instructions on resting and contracting a muscle at appropriate times. Depending on what muscles and nerves the neurologist is examining, he or she may ask you to change positions during the exam.   After your EMG You may experience some temporary, minor bruising where the needle electrode was inserted into your muscle. This bruising should fade within several days. If it persists, contact your primary care doctor.

## 2023-10-04 NOTE — Progress Notes (Signed)
   Subjective:    Patient ID: Micheal Leblanc, male    DOB: Sep 27, 1950, 73 y.o.   MRN: [id number]  HPI He is here for a recheck.  He did have x-rays on his cervical spine as well as then then seen at [facility] for his shoulder and apparently did have an injection and sent for physical therapy.  He did not get his therapy set up as he did not return their call.  He was seen by neurology today and is scheduled for follow-up evaluation and physical therapy for gait and balance and also neuropathy.  The note also recommended cutting back on alcohol.  He does have 1 to 2/day but more on weekends.  Today he states that his neck is just feeling weak but no numbness, tingling or weakness down his arms.  Review of Systems     Objective:    Physical Exam Alert and in no distress.  The x-rays and note from neurology was evaluated as well as the note from orthopedics.       Assessment & Plan:  Alcohol use  Cervical spondylosis  Gait disturbance

## 2023-10-05 ENCOUNTER — Telehealth (HOSPITAL_BASED_OUTPATIENT_CLINIC_OR_DEPARTMENT_OTHER): Payer: Self-pay | Admitting: Student

## 2023-10-05 NOTE — Telephone Encounter (Signed)
 Called patient to get an appointment sch. Patient stated that he came in on yesterday after hours at 5 to sch an appointment.

## 2023-11-03 ENCOUNTER — Encounter: Payer: Self-pay | Admitting: Neurology

## 2023-11-03 ENCOUNTER — Encounter: Admitting: Neurology

## 2023-11-06 NOTE — Therapy (Addendum)
 OUTPATIENT PHYSICAL THERAPY CERVICAL EVALUATION   Patient Name: Micheal Leblanc MRN: [id number] DOB:1951/04/01, 73 y.o., male Today's Date: 11/08/2023  END OF SESSION:  PT End of Session - 11/07/23 1411     Visit Number 1    Number of Visits 16   Date for PT Re-Evaluation 01/02/2024    Authorization Type [employer] MCR    PT Start Time 1410    PT Stop Time 1501    PT Time Calculation (min) 51 min    Activity Tolerance Patient tolerated treatment well    Behavior During Therapy [facility] for tasks assessed/performed             Past Medical History:  Diagnosis Date   Allergy    Diabetes mellitus    ED (erectile dysfunction)    Hx of colonic polyps    Hyperlipidemia    Hypertension    Past Surgical History:  Procedure Laterality Date   COLONOSCOPY     POLYPECTOMY     Patient Active Problem List   Diagnosis Date Noted   Alcohol use 08/12/2023   Former smoker 08/12/2023   Screening for prostate cancer 07/20/2023   Diabetic nephropathy associated with type 2 diabetes mellitus (HCC) 12/21/2022   Allergic rhinitis 03/21/2020   Hyperlipidemia associated with type 2 diabetes mellitus (HCC) 04/17/2014   Arthritis 08/26/2011   Diverticulosis 12/16/2010   Colonic polyp 12/16/2010   Hypogonadism, male 12/16/2010   Hypertension complicating diabetes (HCC) 12/16/2010   Controlled diabetes mellitus type 2 with complications (HCC) 12/16/2010    PCP: Watson Hacking, MD  REFERRING PROVIDER:  Watson Hacking, MD  Patel, Donika K, DO  REFERRING DIAG: [phone] (ICD-10-CM) - Cervical spondylosis  G62.9 (ICD-10-CM) - Neuropathy  R26.81 (ICD-10-CM) - Unsteady gait    THERAPY DIAG:  Other abnormalities of gait and mobility  Muscle weakness (generalized)  Other symptoms and signs involving the musculoskeletal system  Bilateral shoulder pain, unspecified chronicity  Rationale for Evaluation and Treatment: Rehabilitation  ONSET DATE: Sept/Oct 2024 (Gait/mobility  deficits)  SUBJECTIVE:                                                                                                                                                                                                         SUBJECTIVE STATEMENT: Pt has seen multiple MD's and has PT scripts for multiple MD's.   MD note from neurology indicated Gait imbalance most likely contributed by neuropathy and order indicated Balance Training.  Pt states he had an EMG/NCV.  Pt states he became sick in Sept/Oct last year  which affected his mobility.  He states his mobility has been worsening ever since.  Pt's sister states he didn't use to walk like that.  He has difficulty with car transfers.  Pt is limited with standing duration.  Pt states he can't walk too far.  Pt has difficulty with stairs.  Pt has a fear of falling with walking and stairs.   Pt states his L shoulder started hurting a couple of years ago before the fall.  Pt had a fall fracturing his L clavicle.  His pain transferred to the R shoulder later.  Pt states he has difficulty lifting bilat Ue's.  Pt reports having soreness in neck though not pain.  Pt had a R subacromial cortisone injection in Feb which improved his pain.    Pt's sister is present during the evaluation.   Hand dominance: Right  PERTINENT HISTORY:  Fall risk, neuropathy DM type 2, HTN Bilat Shoulder pain-Hx of L distal clavicle fx  PAIN:  R > L shoulder pain:  5/10 current, 3/10 best, 10/10 worst Pt denies N/T though does report a throbbing pain down R UE to mid forearm.   Pt denies cervical pain.    PRECAUTIONS: Fall      WEIGHT BEARING RESTRICTIONS: No  FALLS:  Has patient fallen in last 6 months? Yes. Number of falls 1 due to his legs giving out  LIVING ENVIRONMENT: Lives with: son lives with hime Lives in: 2 story home Stairs: yes Has following equipment at home: [facility]   PLOF: Independent  PATIENT GOALS: to reduce pain, improve walking and  mobility   OBJECTIVE:  Note: Objective measures were completed at Evaluation unless otherwise noted.  DIAGNOSTIC FINDINGS:  Cervical X ray: FINDINGS: No fracture. There is trace retrolisthesis C3 on C4 and straightening of the normal cervical lordosis. Marked loss of disc space height is seen at C3-4, C5-6 and C6-7. Multilevel facet degenerative change is noted. No pathologic motion with flexion or extension. Range of motion on extension is limited. Prevertebral soft tissues appear normal. Lung apices are clear.   IMPRESSION: 1. Multilevel cervical spondylosis appears worst at C3-4, C5-6 and C6-7. 2. No pathologic motion with flexion or extension. Range of motion on extension is limited.  R shoulder X ray:   IMPRESSION: Mild to moderate acromioclavicular osteoarthritis. The exam is otherwise negative.  L shoulder X ray:  IMPRESSION: No evidence of acute fracture or dislocation. Interval healing of previously demonstrated distal left clavicle fracture with associated posttraumatic deformity.  PATIENT SURVEYS:  NDI 16  COGNITION: Overall cognitive status: Within functional limits for tasks assessed    CERVICAL ROM:   Active ROM A/PROM (deg) eval  Flexion WFL  Extension 28  Right lateral flexion 7  Left lateral flexion 8  Right rotation 32  Left rotation 35   (Blank rows = not tested)   STRENGTH TESTING:  MMT/HHD Right eval Left eval  Shoulder flexion    Shoulder extension    Shoulder abduction    Shoulder adduction    Shoulder extension    Shoulder internal rotation    Shoulder external rotation    Middle trapezius    Lower trapezius    Elbow flexion    Elbow extension    Wrist flexion    Wrist extension    Wrist ulnar deviation    Wrist radial deviation    Hip flexion 4/5 4+/5  Knee extension 4-5 4-/5  Hip abduction 22.8 22.6   (Blank rows = not tested)  FUNCTIONAL TESTS:  Pt has difficulty with sit/stand transfers and requires UE  support.   5x STS test:  39.24 sec with UE support TUG:  25.83  GAIT:  Pt has decreased step length bilat.  Slow gait pattern.  Decreased foot clearance bilat.  Forward flexed posture with gait.  TREATMENT:                                                                                                                               Pt performed seated scapular retraction.  Pt also performed seated cervical rotation AROM 2 sets of 5 reps.  He received a HEP handout and was educated in correct form and appropriate frequency.    PATIENT EDUCATION:  Education details: dx, objective findings, POC, rationale of interventions, HEP, what to expect next Rx, and relevant anatomy.   Person educated: Patient and sister Education method: Explanation, Demonstration, Tactile cues, Verbal cues, and Handouts Education comprehension: verbalized understanding, returned demonstration, verbal cues required, tactile cues required, and needs further education  HOME EXERCISE PROGRAM: Access Code: [email or web address] URL: [email or web address] Date: 11/08/2023 Prepared by: Marnie Siren  Exercises - Seated Scapular Retraction  - 2 x daily - 7 x weekly - 2 sets - 10 reps - Seated Cervical Rotation AROM  - 2 x daily - 7 x weekly - 2 sets - 5 reps  ASSESSMENT:  CLINICAL IMPRESSION: Patient is a 73 y.o. male with dx's of cervical spondylosis, gait instability, and neuropathy.  Pt states he became sick in Sept/Oct last year which affected his mobility.  He reports worsening mobility ever since being sick.  Pt has difficulty with functional mobility skills including ambulation, transfers, and stairs.  Pt is limited with standing duration and ambulation distance.  Pt has a fear of falling with walking and stairs.  Pt reports R > L shoulder pain and difficulty lifting bilat Ue's.  Pt had a subacromial cortisone injection in R shoulder in Feb which improved his pain.  Pt has decreased cervical ROM, bilat LE weakness,  and gait deficits.  Pt is at fall risk as evidenced by scores on the TUG and 5x STS test.  Pt should benefit from skilled PT to address impairments and to improve overall function.   OBJECTIVE IMPAIRMENTS: Abnormal gait, decreased activity tolerance, decreased balance, decreased coordination, decreased endurance, decreased mobility, difficulty walking, decreased ROM, decreased strength, impaired flexibility, impaired UE functional use, and pain.   ACTIVITY LIMITATIONS: standing, squatting, stairs, transfers, reach over head, and locomotion level  PARTICIPATION LIMITATIONS: shopping and community activity  PERSONAL FACTORS: 1 comorbidity: DM type 2 are also affecting patient's functional outcome.   REHAB POTENTIAL: Good  CLINICAL DECISION MAKING: Stable/uncomplicated  EVALUATION COMPLEXITY: Low   GOALS:  SHORT TERM GOALS: Target date:  12/05/2023   Pt will be independent and compliant with HEP for improved pain, strength, ROM, mobility, and function.  Baseline:  Goal status: INITIAL  2.  Pt will perform 5x STS  test in no > 30 sec for improved functional LE strength and performance of transfers.  Baseline:  Goal status: INITIAL  3.  Pt will demo at least a 5 sec improvement in TUG time Baseline:  Goal status: INITIAL  4.  Pt will demo at least a 10 deg increase in bilat Sb'ing and rotation cervical AROM for improved mobility and stiffness.  Baseline:  Goal status: INITIAL    LONG TERM GOALS: Target date: 01/02/2024  Pt will demonstrate improved quality of gait and report improved steadiness with his normal ambulation.  Baseline:  Goal status: INITIAL  2.  Pt will perform TUG in no > 10 sec for decreased fall risk and improved mobility.  Baseline:  Goal status: INITIAL  3.  Pt will perform 5x STS test in less than 20 sec for improved functional LE strength and performance of transfers. Baseline:  Goal status: INITIAL  4.  Pt will demo improved knee extension strength to  5/5 MMT and hip abd strength by 6-8 lbs bilat for improved performance of functional mobility.  Baseline:  Goal status: INITIAL  5.  Pt will report he is able to ambulate community distance with good stability without significant difficulty.   Baseline:  Goal status: INITIAL  6.  Pt will report he is able to reach overhead into cabinets without significant difficulty.   Baseline:  Goal status: INITIAL   PLAN:  PT FREQUENCY: 2x/week  PT DURATION: 8 weeks  PLANNED INTERVENTIONS: 97164- PT Re-evaluation, 97750- Physical Performance Testing, 97110-Therapeutic exercises, 97530- Therapeutic activity, [id number]- Neuromuscular re-education, 97535- Self Care, 14782- Manual therapy, [phone]- Gait training, [phone]- Aquatic Therapy, [phone]- Electrical stimulation (unattended), [phone]- Ultrasound, Patient/Family education, Balance training, Stair training, Taping, Dry Needling, Joint mobilization, Spinal mobilization, DME instructions, Cryotherapy, and Moist heat  PLAN FOR NEXT SESSION: review and perform HEP.  Cont with LE and postural strengthening, balance training, and gait.  Sit to stands.  Rows with theraband.  Assess shoulder flexion ROM.    Date of referral: 10/04/2023 Referring provider:  Watson Hacking, MD  Patel, Donika K, DO   Referring diagnosis?  M47.812 (ICD-10-CM) - Cervical spondylosis  G62.9 (ICD-10-CM) - Neuropathy  R26.81 (ICD-10-CM) - Unsteady gait   Treatment diagnosis? (if different than referring diagnosis)  Other abnormalities of gait and mobility  Muscle weakness (generalized)  Other symptoms and signs involving the musculoskeletal system  Bilateral shoulder pain, unspecified chronicity  What was this (referring dx) caused by? Ongoing Issue and Unspecified  Nature of Condition: Chronic (continuous duration > 3 months)   Laterality: Both  Current Functional Measure Score: Neck Index 16  Objective measurements identify impairments when they are compared to normal  values, the uninvolved extremity, and prior level of function.  [x]  Yes  []  No  Objective assessment of functional ability: Moderate functional limitations   Briefly describe symptoms: gait and balance deficits, shoulder pain, LE weakness, and decreased cervical AROM  How did symptoms start: Pt states he became sick in Sept/Oct last year which affected his mobility.  He states his mobility has been worsening ever since.  Pt states his L shoulder started hurting a couple of years ago before the fall.  Pt had a fall fracturing his L clavicle.  His pain transferred to the R shoulder later.     Average pain intensity:  R > L shoulder pain:  5/10 current, 3/10 best, 10/10 worst  How often does the pt experience symptoms? Constantly  How much have the symptoms interfered with  usual daily activities? Extremely  How has condition changed since care began at this facility? NA - initial visit  In general, how is the patients overall health?    BACK PAIN (STarT Back Screening Tool) No   Trina Fujita III PT, DPT 11/08/23 11:36 PM  PHYSICAL THERAPY DISCHARGE SUMMARY  Visits from Start of Care: 1  Current functional level related to goals / functional outcomes: Unable to assess due to pt not being present at discharge.     Remaining deficits: Unable to assess    Education / Equipment: See above   Patient was only seen for the initial evaluation.  Pt's son called and cancelled his PT app'ts stating he will be doing HHPT instead.  Pt will be discharged from skilled PT due to receiving HHPT instead of OPPT.  Trina Fujita III PT, DPT 12/09/23 3:50 PM

## 2023-11-07 ENCOUNTER — Ambulatory Visit (HOSPITAL_BASED_OUTPATIENT_CLINIC_OR_DEPARTMENT_OTHER): Attending: Family Medicine | Admitting: Physical Therapy

## 2023-11-07 ENCOUNTER — Telehealth (HOSPITAL_BASED_OUTPATIENT_CLINIC_OR_DEPARTMENT_OTHER): Payer: Self-pay | Admitting: Student

## 2023-11-07 ENCOUNTER — Encounter (HOSPITAL_BASED_OUTPATIENT_CLINIC_OR_DEPARTMENT_OTHER): Payer: Self-pay | Admitting: Physical Therapy

## 2023-11-07 DIAGNOSIS — R29898 Other symptoms and signs involving the musculoskeletal system: Secondary | ICD-10-CM

## 2023-11-07 DIAGNOSIS — R2689 Other abnormalities of gait and mobility: Secondary | ICD-10-CM

## 2023-11-07 DIAGNOSIS — M6281 Muscle weakness (generalized): Secondary | ICD-10-CM

## 2023-11-07 DIAGNOSIS — M25511 Pain in right shoulder: Secondary | ICD-10-CM

## 2023-11-07 DIAGNOSIS — M47812 Spondylosis without myelopathy or radiculopathy, cervical region: Secondary | ICD-10-CM | POA: Diagnosis not present

## 2023-11-07 DIAGNOSIS — M25512 Pain in left shoulder: Secondary | ICD-10-CM | POA: Insufficient documentation

## 2023-11-07 NOTE — Telephone Encounter (Signed)
 Patient states that he is still having pain in his shoulder. He is currently in PT

## 2023-11-08 ENCOUNTER — Other Ambulatory Visit: Payer: Self-pay

## 2023-11-09 ENCOUNTER — Emergency Department (HOSPITAL_COMMUNITY)

## 2023-11-09 ENCOUNTER — Ambulatory Visit: Payer: Self-pay | Admitting: Family Medicine

## 2023-11-09 ENCOUNTER — Emergency Department (HOSPITAL_COMMUNITY)
Admission: EM | Admit: 2023-11-09 | Discharge: 2023-11-10 | Disposition: A | Discharge status: Home Health | Attending: Emergency Medicine | Admitting: Emergency Medicine

## 2023-11-09 DIAGNOSIS — S0990XA Unspecified injury of head, initial encounter: Secondary | ICD-10-CM | POA: Insufficient documentation

## 2023-11-09 DIAGNOSIS — Z794 Long term (current) use of insulin: Secondary | ICD-10-CM | POA: Diagnosis not present

## 2023-11-09 DIAGNOSIS — Z7984 Long term (current) use of oral hypoglycemic drugs: Secondary | ICD-10-CM | POA: Insufficient documentation

## 2023-11-09 DIAGNOSIS — W19XXXA Unspecified fall, initial encounter: Secondary | ICD-10-CM

## 2023-11-09 DIAGNOSIS — R531 Weakness: Secondary | ICD-10-CM | POA: Diagnosis not present

## 2023-11-09 DIAGNOSIS — I10 Essential (primary) hypertension: Secondary | ICD-10-CM | POA: Insufficient documentation

## 2023-11-09 DIAGNOSIS — E119 Type 2 diabetes mellitus without complications: Secondary | ICD-10-CM | POA: Insufficient documentation

## 2023-11-09 DIAGNOSIS — W01198A Fall on same level from slipping, tripping and stumbling with subsequent striking against other object, initial encounter: Secondary | ICD-10-CM | POA: Insufficient documentation

## 2023-11-09 DIAGNOSIS — R2689 Other abnormalities of gait and mobility: Secondary | ICD-10-CM | POA: Diagnosis not present

## 2023-11-09 DIAGNOSIS — M47812 Spondylosis without myelopathy or radiculopathy, cervical region: Secondary | ICD-10-CM | POA: Diagnosis not present

## 2023-11-09 DIAGNOSIS — S199XXA Unspecified injury of neck, initial encounter: Secondary | ICD-10-CM | POA: Diagnosis not present

## 2023-11-09 DIAGNOSIS — Z79899 Other long term (current) drug therapy: Secondary | ICD-10-CM | POA: Insufficient documentation

## 2023-11-09 DIAGNOSIS — M4802 Spinal stenosis, cervical region: Secondary | ICD-10-CM | POA: Diagnosis not present

## 2023-11-09 DIAGNOSIS — G9389 Other specified disorders of brain: Secondary | ICD-10-CM | POA: Diagnosis not present

## 2023-11-09 LAB — CBC WITH DIFFERENTIAL/PLATELET
Abs Immature Granulocytes: 0.01 10*3/uL (ref 0.00–0.07)
Basophils Absolute: 0 10*3/uL (ref 0.0–0.1)
Basophils Relative: 0 %
Eosinophils Absolute: 0 10*3/uL (ref 0.0–0.5)
Eosinophils Relative: 0 %
HCT: 49.4 % (ref 39.0–52.0)
Hemoglobin: 16.4 g/dL (ref 13.0–17.0)
Immature Granulocytes: 0 %
Lymphocytes Relative: 28 %
Lymphs Abs: 1.5 10*3/uL (ref 0.7–4.0)
MCH: 28.7 pg (ref 26.0–34.0)
MCHC: 33.2 g/dL (ref 30.0–36.0)
MCV: 86.5 fL (ref 80.0–100.0)
Monocytes Absolute: 0.4 10*3/uL (ref 0.1–1.0)
Monocytes Relative: 8 %
Neutro Abs: 3.3 10*3/uL (ref 1.7–7.7)
Neutrophils Relative %: 64 %
Platelets: 137 10*3/uL — ABNORMAL LOW (ref 150–400)
RBC: 5.71 MIL/uL (ref 4.22–5.81)
RDW: 13.5 % (ref 11.5–15.5)
WBC: 5.3 10*3/uL (ref 4.0–10.5)
nRBC: 0 % (ref 0.0–0.2)

## 2023-11-09 LAB — COMPREHENSIVE METABOLIC PANEL WITH GFR
ALT: 15 U/L (ref 0–44)
AST: 16 U/L (ref 15–41)
Albumin: 3.7 g/dL (ref 3.5–5.0)
Alkaline Phosphatase: 61 U/L (ref 38–126)
Anion gap: 11 (ref 5–15)
BUN: 15 mg/dL (ref 8–23)
CO2: 23 mmol/L (ref 22–32)
Calcium: 10 mg/dL (ref 8.9–10.3)
Chloride: 105 mmol/L (ref 98–111)
Creatinine, Ser: 0.92 mg/dL (ref 0.61–1.24)
GFR, Estimated: >60 mL/min (ref >60)
Glucose, Bld: 109 mg/dL — ABNORMAL HIGH (ref 70–99)
Potassium: 3.8 mmol/L (ref 3.5–5.1)
Sodium: 139 mmol/L (ref 135–145)
Total Bilirubin: 1.1 mg/dL (ref 0.0–1.2)
Total Protein: 6 g/dL — ABNORMAL LOW (ref 6.5–8.1)

## 2023-11-09 LAB — ETHANOL: Alcohol, Ethyl (B): <15 mg/dL (ref <15)

## 2023-11-09 LAB — CK: Total CK: 84 U/L (ref 49–397)

## 2023-11-09 MED ORDER — HYDROCHLOROTHIAZIDE 25 MG PO TABS
25.0000 mg | ORAL_TABLET | Freq: Every day | ORAL | Status: DC
  Administered 2023-11-09 – 2023-11-10 (×2): 25 mg via ORAL
  Filled 2023-11-09 (×2): qty 1

## 2023-11-09 MED ORDER — AMLODIPINE BESYLATE 5 MG PO TABS
5.0000 mg | ORAL_TABLET | Freq: Once | ORAL | Status: AC
  Administered 2023-11-09: 5 mg via ORAL
  Filled 2023-11-09: qty 1

## 2023-11-09 MED ORDER — IRBESARTAN 300 MG PO TABS
300.0000 mg | ORAL_TABLET | Freq: Every day | ORAL | Status: DC
  Administered 2023-11-09 – 2023-11-10 (×2): 300 mg via ORAL
  Filled 2023-11-09 (×2): qty 1

## 2023-11-09 NOTE — Telephone Encounter (Signed)
 Spoke to pt.  Pt is having balance issues, dizziness. Fell yesterday and this morning. Feels like he is having slurred speech and right sided face drooping. He has been a nauseated all day and vomited earlier.   No Chest pain or SOB.  Spoke with Dr. Robina Chol and patient was told to go to ER to be seen and not wait until visit tomorrow.  Patient asked me to call son to inform him. Called son, he is in the middle of moving and said he would be back home soon to take him. Advised he could call 911 but didn't want to pay if it cost extra.   Sending to [employer] as FYI

## 2023-11-09 NOTE — ED Triage Notes (Signed)
 Patient BIB [facility] from home for weakness. Patient tried to get up and fell then stayed in the floor for 3-5 hours. Family reports dark brown urine, urinary frequency, decreased PO intake and appetite.  BP 178/106 HR 64 R 17 98%RA CBG 111 99.2F

## 2023-11-09 NOTE — ED Provider Notes (Signed)
 [place] EMERGENCY DEPARTMENT AT [facility] Provider Note   CSN: [id number] Arrival date & time: 11/09/23  1714     History  Chief Complaint  Patient presents with   Weakness    Micheal Leblanc is a 73 y.o. male.  73 year old male with history of gait instability thought to be due to neuropathy, heavy alcohol use, diabetes, hypertension, and hyperlipidemia who presents to the emergency department with frequent falls.  Patient reports that for several months he has been having frequent falls where his legs will give weak and he will go to the ground.  Had 2 falls with 1 today where he did hit his head.  Was reportedly on the ground for several hours.  Denies pain at this time.  No LOC.  No preceding symptoms.  Is being followed by neurology and has a nerve conduction study ordered.  Had a normal B12 and folate as part of his workup.  Had an MRI of the brain around the time of his symptoms starting.  Lives at home with his son.       Home Medications Prior to Admission medications   Medication Sig Start Date End Date Taking? Authorizing Provider  Accu-Chek Softclix Lancets lancets Use as instructed 03/30/21   Watson Hacking, MD  atorvastatin  (LIPITOR) 20 MG tablet TAKE 1 TABLET BY MOUTH EVERY DAY 07/28/23   Lalonde, John C, MD  glucose blood test strip Use as instructed 12/22/22   Watson Hacking, MD  insulin  glargine (LANTUS  SOLOSTAR) 100 UNIT/ML Solostar Pen Inject 55 Units into the skin daily. Patient taking differently: Inject 50 Units into the skin daily. 12/21/22 10/04/23  Watson Hacking, MD  Insulin  Pen Needle (PEN NEEDLES) 31G X 8 MM MISC Use this for lantus  solorstar pen 12/12/18   Wieters, Hallie C, PA-C  meclizine  (ANTIVERT ) 25 MG tablet Take 1 tablet (25 mg total) by mouth 2 (two) times daily. 06/15/23   Watson Hacking, MD  metFORMIN  (GLUCOPHAGE -XR) 750 MG 24 hr tablet Take 1 tablet (750 mg total) by mouth 2 (two) times daily. Patient taking differently: Take 750  mg by mouth 2 (two) times daily. 12/12/20   Watson Hacking, MD  Olmesartan -amLODIPine -HCTZ (TRIBENZOR) 40-5-25 MG TABS Take 1 tablet by mouth daily. 12/08/20   Lalonde, John C, MD  pantoprazole  (PROTONIX ) 40 MG tablet Take 1 tablet (40 mg total) by mouth daily. 07/11/21 10/04/23  Pokhrel, Laxman, MD  pioglitazone  (ACTOS ) 30 MG tablet TAKE 1 TABLET BY MOUTH EVERY DAY Patient not taking: Reported on 11/07/2023 06/02/21   Watson Hacking, MD      Allergies    Patient has no known allergies.    Review of Systems   Review of Systems  Physical Exam Updated Vital Signs BP (!) 190/100   Pulse (!) 54   Temp 98 F (36.7 C) (Oral)   Resp 15   SpO2 100%  Physical Exam Vitals and nursing note reviewed.  Constitutional:      General: He is not in acute distress.    Appearance: Normal appearance. He is well-developed. He is not ill-appearing.  HENT:     Head: Normocephalic and atraumatic.     Right Ear: External ear normal.     Left Ear: External ear normal.     Nose: Nose normal.     Mouth/Throat:     Mouth: Mucous membranes are moist.     Pharynx: Oropharynx is clear.  Eyes:     Extraocular Movements:  Extraocular movements intact.     Conjunctiva/sclera: Conjunctivae normal.     Pupils: Pupils are equal, round, and reactive to light.  Neck:     Comments: Midline tenderness to palpation at C7 Cardiovascular:     Rate and Rhythm: Normal rate and regular rhythm.     Pulses: Normal pulses.     Heart sounds: Normal heart sounds.  Pulmonary:     Effort: Pulmonary effort is normal. No respiratory distress.     Breath sounds: Normal breath sounds.  Abdominal:     General: Abdomen is flat. There is no distension.     Palpations: Abdomen is soft. There is no mass.     Tenderness: There is no abdominal tenderness. There is no guarding.  Musculoskeletal:        General: No deformity. Normal range of motion.     Cervical back: Normal range of motion and neck supple. No rigidity.     Right lower  leg: No edema.     Left lower leg: No edema.     Comments: No tenderness to palpation of midline thoracic or lumbar spine.  No step-offs palpated.  No tenderness to palpation of chest wall.  No bruising noted.  No tenderness to palpation of bilateral clavicles.  No tenderness to palpation, bruising, or deformities noted of bilateral shoulders, elbows, wrists, hips, knees, or ankles.  Full range of motion of the right shoulder.  Skin:    General: Skin is warm and dry.  Neurological:     General: No focal deficit present.     Mental Status: He is alert and oriented to person, place, and time. Mental status is at baseline.     Cranial Nerves: No cranial nerve deficit.     Sensory: No sensory deficit.     Motor: No weakness.     Coordination: Coordination normal.     Comments: No aphasia or neglect.  No visual field cuts.  Psychiatric:        Mood and Affect: Mood normal.        Behavior: Behavior normal.     ED Results / Procedures / Treatments   Labs (all labs ordered are listed, but only abnormal results are displayed) Labs Reviewed  CBC WITH DIFFERENTIAL/PLATELET - Abnormal; Notable for the following components:      Result Value   Platelets 137 (*)    All other components within normal limits  COMPREHENSIVE METABOLIC PANEL WITH GFR - Abnormal; Notable for the following components:   Glucose, Bld 109 (*)    Total Protein 6.0 (*)    All other components within normal limits  CK  ETHANOL  URINALYSIS, ROUTINE W REFLEX MICROSCOPIC  RAPID URINE DRUG SCREEN, HOSP PERFORMED    EKG EKG Interpretation Date/Time:  Wednesday Nov 09 2023 18:17:17 EDT Ventricular Rate:  79 PR Interval:    QRS Duration:  85 QT Interval:  422 QTC Calculation: 484 R Axis:   74  Text Interpretation: Normal sinus rhythm RSR' in V1 or V2, right VCD or RVH baseline artifact Confirmed by Shyrl Doyne [id number]) on 11/09/2023 7:14:52 PM  Radiology CT Cervical Spine Wo Contrast Result Date:  11/09/2023 CLINICAL DATA:  Status post trauma. EXAM: CT CERVICAL SPINE WITHOUT CONTRAST TECHNIQUE: Multidetector CT imaging of the cervical spine was performed without intravenous contrast. Multiplanar CT image reconstructions were also generated. RADIATION DOSE REDUCTION: This exam was performed according to the departmental dose-optimization program which includes automated exposure control, adjustment of the mA and/or kV according  to patient size and/or use of iterative reconstruction technique. COMPARISON:  None Available. FINDINGS: Alignment: There is mild reversal of the normal cervical spine lordosis. Skull base and vertebrae: No acute fracture. No primary bone lesion or focal pathologic process. Soft tissues and spinal canal: No prevertebral fluid or swelling. No visible canal hematoma. Disc levels: Marked severity endplate sclerosis, anterior osteophyte formation and posterior bony spurring is seen throughout all levels of the cervical spine. Marked severity intervertebral disc space narrowing is seen at the levels of C3-C4, C5-C6 and C6-C7. Bilateral moderate to marked severity multilevel facet joint hypertrophy is noted. Upper chest: Negative. Other: None. IMPRESSION: Marked severity multilevel degenerative changes without evidence of an acute fracture or subluxation. Electronically Signed   By: Virgle Grime M.D.   On: 11/09/2023 19:07   CT Head Wo Contrast Result Date: 11/09/2023 CLINICAL DATA:  Status post trauma. EXAM: CT HEAD WITHOUT CONTRAST TECHNIQUE: Contiguous axial images were obtained from the base of the skull through the vertex without intravenous contrast. RADIATION DOSE REDUCTION: This exam was performed according to the departmental dose-optimization program which includes automated exposure control, adjustment of the mA and/or kV according to patient size and/or use of iterative reconstruction technique. COMPARISON:  May 02, 2023 FINDINGS: Brain: There is generalized cerebral  atrophy with widening of the extra-axial spaces and ventricular dilatation. There are areas of decreased attenuation within the white matter tracts of the supratentorial brain, consistent with microvascular disease changes. 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 Vascular: No hyperdense vessel or unexpected calcification. Skull: Normal. Negative for fracture or focal lesion. Sinuses/Orbits: No acute finding. Other: None. IMPRESSION: 1. Generalized cerebral atrophy and microvascular disease changes of the supratentorial brain. 2. No acute intracranial abnormality. Electronically Signed   By: Virgle Grime M.D.   On: 11/09/2023 19:05    Procedures Procedures    Medications Ordered in ED Medications  irbesartan (AVAPRO) tablet 300 mg (has no administration in time range)  hydrochlorothiazide (HYDRODIURIL) tablet 25 mg (25 mg Oral Given 11/09/23 1818)  amLODipine  (NORVASC) tablet 5 mg (5 mg Oral Given 11/09/23 1817)    ED Course/ Medical Decision Making/ A&P                                 Medical Decision Making Amount and/or Complexity of Data Reviewed Labs: ordered. Radiology: ordered.  Risk Prescription drug management.   MAESON REGE is a 73 y.o. male with comorbidities that complicate the patient evaluation including gait instability thought to be due to neuropathy, heavy alcohol use, diabetes, hypertension, and hyperlipidemia who presents to the emergency department with frequent falls.    Initial Ddx:  Peripheral neuropathy, Warnicke's encephalopathy, vitamin insufficiency, TBI, C-spine injury, alcohol use, rhabdomyolysis  MDM/Course:  Patient presents emergency department after a fall.  Has had frequent falls over the past few months.  Is undergoing an outpatient workup for this and is suspected to be due to peripheral neuropathy.  Has had B12 and folate that were checked that were normal.  Has had an MRI that was normal.  Has cut down on his alcohol use.   Did have another fall where he hit his head.  No other injuries as result of that fall.  Was unfortunately on the ground for prolonged period of time afterwards.  On exam does not have any focal neurologic deficits.  No obvious injuries.  Labs were checked including a CK that were unremarkable.  CT of the head and C-spine were normal.  Given the patient's frequent falls do feel that he would benefit from PT and OT evaluation so have made him a TOC border.  Awaiting urine sample at this time since he was complaining of some dark urine.  This patient presents to the ED for concern of complaints listed in HPI, this involves an extensive number of treatment options, and is a complaint that carries with it a high risk of complications and morbidity. Disposition including potential need for admission considered.   Dispo: TOC border  Additional history obtained from EMS Records reviewed Outpatient Clinic Notes The following labs were independently interpreted: Chemistry and show no acute abnormality I independently reviewed the following imaging with scope of interpretation limited to determining acute life threatening conditions related to emergency care: CT Head and agree with the radiologist interpretation with the following exceptions: none I personally reviewed and interpreted cardiac monitoring: normal sinus rhythm  I personally reviewed and interpreted the pt's EKG: see above for interpretation  I have reviewed the patients home medications and made adjustments as needed Consults: TOC Social Determinants of health:  Geriatric  Portions of this note were generated with Scientist, clinical (histocompatibility and immunogenetics). Dictation errors may occur despite best attempts at proofreading.     Final Clinical Impression(s) / ED Diagnoses Final diagnoses:  Generalized weakness  Fall, initial encounter  Minor head injury, initial encounter    Rx / DC Orders ED Discharge Orders     None         Ninetta Basket,  MD 11/09/23 [phone]

## 2023-11-09 NOTE — Telephone Encounter (Signed)
 Chief Complaint:  Neurologic s/s post fall.   Symptoms:  Headache, nausea, 1 episode of emesis.   Frequency:  N/a  Pertinent Negatives:  n/a   Patient denies  Denies LOC, bleeding.   Disposition: [ ] ED /[ ] Urgent Care (no appt availability in office) / [ ] Appointment(In office/virtual)/ [ ]  [name]/ [ ] Home Care/ [ ] Refused Recommended Disposition /[ ] [facility]/ [ ]  Follow-up with PCP   Additional Notes:  Patient was directed to call 911. Patient declined and wanted to speak to PCP.   Patient warm transferred to CAL line appropriately.   Complete triage note below:     Copied from CRM [id number]. Topic: Clinical - Red Word Triage >> Nov 09, 2023  3:34 PM Micheal Leblanc wrote: Red Word that prompted transfer to Nurse Triage: Trouble with weakness, balance, and walking. Reason for Disposition  [1] SEVERE weakness (i.e., unable to walk or barely able to walk, requires support) AND [2] new-onset or worsening  Answer Assessment - Initial Assessment Questions 1. SYMPTOM: "What is the main symptom you are concerned about?" (e.g., weakness, numbness)     -  unbalanced, bilateral leg weakness, then fell  this morning 1pm, Hit his head on the floor. Denies LOC - Head hurts: 8/10 ( after the tylenol ) -Nauseous, emesis X1  -Tylenol .   2. ONSET: "When did this start?" (minutes, hours, days; while sleeping)     -  3. LAST NORMAL: "When was the last time you (the patient) were normal (no symptoms)?"      4. PATTERN "Does this come and go, or has it been constant since it started?"  "Is it present now?"     Constant weakness   5. CARDIAC SYMPTOMS: "Have you had any of the following symptoms: chest pain, difficulty breathing, palpitations?"     Denies   6. NEUROLOGIC SYMPTOMS: "Have you had any of the following symptoms: headache, dizziness, vision loss, double vision, changes in speech, unsteady on your feet?"     Tone of voice is lower..  7. OTHER SYMPTOMS:  "Do you have any other symptoms?"     Denies  Protocols used: Neurologic Deficit-A-AH

## 2023-11-09 NOTE — TOC CM/SW Note (Signed)
 SW met with patient at bedside, patient lives with his son Arliss Benton [phone]). He stated he is still some what independent with ADL's including showering, dressing himself but he takes his time he stated. He has been having multiple falls lately at home he stated that has lead him to using his RW and cane more. He does have stairs in the home he mentioned as well he has to use the stairs to get to bedroom. Patient has neuropathy, hx of alcohol use, diabetes (insulin ), and hypertension.   Patient has consult for SNF, pending PT/OT recs.  .Steffanie Mingle, MSW, LCSWA Transition of Care  Clinical Social Worker (ED 3-11 Mon-Fri)  [phone]

## 2023-11-10 ENCOUNTER — Ambulatory Visit: Admitting: Family Medicine

## 2023-11-10 NOTE — Discharge Instructions (Addendum)
 Access [place] to apply for transportation services @ [phone]

## 2023-11-10 NOTE — Evaluation (Signed)
 Physical Therapy Evaluation Patient Details Name: Micheal Leblanc MRN: [id number] DOB: [birth year] Today's Date: 11/10/2023  History of Present Illness  73 year old male adm 5/14 with history of gait instability thought to be due to neuropathy, heavy alcohol use, diabetes, hypertension, and hyperlipidemia who presented after a fall at home.  Clinical Impression  Patient presents with decreased mobility due to generalized weakness, decreased balance and decreased activity tolerance.  Previously reports was independent with ADL/IADL's and walking without devices.  States 2 falls in past month and decline in IADL/ADL's.  Patient currently min a for transfers and CGA for ambulation with RW in hallway with posterior bias during functional task in bathroom.  Son works and pt in two level home.  Feel he may benefit from post-acute inpatient rehab (<3 hours/day) prior to d/c home.  Of note pt with two episodes of diarrhea this am.  Orthostatics as below.  Orthostatic VS for the past 24 hrs (Last 3 readings):  BP- Sitting BP- Standing at 0 minutes BP- Standing at 3 minutes  11/10/23 1000 (!) 132/96 129/80 (!) 121/95           If plan is discharge home, recommend the following: A little help with walking and/or transfers;A little help with bathing/dressing/bathroom;Assistance with cooking/housework;Assist for transportation;Help with stairs or ramp for entrance   Can travel by private vehicle   Yes    Equipment Recommendations Rolling walker (2 wheels)  Recommendations for Other Services       Functional Status Assessment Patient has had a recent decline in their functional status and demonstrates the ability to make significant improvements in function in a reasonable and predictable amount of time.     Precautions / Restrictions Precautions Precautions: Fall Precaution/Restrictions Comments: incontinent/diarrhea      Mobility  Bed Mobility Overal bed mobility: Needs Assistance Bed  Mobility: Sit to Supine       Sit to supine: Min assist   General bed mobility comments: assist for legs clearing EOB and for positioning    Transfers Overall transfer level: Needs assistance Equipment used: Rolling walker (2 wheels) Transfers: Sit to/from Stand Sit to Stand: Min assist           General transfer comment: up from recliner and up from toilet in bathroom A needed to stand    Ambulation/Gait Ambulation/Gait assistance: Contact guard assist Gait Distance (Feet): 150 Feet Assistive device: Rolling walker (2 wheels) Gait Pattern/deviations: Step-to pattern, Decreased stride length, Shuffle, Trunk flexed       General Gait Details: shuffling steps and guarded walking with RW and CGA for balance, slow pace  Stairs            Wheelchair Mobility     Tilt Bed    Modified Rankin (Stroke Patients Only)       Balance Overall balance assessment: Needs assistance Sitting-balance support: Feet supported Sitting balance-Leahy Scale: Good Sitting balance - Comments: reaching to adjust socks sitting in recliner   Standing balance support: No upper extremity supported Standing balance-Leahy Scale: Poor Standing balance comment: washing hands at sink with CGA for balance with posterior bias                             Pertinent Vitals/Pain Pain Assessment Pain Assessment: Faces Faces Pain Scale: Hurts a little bit Pain Location: neck Pain Descriptors / Indicators: Sore Pain Intervention(s): Monitored during session    Home Living Family/patient expects to be discharged to:: Private  residence Living Arrangements: Children Available Help at Discharge: Family;Available PRN/intermittently Type of Home: House Home Access: Stairs to enter Entrance Stairs-Rails: Left;Right;Can reach both Entrance Stairs-Number of Steps: 5 Alternate Level Stairs-Number of Steps: 12 Home Layout: Two level;Bed/bath upstairs Home Equipment: Standard  Walker;Cane - single point      Prior Function Prior Level of Function : Needs assist       Physical Assist : ADLs (physical)   ADLs (physical): IADLs Mobility Comments: independent at baseline, falls x 2 in past month, legs gave out ADLs Comments: reports was cooking, cleaning, driving until recently     Extremity/Trunk Assessment   Upper Extremity Assessment Upper Extremity Assessment: Defer to OT evaluation RUE Deficits / Details: Baseline shoulder dysfunction LUE Deficits / Details: Baseline shoulder dysfunction    Lower Extremity Assessment Lower Extremity Assessment: Generalized weakness    Cervical / Trunk Assessment Cervical / Trunk Assessment: Normal  Communication   Communication Communication: No apparent difficulties    Cognition Arousal: Alert Behavior During Therapy: WFL for tasks assessed/performed   PT - Cognitive impairments: No apparent impairments                         Following commands: Intact       Cueing Cueing Techniques: Verbal cues     General Comments General comments (skin integrity, edema, etc.): pt relates recently had BM though requesting to toilet after ambulation and incontinent in brief on the way to bathroom, assisted for hygiene, pt states diarrhea started since breakfast    Exercises     Assessment/Plan    PT Assessment Patient needs continued PT services  PT Problem List Decreased strength;Decreased mobility;Decreased activity tolerance;Decreased balance;Decreased knowledge of use of DME       PT Treatment Interventions DME instruction;Therapeutic exercise;Gait training;Balance training;Stair training;Functional mobility training;Therapeutic activities;Patient/family education    PT Goals (Current goals can be found in the Care Plan section)  Acute Rehab PT Goals Patient Stated Goal: return to independent PT Goal Formulation: With patient Time For Goal Achievement: 11/24/23 Potential to Achieve Goals:  Good    Frequency Min 2X/week     Co-evaluation               AM-PAC PT "6 Clicks" Mobility  Outcome Measure Help needed turning from your back to your side while in a flat bed without using bedrails?: A Little Help needed moving from lying on your back to sitting on the side of a flat bed without using bedrails?: A Little Help needed moving to and from a bed to a chair (including a wheelchair)?: A Little Help needed standing up from a chair using your arms (e.g., wheelchair or bedside chair)?: A Little Help needed to walk in hospital room?: A Little Help needed climbing 3-5 steps with a railing? : Total 6 Click Score: 16    End of Session Equipment Utilized During Treatment: Gait belt Activity Tolerance: Patient limited by fatigue Patient left: in bed;with call bell/phone within reach   PT Visit Diagnosis: Other abnormalities of gait and mobility (R26.89);History of falling (Z91.81)    Time: 4008-6761 PT Time Calculation (min) (ACUTE ONLY): 35 min   Charges:   PT Evaluation $PT Eval Moderate Complexity: 1 Mod PT Treatments $Gait Training: 8-22 mins PT General Charges $$ ACUTE PT VISIT: 1 Visit         Abigail Hoff, PT Acute Rehabilitation Services [phone] 11/10/2023   Marley Simmers 11/10/2023, 10:41 AM

## 2023-11-10 NOTE — Progress Notes (Addendum)
 1:30pm: CSW returned call to patient's son to inform him patient is ready for discharge. Grahm states he will be here to get patient at 2:30pm.  1pm: CSW spoke with patient's son to discuss discharge plan. CSW explained barriers to SNF placement (patient ambulated 125ft as contact guard assist) - Miko states understanding and is agreeable for patient to return home with home health services (PT,OT). Lawton states he does not have a preference on home health agency. Hastings states patient resides with him and the two of them reside at [address] [place], [place] [address]. Sherrod states patient has a walker for home use. Jakson states he will come and get patient and take him home once ready for discharge.  CSW spoke with Loetta Ringer at [facility] who states the agency can provide patient with [facility] services.  CSW spoke with RN to inform her of plan.  CSW spoke with Dr. Martina Sledge to have [facility] PT and OT ordered.  8:05am: CSW received consult for possible SNF placement.   Therapy team has signed in - CSW will wait for therapy recommendations prior to proceeding with discharge planning.  Shepard Dicker, MSW, LCSW Transitions of Care  Clinical Social Worker II [phone]

## 2023-11-10 NOTE — Evaluation (Signed)
 Occupational Therapy Evaluation Patient Details Name: Micheal Leblanc MRN: [id number] DOB: 12-09-1950 Today's Date: 11/10/2023   History of Present Illness   73 year old male adm 5/14 with history of gait instability thought to be due to neuropathy, heavy alcohol use, diabetes, hypertension, and hyperlipidemia who presented after a fall at home.     Clinical Impressions Patient admitted for the diagnosis above.  PTA he lives at home with his son, who is available on a PRN basis.  Patient presents with the deficits below, currently needing up to Mod A for lower body ADL and Min A for basic transfers.  OT will continue efforts in the acute setting to address deficits, and given limited support at home, Patient will benefit from continued inpatient follow up therapy, <3 hours/day.     If plan is discharge home, recommend the following:   Assist for transportation;Assistance with cooking/housework;A little help with walking and/or transfers;A lot of help with bathing/dressing/bathroom     Functional Status Assessment   Patient has had a recent decline in their functional status and demonstrates the ability to make significant improvements in function in a reasonable and predictable amount of time.     Equipment Recommendations   Tub/shower bench     Recommendations for Other Services         Precautions/Restrictions   Precautions Precautions: Fall Restrictions Weight Bearing Restrictions Per Provider Order: No     Mobility Bed Mobility Overal bed mobility: Needs Assistance Bed Mobility: Supine to Sit     Supine to sit: Mod assist          Transfers Overall transfer level: Needs assistance   Transfers: Sit to/from Stand, Bed to chair/wheelchair/BSC Sit to Stand: Contact guard assist, Min assist     Step pivot transfers: Min assist            Balance Overall balance assessment: Needs assistance Sitting-balance support: Feet supported Sitting  balance-Leahy Scale: Fair     Standing balance support: Reliant on assistive device for balance Standing balance-Leahy Scale: Poor                             ADL either performed or assessed with clinical judgement   ADL Overall ADL's : Needs assistance/impaired Eating/Feeding: Set up;Sitting   Grooming: Wash/dry hands;Contact guard assist;Standing   Upper Body Bathing: Supervision/ safety;Sitting   Lower Body Bathing: Moderate assistance;Sit to/from stand;Minimal assistance   Upper Body Dressing : Supervision/safety;Sitting   Lower Body Dressing: Moderate assistance;Sit to/from stand   Toilet Transfer: Minimal assistance;Rolling walker (2 wheels);Regular Toilet;Ambulation                   Vision Patient Visual Report: No change from baseline       Perception Perception: Not tested       Praxis Praxis: Not tested       Pertinent Vitals/Pain Pain Assessment Pain Assessment: Faces Faces Pain Scale: Hurts a little bit Pain Location: R shoulder Pain Descriptors / Indicators: Tender Pain Intervention(s): Monitored during session     Extremity/Trunk Assessment Upper Extremity Assessment Upper Extremity Assessment: Generalized weakness;Right hand dominant;RUE deficits/detail;LUE deficits/detail RUE Deficits / Details: Baseline shoulder dysfunction LUE Deficits / Details: Baseline shoulder dysfunction   Lower Extremity Assessment Lower Extremity Assessment: Defer to PT evaluation   Cervical / Trunk Assessment Cervical / Trunk Assessment: Normal   Communication Communication Communication: No apparent difficulties   Cognition Arousal: Alert Behavior During Therapy: [facility] for  tasks assessed/performed Cognition: No family/caregiver present to determine baseline             OT - Cognition Comments: Slowed mentation                 Following commands: Impaired Following commands impaired: Follows one step commands with increased  time     Cueing  General Comments   Cueing Techniques: Verbal cues   Watch BP   Exercises     Shoulder Instructions      Home Living Family/patient expects to be discharged to:: Private residence Living Arrangements: Children Available Help at Discharge: Family;Available PRN/intermittently Type of Home: House Home Access: Level entry     Home Layout: One level     Bathroom Shower/Tub: Chief Strategy Officer: Standard Bathroom Accessibility: Yes   Home Equipment: Standard Walker;Cane - single point          Prior Functioning/Environment Prior Level of Function : Needs assist       Physical Assist : ADLs (physical)   ADLs (physical): IADLs   ADLs Comments: Patient stating Mod I with bathing and dressing with increased time.  Sone completes iADL, driving and assists with medications.    OT Problem List: Decreased activity tolerance;Impaired balance (sitting and/or standing);Decreased safety awareness;Decreased cognition   OT Treatment/Interventions: Self-care/ADL training;Therapeutic activities;Patient/family education;Balance training;DME and/or AE instruction      OT Goals(Current goals can be found in the care plan section)   Acute Rehab OT Goals Patient Stated Goal: Get stronger and not fall OT Goal Formulation: With patient Time For Goal Achievement: 11/24/23 Potential to Achieve Goals: Good   OT Frequency:  Min 2X/week    Co-evaluation              AM-PAC OT "6 Clicks" Daily Activity     Outcome Measure Help from another person eating meals?: None Help from another person taking care of personal grooming?: A Little Help from another person toileting, which includes using toliet, bedpan, or urinal?: A Little Help from another person bathing (including washing, rinsing, drying)?: A Lot Help from another person to put on and taking off regular upper body clothing?: None Help from another person to put on and taking off regular  lower body clothing?: A Lot 6 Click Score: 18   End of Session Equipment Utilized During Treatment: Gait belt;Rolling walker (2 wheels) Nurse Communication: Mobility status  Activity Tolerance: Patient tolerated treatment well Patient left: in chair;with call bell/phone within reach  OT Visit Diagnosis: Unsteadiness on feet (R26.81);Muscle weakness (generalized) (M62.81)                Time: 0454-0981 OT Time Calculation (min): 21 min Charges:  OT General Charges $OT Visit: 1 Visit OT Evaluation $OT Eval Moderate Complexity: 1 Mod  11/10/2023  RP, OTR/L  Acute Rehabilitation Services  Office:  [phone]   Micheal Leblanc 11/10/2023, 8:53 AM

## 2023-11-10 NOTE — ED Provider Notes (Signed)
 PT/OT has eval pt Will order home health with pt/ot Family to take pt home Medically cleared by earlier team Stable for dc at this time    [name], DO 11/10/23 1330

## 2023-11-11 ENCOUNTER — Ambulatory Visit (INDEPENDENT_AMBULATORY_CARE_PROVIDER_SITE_OTHER): Admitting: Medical

## 2023-11-11 VITALS — BP 102/64 | HR 97 | Temp 98.0°F

## 2023-11-11 DIAGNOSIS — Z789 Other specified health status: Secondary | ICD-10-CM | POA: Diagnosis not present

## 2023-11-11 DIAGNOSIS — Z7189 Other specified counseling: Secondary | ICD-10-CM

## 2023-11-11 DIAGNOSIS — E119 Type 2 diabetes mellitus without complications: Secondary | ICD-10-CM | POA: Diagnosis not present

## 2023-11-11 DIAGNOSIS — W19XXXA Unspecified fall, initial encounter: Secondary | ICD-10-CM | POA: Insufficient documentation

## 2023-11-11 DIAGNOSIS — E1121 Type 2 diabetes mellitus with diabetic nephropathy: Secondary | ICD-10-CM | POA: Diagnosis not present

## 2023-11-11 DIAGNOSIS — R262 Difficulty in walking, not elsewhere classified: Secondary | ICD-10-CM | POA: Diagnosis not present

## 2023-11-11 DIAGNOSIS — I10 Essential (primary) hypertension: Secondary | ICD-10-CM

## 2023-11-11 DIAGNOSIS — W19XXXD Unspecified fall, subsequent encounter: Secondary | ICD-10-CM

## 2023-11-11 DIAGNOSIS — R531 Weakness: Secondary | ICD-10-CM

## 2023-11-11 DIAGNOSIS — Z711 Person with feared health complaint in whom no diagnosis is made: Secondary | ICD-10-CM | POA: Diagnosis not present

## 2023-11-11 NOTE — Progress Notes (Signed)
 Placed referrals for social worker and Home Health

## 2023-11-11 NOTE — Addendum Note (Signed)
 Addended by: Charliene Conte A on: 11/11/2023 03:49 PM   Modules accepted: Orders

## 2023-11-11 NOTE — Progress Notes (Addendum)
 Subjective:  Micheal Leblanc is a 73 y.o. male who presents for Chief Complaint  Patient presents with   Hospitalization Follow-up    Hospital follow-up, weakness and fall, has rehab that is going to be coming in to help. Would like to see about getting him in Skilled facility. Not eating, having some memory issues.      Here for hospital follow-up.   Here with son  Micheal Leblanc.    He was seen in the emergency department at [facility] 11/09/23.   He went in for frequent falls, weakness and had 2 falls that same day when he hit his head.  He was reportedly on the ground for several hours.  He was evaluated ultimately felt he was safe for discharge but he was advised to do physical therapy and Occupational Therapy in order was reportedly made for these referrals  He has been seen here recently by myself and Dr. Robina Chol for ongoing issues with weakness, has been referred to neurology and physical therapy.  He has had recent imaging and labs as well even before the reason Emergency Department visit  Per his son, he hasn't been eating all that well and not taking his medication like he is suppose to. After recent ED visit, and its like he kind of gave up.  His son Micheal Leblanc feels like he needs to be in a skilled nursing facility at this point.  He wants to know how to make that happen.  He would like a prescription for a wheelchair and possibly a hospital bed.  He also has questions about logistics as he is not his father's power of attorney.  His cousin prior was listed as the power of attorney  No other aggravating or relieving factors.    No other c/o.  Past Medical History:  Diagnosis Date   Allergy    Diabetes mellitus    ED (erectile dysfunction)    Hx of colonic polyps    Hyperlipidemia    Hypertension    Current Outpatient Medications on File Prior to Visit  Medication Sig Dispense Refill   insulin  glargine (LANTUS  SOLOSTAR) 100 UNIT/ML Solostar Pen Inject 55 Units into the skin  daily. (Patient taking differently: Inject 55 Units into the skin daily.) 15 mL 3   meclizine  (ANTIVERT ) 25 MG tablet Take 1 tablet (25 mg total) by mouth 2 (two) times daily. 30 tablet 0   metFORMIN  (GLUCOPHAGE -XR) 750 MG 24 hr tablet Take 1 tablet (750 mg total) by mouth 2 (two) times daily. (Patient taking differently: Take 750 mg by mouth 2 (two) times daily.) 180 tablet 0   Olmesartan -amLODIPine -HCTZ (TRIBENZOR) 40-5-25 MG TABS Take 1 tablet by mouth daily. 90 tablet 3   pioglitazone  (ACTOS ) 30 MG tablet TAKE 1 TABLET BY MOUTH EVERY DAY (Patient taking differently: Take 30 mg by mouth daily.) 90 tablet 1   Accu-Chek Softclix Lancets lancets Use as instructed 200 each 12   atorvastatin  (LIPITOR) 20 MG tablet TAKE 1 TABLET BY MOUTH EVERY DAY 90 tablet 1   glucose blood test strip Use as instructed 200 each 12   Insulin  Pen Needle (PEN NEEDLES) 31G X 8 MM MISC Use this for lantus  solorstar pen 100 each 1   No current facility-administered medications on file prior to visit.     The following portions of the patient's history were reviewed and updated as appropriate: allergies, current medications, past family history, past medical history, past social history, past surgical history and problem list.  ROS Otherwise  as in subjective above    Objective: BP 102/64   Pulse 97   Temp 98 F (36.7 C)   SpO2 95%   Wt Readings from Last 3 Encounters:  11/10/23 145 lb (65.8 kg)  10/04/23 146 lb 12.8 oz (66.6 kg)  10/04/23 147 lb (66.7 kg)    General appearance: alert, no distress, well developed, well nourished Weakness in the legs in general, seated in wheelchair and weak to stand for very long Psych: Pleasant, answers questions appropriately    Assessment: Encounter Diagnoses  Name Primary?   Fall, subsequent encounter Yes   Weakness    Concern about memory    Regular alcohol consumption    Diabetic nephropathy associated with type 2 diabetes mellitus (HCC)    Hypertension  complicating diabetes (HCC)    Advanced directives, counseling/discussion    Ambulatory dysfunction    Decreased activities of daily living (ADL)      Plan: I reviewed his emergency department notes from 11/09/2023 visit for generalized weakness and fall.  CT scan reviewed from 11/09/2023 showing generalized cerebral atrophy and microvascular disease changes in the supratentorial brain but otherwise normal.  CT cervical spine reviewed from 11/09/2023 showing marked severity of multilevel degenerative changes without evidence of acute fracture or subluxation  MMSE: 19/29 today.  Of note I reviewed back over my visit with him back on August 12, 2023.  At that time he was seen for multiple concerns including some joint pains, lack of energy and fatigue, urine odor, decreased appetite, regular alcohol use.  He had a CT chest abdomen pelvis at that time that 09/26/2023 that showed some diverticulosis borderline prostate enlargement but otherwise normal scan  Of note he was prior referred to PT 10/04/23 and neurology referral 08/30/23 and 10/04/23.  Looking through the referral records at times referral coordinator had difficulty reaching him or family.  I reviewed the neurology notes from where he saw neurology on 10/04/2023.  He was seen for gait imbalance, falls and leg weakness and was referred for EMG nerve conduction testing.  Looking back it does not appear that he has ever went for that test.  They apparently went in last week but was 20 minutes late, so it has been rescheduled for EMG/NCS for June 8.  He was seen by physical therapy on 11/07/2023.  Looks like they are going to be seeing him twice a week for 8 weeks regarding cervical spondylolysis, neuropathy and unsteady gait   After long discussion today it sounds like his son Micheal Leblanc works full-time and is not in a position to give his father the supervision care that he needs.  His son Micheal Leblanc has already missed a lot of work trying to help with  his father's issues over the last several months.  He has had several falls  currently not compliant with medications and not eating that well so his son thinks he needs to transition to a skilled nursing facility.  Transportation is limited as well so outpatient physical therapy may not be a good idea.  We will put in referrals today for social worker to help with possible process of looking towards a skilled nursing facility  Referral today for home health physical therapy.  He will still go to his outpatient therapy session next week but will likely transition to home health physical therapy  His mobility limitation cannot be sufficiently resolved using an appropriately fitted cane or walker.   He has sufficient upper extremity function and physical and mental capability  needed to safely self appeal a manual wheelchair provided in the home during a typical day  Follow-up in June as planned for nerve conduction study per neurology  We discussed advanced directives and updating paperwork.  We discussed possible paperwork to get ready for looking at skilled nursing facility or making sure things are up-to-date   Micheal Leblanc was seen today for hospitalization follow-up.  Diagnoses and all orders for this visit:  Fall, subsequent encounter  Weakness  Concern about memory  Regular alcohol consumption  Diabetic nephropathy associated with type 2 diabetes mellitus (HCC)  Hypertension complicating diabetes (HCC)  Advanced directives, counseling/discussion  Ambulatory dysfunction  Decreased activities of daily living (ADL)    Follow up: Pending referrals and call back

## 2023-11-14 DIAGNOSIS — E785 Hyperlipidemia, unspecified: Secondary | ICD-10-CM | POA: Diagnosis not present

## 2023-11-14 DIAGNOSIS — R296 Repeated falls: Secondary | ICD-10-CM | POA: Diagnosis not present

## 2023-11-14 DIAGNOSIS — Z7984 Long term (current) use of oral hypoglycemic drugs: Secondary | ICD-10-CM | POA: Diagnosis not present

## 2023-11-14 DIAGNOSIS — Z794 Long term (current) use of insulin: Secondary | ICD-10-CM | POA: Diagnosis not present

## 2023-11-14 DIAGNOSIS — S0990XD Unspecified injury of head, subsequent encounter: Secondary | ICD-10-CM | POA: Diagnosis not present

## 2023-11-14 DIAGNOSIS — I10 Essential (primary) hypertension: Secondary | ICD-10-CM | POA: Diagnosis not present

## 2023-11-14 DIAGNOSIS — E1140 Type 2 diabetes mellitus with diabetic neuropathy, unspecified: Secondary | ICD-10-CM | POA: Diagnosis not present

## 2023-11-14 DIAGNOSIS — Z9181 History of falling: Secondary | ICD-10-CM | POA: Diagnosis not present

## 2023-11-14 DIAGNOSIS — E118 Type 2 diabetes mellitus with unspecified complications: Secondary | ICD-10-CM | POA: Diagnosis not present

## 2023-11-16 ENCOUNTER — Telehealth: Payer: Self-pay

## 2023-11-16 ENCOUNTER — Other Ambulatory Visit: Payer: Self-pay

## 2023-11-16 NOTE — Telephone Encounter (Unsigned)
 Copied from CRM [id number]. Topic: Clinical - Home Health Verbal Orders >> Nov 16, 2023  8:45 AM Oddis Bench wrote: Caller/Agency: Erin Neely/ [facility] Callback Number: [phone] Service Requested: Physical Therapy and *** Frequency: 1x weekly for 9 weeks Any new concerns about the patient? No

## 2023-11-16 NOTE — Telephone Encounter (Signed)
 Asking for verbal orders for PT. If ok I will call back and give the verbal orders.   Copied from CRM [id number]. Topic: Clinical - Home Health Verbal Orders >> Nov 16, 2023  8:45 AM Oddis Bench wrote: Caller/Agency: Erin Neely/ [facility] Callback Number: [phone] Service Requested: Physical Therapy Frequency: 1x weekly for 9 weeks Any new concerns about the patient? No

## 2023-11-17 ENCOUNTER — Ambulatory Visit: Payer: Self-pay

## 2023-11-17 ENCOUNTER — Ambulatory Visit (HOSPITAL_BASED_OUTPATIENT_CLINIC_OR_DEPARTMENT_OTHER)

## 2023-11-17 DIAGNOSIS — R262 Difficulty in walking, not elsewhere classified: Secondary | ICD-10-CM

## 2023-11-17 DIAGNOSIS — E118 Type 2 diabetes mellitus with unspecified complications: Secondary | ICD-10-CM

## 2023-11-17 DIAGNOSIS — R531 Weakness: Secondary | ICD-10-CM

## 2023-11-17 DIAGNOSIS — Z711 Person with feared health complaint in whom no diagnosis is made: Secondary | ICD-10-CM

## 2023-11-17 NOTE — Telephone Encounter (Signed)
 Patient had questions about using insulin . Patient is with male friend. Male patient states patient is confused on how to use it so patient at side and they are asking for instructions. Kenneth Peace (friend) is stating she believes the insulin  pen is broken, or that it is missing a piece. Armin Landing is also stating that patient's glucometer is broken and referencing November dates. Patient needs evaluation of equipment and insulin  and proper diabetic management. Advising on nurse visit or PCP follow up - no appt availability. Please advise and contact patient or Armin Landing at [phone]  Copied from CRM [id number]. Topic: Clinical - Medication Question >> Nov 17, 2023  5:13 PM Donald Frost wrote: Reason for CRM: The patient called in with questions on how to use his insulin . He would like to speak with a nurse for further assistance. I will transfer the patient to E2C2 NT Reason for Disposition  Health Information question, no triage required and triager able to answer question  Answer Assessment - Initial Assessment Questions 1. REASON FOR CALL or QUESTION: "What is your reason for calling today?" or "How can I best help you?" or "What question do you have that I can help answer?"     Patient asking about questions about insulin . Please see notes.  Protocols used: Information Only Call - No Triage-A-AH

## 2023-11-18 ENCOUNTER — Telehealth: Payer: Self-pay

## 2023-11-18 ENCOUNTER — Other Ambulatory Visit: Payer: Self-pay

## 2023-11-18 NOTE — Telephone Encounter (Signed)
 Copied from CRM [id number]. Topic: Clinical - Order For Equipment >> Nov 18, 2023 12:55 PM Fonda T wrote: Reason for CRM: Patient friend, Kenneth Peace, calling requesting to speak to office regarding DME prescription (wheelchair, and other DME)  and where the equipment can be picked up.  Caller is not on DPR, informed per protocol, she would have to be listed to speak further, Per caller states patient is there beside her while on the phone.  Patient can be contacted at [phone].   Advise patient to [facility].

## 2023-11-18 NOTE — Telephone Encounter (Signed)
 Spoke to patient. He states he knows how to use his insulin  and glucometer. Patient states his meter is working. He just got confused when it was time to use both. Went over instructions on how to use both again. He expressed understanding. Pt would like referral to Diabetic education and referral to [id number] for pharmacist to help. Ok per sarabeth to put in orders. Patient did not want to schedule an appt for next week yet to see Dr. Robina Chol just for re-assessment of how to use both insulin  and meter. Aaron Aas He will call back.

## 2023-11-18 NOTE — Addendum Note (Signed)
 Addended by: Charliene Conte A on: 11/18/2023 11:04 AM   Modules accepted: Orders

## 2023-11-22 ENCOUNTER — Telehealth: Payer: Self-pay | Admitting: *Deleted

## 2023-11-22 ENCOUNTER — Telehealth: Payer: Self-pay | Admitting: Internal Medicine

## 2023-11-22 NOTE — Telephone Encounter (Unsigned)
 Copied from CRM [id number]. Topic: Clinical - Home Health Verbal Orders >> Nov 18, 2023  3:28 PM Leory Rands wrote: Caller/Agency: [place] Neely/ [facility] Callback Number: [phone] Service Requested: Physical Therapy and  Frequency: 1x weekly for 9 weeks Concern: Son has not schedule a follow up

## 2023-11-22 NOTE — Progress Notes (Unsigned)
 Care Guide Pharmacy Note  11/22/2023 Name: Micheal Leblanc MRN: [id number] DOB: 1951-01-31  Referred By: Watson Hacking, MD Reason for referral: Complex Care Management (Initial outreach to schedule referral with [facility] )   Micheal Leblanc is a 73 y.o. year old male who is a primary care patient of Watson Hacking, MD.  Saint Cranker was referred to the pharmacist for assistance related to: DMII  An unsuccessful telephone outreach was attempted today to contact the patient who was referred to the pharmacy team for assistance with medication management. Additional attempts will be made to contact the patient.  Micheal Leblanc  [facility]  [facility], [facility] Guide  Direct Dial: [phone]  Fax [phone]

## 2023-11-23 ENCOUNTER — Telehealth: Payer: Self-pay

## 2023-11-23 DIAGNOSIS — E1140 Type 2 diabetes mellitus with diabetic neuropathy, unspecified: Secondary | ICD-10-CM | POA: Diagnosis not present

## 2023-11-23 DIAGNOSIS — E785 Hyperlipidemia, unspecified: Secondary | ICD-10-CM | POA: Diagnosis not present

## 2023-11-23 DIAGNOSIS — Z7984 Long term (current) use of oral hypoglycemic drugs: Secondary | ICD-10-CM | POA: Diagnosis not present

## 2023-11-23 DIAGNOSIS — Z9181 History of falling: Secondary | ICD-10-CM | POA: Diagnosis not present

## 2023-11-23 DIAGNOSIS — R296 Repeated falls: Secondary | ICD-10-CM | POA: Diagnosis not present

## 2023-11-23 DIAGNOSIS — Z794 Long term (current) use of insulin: Secondary | ICD-10-CM | POA: Diagnosis not present

## 2023-11-23 DIAGNOSIS — S0990XD Unspecified injury of head, subsequent encounter: Secondary | ICD-10-CM | POA: Diagnosis not present

## 2023-11-23 DIAGNOSIS — E118 Type 2 diabetes mellitus with unspecified complications: Secondary | ICD-10-CM | POA: Diagnosis not present

## 2023-11-23 DIAGNOSIS — I10 Essential (primary) hypertension: Secondary | ICD-10-CM | POA: Diagnosis not present

## 2023-11-23 NOTE — Progress Notes (Unsigned)
 Care Guide Pharmacy Note  11/23/2023 Name: HODGE STACHNIK MRN: [id number] DOB: [birth year]  Referred By: Watson Hacking, MD Reason for referral: Complex Care Management (Initial outreach to schedule referral with [facility] Redmond Candle, PharmD)   Barb Levers Wynter is a 73 y.o. year old male who is a primary care patient of Watson Hacking, MD.  Saint Cranker was referred to the pharmacist for assistance related to: DMII  A second unsuccessful telephone outreach was attempted today to contact the patient who was referred to the pharmacy team for assistance with medication management. Additional attempts will be made to contact the patient.  Barnie Bora  [facility]  [facility], [facility] Guide  Direct Dial: [phone]  Fax [phone]

## 2023-11-23 NOTE — Telephone Encounter (Signed)
 Verbal orders.   Copied from CRM [id number]. Topic: Clinical - Home Health Verbal Orders >> Nov 23, 2023  2:55 PM Danna Duster wrote: Caller/Agency: Dorena Gander, [facility] Callback Number: [phone] Service Requested: Occupational Therapy Frequency: 1 week 1 time, 2 weeks 2 times,and 1 week 5 times Any new concerns about the patient? Yes Prescription rollator walker or a 4 wheeled card cent to [facility]

## 2023-11-24 DIAGNOSIS — E118 Type 2 diabetes mellitus with unspecified complications: Secondary | ICD-10-CM | POA: Diagnosis not present

## 2023-11-24 DIAGNOSIS — S0990XD Unspecified injury of head, subsequent encounter: Secondary | ICD-10-CM | POA: Diagnosis not present

## 2023-11-24 DIAGNOSIS — I10 Essential (primary) hypertension: Secondary | ICD-10-CM | POA: Diagnosis not present

## 2023-11-24 DIAGNOSIS — E785 Hyperlipidemia, unspecified: Secondary | ICD-10-CM | POA: Diagnosis not present

## 2023-11-24 DIAGNOSIS — Z9181 History of falling: Secondary | ICD-10-CM | POA: Diagnosis not present

## 2023-11-24 DIAGNOSIS — E1140 Type 2 diabetes mellitus with diabetic neuropathy, unspecified: Secondary | ICD-10-CM | POA: Diagnosis not present

## 2023-11-24 DIAGNOSIS — Z794 Long term (current) use of insulin: Secondary | ICD-10-CM | POA: Diagnosis not present

## 2023-11-24 DIAGNOSIS — R296 Repeated falls: Secondary | ICD-10-CM | POA: Diagnosis not present

## 2023-11-24 DIAGNOSIS — Z7984 Long term (current) use of oral hypoglycemic drugs: Secondary | ICD-10-CM | POA: Diagnosis not present

## 2023-11-24 NOTE — Progress Notes (Signed)
 Care Guide Pharmacy Note  11/24/2023 Name: Micheal Leblanc MRN: [id number] DOB: [birth year]  Referred By: Watson Hacking, MD Reason for referral: Complex Care Management (Initial outreach to schedule referral with [facility] Redmond Candle, PharmD)   Micheal Leblanc is a 73 y.o. year old male who is a primary care patient of Watson Hacking, MD.  Micheal Leblanc was referred to the pharmacist for assistance related to: DMII  Successful contact was made with the patient to discuss pharmacy services including being ready for the pharmacist to call at least 5 minutes before the scheduled appointment time and to have medication bottles and any blood pressure readings ready for review. The patient agreed to meet with the pharmacist via telephone visit on (date/time).12/08/23 at 1:00 PM Micheal Leblanc  [facility], [facility] Guide  Direct Dial: [phone]  Fax [phone]

## 2023-11-24 NOTE — Progress Notes (Signed)
 Complex Care Management Note  Care Guide Note 11/24/2023 Name: JONY LADNIER MRN: [id number] DOB: 1950/10/11  Barb Levers Celli is a 73 y.o. year old male who sees Watson Hacking, MD for primary care. I reached out to Saint Cranker by phone today to offer complex care management services.  Mr. Budney was given information about Complex Care Management services today including:   The Complex Care Management services include support from the care team which includes your Nurse Care Manager, Clinical Social Worker, or Pharmacist.  The Complex Care Management team is here to help remove barriers to the health concerns and goals most important to you. Complex Care Management services are voluntary, and the patient may decline or stop services at any time by request to their care team member.   Complex Care Management Consent Status: Patient agreed to services and verbal consent obtained.   Follow up plan:  Telephone appointment with complex care management team member scheduled for:  12/01/23  Encounter Outcome:  Patient Scheduled  Barnie Bora  [facility]  [facility], [facility] Guide  Direct Dial: [phone]  Fax [phone]

## 2023-11-25 ENCOUNTER — Ambulatory Visit (INDEPENDENT_AMBULATORY_CARE_PROVIDER_SITE_OTHER): Admitting: Family Medicine

## 2023-11-25 ENCOUNTER — Encounter: Payer: Self-pay | Admitting: Family Medicine

## 2023-11-25 DIAGNOSIS — Z794 Long term (current) use of insulin: Secondary | ICD-10-CM | POA: Diagnosis not present

## 2023-11-25 DIAGNOSIS — E119 Type 2 diabetes mellitus without complications: Secondary | ICD-10-CM | POA: Diagnosis not present

## 2023-11-25 DIAGNOSIS — E118 Type 2 diabetes mellitus with unspecified complications: Secondary | ICD-10-CM | POA: Diagnosis not present

## 2023-11-25 MED ORDER — LANTUS SOLOSTAR 100 UNIT/ML ~~LOC~~ SOPN
40.0000 [IU] | PEN_INJECTOR | Freq: Every day | SUBCUTANEOUS | 3 refills | Status: AC
Start: 2023-11-25 — End: 2023-12-25

## 2023-11-25 MED ORDER — PIOGLITAZONE HCL 30 MG PO TABS: 30.0000 mg | ORAL_TABLET | Freq: Every day | ORAL | 1 refills | Status: AC

## 2023-11-25 NOTE — Progress Notes (Signed)
   Subjective:    Patient ID: Micheal Leblanc, male    DOB: 1950-07-01, 73 y.o.   MRN: [id number]  HPI He is here with a caregiver who is apparently his neighbor.  She is helping with his care.  He does not have insulin  as he must of run out.  Also needs a refill on his Actos .  He has been out of insulin  for several weeks and there is concerned about the exact dosing since he has lost weight.  He is also set up to be evaluated and [facility] through [facility]  Form was signed.   Review of Systems     Objective:    Physical Exam Alert and in no distress but relatively benign-appearing with flat affect.       Assessment & Plan:  Type 2 diabetes mellitus without complication, with long-term current use of insulin  (HCC) - Plan: insulin  glargine (LANTUS  SOLOSTAR) 100 UNIT/ML Solostar Pen  Type 2 diabetes mellitus with unspecified complications (HCC) - Plan: pioglitazone  (ACTOS ) 30 MG tablet We will attempt to set him up on freestyle libre put him on 40 units of the Lantus .  Discussed close monitoring of his high and low blood sugars and we will adjust accordingly.  I will set him up for a virtual visit in 2 weeks so we can look at the numbers.

## 2023-11-28 ENCOUNTER — Encounter (HOSPITAL_BASED_OUTPATIENT_CLINIC_OR_DEPARTMENT_OTHER)

## 2023-11-28 DIAGNOSIS — R296 Repeated falls: Secondary | ICD-10-CM | POA: Diagnosis not present

## 2023-11-28 DIAGNOSIS — E1140 Type 2 diabetes mellitus with diabetic neuropathy, unspecified: Secondary | ICD-10-CM | POA: Diagnosis not present

## 2023-11-28 DIAGNOSIS — S0990XD Unspecified injury of head, subsequent encounter: Secondary | ICD-10-CM | POA: Diagnosis not present

## 2023-11-28 DIAGNOSIS — Z794 Long term (current) use of insulin: Secondary | ICD-10-CM | POA: Diagnosis not present

## 2023-11-28 DIAGNOSIS — Z9181 History of falling: Secondary | ICD-10-CM | POA: Diagnosis not present

## 2023-11-28 DIAGNOSIS — Z7984 Long term (current) use of oral hypoglycemic drugs: Secondary | ICD-10-CM | POA: Diagnosis not present

## 2023-11-28 DIAGNOSIS — I10 Essential (primary) hypertension: Secondary | ICD-10-CM | POA: Diagnosis not present

## 2023-11-28 DIAGNOSIS — E118 Type 2 diabetes mellitus with unspecified complications: Secondary | ICD-10-CM | POA: Diagnosis not present

## 2023-11-28 DIAGNOSIS — E785 Hyperlipidemia, unspecified: Secondary | ICD-10-CM | POA: Diagnosis not present

## 2023-11-29 DIAGNOSIS — I10 Essential (primary) hypertension: Secondary | ICD-10-CM | POA: Diagnosis not present

## 2023-11-29 DIAGNOSIS — E1140 Type 2 diabetes mellitus with diabetic neuropathy, unspecified: Secondary | ICD-10-CM | POA: Diagnosis not present

## 2023-11-29 DIAGNOSIS — S0990XD Unspecified injury of head, subsequent encounter: Secondary | ICD-10-CM | POA: Diagnosis not present

## 2023-11-29 DIAGNOSIS — Z794 Long term (current) use of insulin: Secondary | ICD-10-CM | POA: Diagnosis not present

## 2023-11-29 DIAGNOSIS — E785 Hyperlipidemia, unspecified: Secondary | ICD-10-CM | POA: Diagnosis not present

## 2023-11-29 DIAGNOSIS — E118 Type 2 diabetes mellitus with unspecified complications: Secondary | ICD-10-CM | POA: Diagnosis not present

## 2023-11-29 DIAGNOSIS — Z9181 History of falling: Secondary | ICD-10-CM | POA: Diagnosis not present

## 2023-11-29 DIAGNOSIS — Z7984 Long term (current) use of oral hypoglycemic drugs: Secondary | ICD-10-CM | POA: Diagnosis not present

## 2023-11-29 DIAGNOSIS — R296 Repeated falls: Secondary | ICD-10-CM | POA: Diagnosis not present

## 2023-11-30 ENCOUNTER — Telehealth: Payer: Self-pay

## 2023-11-30 ENCOUNTER — Encounter (HOSPITAL_BASED_OUTPATIENT_CLINIC_OR_DEPARTMENT_OTHER): Admitting: Physical Therapy

## 2023-11-30 NOTE — Telephone Encounter (Signed)
 Copied from CRM [id number]. Topic: Clinical - Home Health Verbal Orders >> Nov 30, 2023  9:49 AM Lotus Round B wrote: Caller/Agency: sandra from [facility]  Callback Number: [phone] Service Requested: Skilled Nursing Frequency: once a week for 4 weeks and every 2 weeks times 2 Any new concerns about the patient? No but would like the OKAY to consult for a social worker to go out to the home .

## 2023-12-01 ENCOUNTER — Telehealth: Payer: Self-pay

## 2023-12-01 ENCOUNTER — Other Ambulatory Visit: Payer: Self-pay

## 2023-12-01 DIAGNOSIS — S0990XD Unspecified injury of head, subsequent encounter: Secondary | ICD-10-CM | POA: Diagnosis not present

## 2023-12-01 DIAGNOSIS — E118 Type 2 diabetes mellitus with unspecified complications: Secondary | ICD-10-CM | POA: Diagnosis not present

## 2023-12-01 DIAGNOSIS — E1140 Type 2 diabetes mellitus with diabetic neuropathy, unspecified: Secondary | ICD-10-CM | POA: Diagnosis not present

## 2023-12-01 DIAGNOSIS — R296 Repeated falls: Secondary | ICD-10-CM | POA: Diagnosis not present

## 2023-12-01 DIAGNOSIS — Z9181 History of falling: Secondary | ICD-10-CM | POA: Diagnosis not present

## 2023-12-01 DIAGNOSIS — E785 Hyperlipidemia, unspecified: Secondary | ICD-10-CM | POA: Diagnosis not present

## 2023-12-01 DIAGNOSIS — I10 Essential (primary) hypertension: Secondary | ICD-10-CM | POA: Diagnosis not present

## 2023-12-01 DIAGNOSIS — Z794 Long term (current) use of insulin: Secondary | ICD-10-CM | POA: Diagnosis not present

## 2023-12-01 DIAGNOSIS — Z7984 Long term (current) use of oral hypoglycemic drugs: Secondary | ICD-10-CM | POA: Diagnosis not present

## 2023-12-01 NOTE — Patient Outreach (Signed)
 LCSW spoke with patient and patient's friend Armin Landing about services and the referral reason. They informed social worker that they were seeking assistance with getting Medicaid and then a personal care assistant (PCA). They explained that at this time, they would rather apply on line to ascertain if it would be approved. LCSW informed them that once it is approved that they can call LCSW back and she could assist with the PCA process which would involve the PCP completing paperwork. They stated that they were appreciative of LCSW assistance.

## 2023-12-01 NOTE — Telephone Encounter (Signed)
 Copied from CRM [id number]. Topic: Clinical - Medication Question >> Dec 01, 2023 12:33 PM Fonda T wrote: Reason for CRM: Patient son, Dowell (DPR verified), has medication device questions that was placed on father at last visit on 11/25/2023.  Device is [employer].  Confirmed patient has a telephone virtual appointment this afternoon, ok to discuss in detail at that time.   Son is requesting specific directives on how to use and properly operate the new device to check blood sugars.   If needed to speak prior to that time, son can be reached at [phone]

## 2023-12-02 DIAGNOSIS — Z794 Long term (current) use of insulin: Secondary | ICD-10-CM | POA: Diagnosis not present

## 2023-12-02 DIAGNOSIS — S0990XD Unspecified injury of head, subsequent encounter: Secondary | ICD-10-CM | POA: Diagnosis not present

## 2023-12-02 DIAGNOSIS — Z7984 Long term (current) use of oral hypoglycemic drugs: Secondary | ICD-10-CM | POA: Diagnosis not present

## 2023-12-02 DIAGNOSIS — E118 Type 2 diabetes mellitus with unspecified complications: Secondary | ICD-10-CM | POA: Diagnosis not present

## 2023-12-02 DIAGNOSIS — E1140 Type 2 diabetes mellitus with diabetic neuropathy, unspecified: Secondary | ICD-10-CM | POA: Diagnosis not present

## 2023-12-02 DIAGNOSIS — Z9181 History of falling: Secondary | ICD-10-CM | POA: Diagnosis not present

## 2023-12-02 DIAGNOSIS — E785 Hyperlipidemia, unspecified: Secondary | ICD-10-CM | POA: Diagnosis not present

## 2023-12-02 DIAGNOSIS — I10 Essential (primary) hypertension: Secondary | ICD-10-CM | POA: Diagnosis not present

## 2023-12-02 DIAGNOSIS — R296 Repeated falls: Secondary | ICD-10-CM | POA: Diagnosis not present

## 2023-12-05 ENCOUNTER — Encounter (HOSPITAL_BASED_OUTPATIENT_CLINIC_OR_DEPARTMENT_OTHER)

## 2023-12-05 DIAGNOSIS — I10 Essential (primary) hypertension: Secondary | ICD-10-CM | POA: Diagnosis not present

## 2023-12-05 DIAGNOSIS — R296 Repeated falls: Secondary | ICD-10-CM | POA: Diagnosis not present

## 2023-12-05 DIAGNOSIS — E1140 Type 2 diabetes mellitus with diabetic neuropathy, unspecified: Secondary | ICD-10-CM | POA: Diagnosis not present

## 2023-12-05 DIAGNOSIS — Z9181 History of falling: Secondary | ICD-10-CM | POA: Diagnosis not present

## 2023-12-05 DIAGNOSIS — Z7984 Long term (current) use of oral hypoglycemic drugs: Secondary | ICD-10-CM | POA: Diagnosis not present

## 2023-12-05 DIAGNOSIS — Z794 Long term (current) use of insulin: Secondary | ICD-10-CM | POA: Diagnosis not present

## 2023-12-05 DIAGNOSIS — E785 Hyperlipidemia, unspecified: Secondary | ICD-10-CM | POA: Diagnosis not present

## 2023-12-05 DIAGNOSIS — E118 Type 2 diabetes mellitus with unspecified complications: Secondary | ICD-10-CM | POA: Diagnosis not present

## 2023-12-05 DIAGNOSIS — S0990XD Unspecified injury of head, subsequent encounter: Secondary | ICD-10-CM | POA: Diagnosis not present

## 2023-12-07 ENCOUNTER — Encounter (HOSPITAL_BASED_OUTPATIENT_CLINIC_OR_DEPARTMENT_OTHER)

## 2023-12-07 DIAGNOSIS — R296 Repeated falls: Secondary | ICD-10-CM | POA: Diagnosis not present

## 2023-12-07 DIAGNOSIS — E118 Type 2 diabetes mellitus with unspecified complications: Secondary | ICD-10-CM | POA: Diagnosis not present

## 2023-12-07 DIAGNOSIS — I10 Essential (primary) hypertension: Secondary | ICD-10-CM | POA: Diagnosis not present

## 2023-12-07 DIAGNOSIS — Z9181 History of falling: Secondary | ICD-10-CM | POA: Diagnosis not present

## 2023-12-07 DIAGNOSIS — Z7984 Long term (current) use of oral hypoglycemic drugs: Secondary | ICD-10-CM | POA: Diagnosis not present

## 2023-12-07 DIAGNOSIS — S0990XD Unspecified injury of head, subsequent encounter: Secondary | ICD-10-CM | POA: Diagnosis not present

## 2023-12-07 DIAGNOSIS — E785 Hyperlipidemia, unspecified: Secondary | ICD-10-CM | POA: Diagnosis not present

## 2023-12-07 DIAGNOSIS — Z794 Long term (current) use of insulin: Secondary | ICD-10-CM | POA: Diagnosis not present

## 2023-12-07 DIAGNOSIS — E1140 Type 2 diabetes mellitus with diabetic neuropathy, unspecified: Secondary | ICD-10-CM | POA: Diagnosis not present

## 2023-12-08 ENCOUNTER — Other Ambulatory Visit

## 2023-12-08 ENCOUNTER — Telehealth: Admitting: Family Medicine

## 2023-12-08 NOTE — Progress Notes (Deleted)
   12/08/2023  Patient ID: Micheal Leblanc, male   DOB: 09/10/1950, 73 y.o.   MRN: [id number]  Attempted to contact patient for scheduled appointment for medication management. Left HIPAA compliant message for patient to return my call at their convenience.   [name], PharmD Clinical Pharmacist [phone]

## 2023-12-09 DIAGNOSIS — Z7984 Long term (current) use of oral hypoglycemic drugs: Secondary | ICD-10-CM | POA: Diagnosis not present

## 2023-12-09 DIAGNOSIS — Z794 Long term (current) use of insulin: Secondary | ICD-10-CM | POA: Diagnosis not present

## 2023-12-09 DIAGNOSIS — E785 Hyperlipidemia, unspecified: Secondary | ICD-10-CM | POA: Diagnosis not present

## 2023-12-09 DIAGNOSIS — E118 Type 2 diabetes mellitus with unspecified complications: Secondary | ICD-10-CM | POA: Diagnosis not present

## 2023-12-09 DIAGNOSIS — S0990XD Unspecified injury of head, subsequent encounter: Secondary | ICD-10-CM | POA: Diagnosis not present

## 2023-12-09 DIAGNOSIS — E1140 Type 2 diabetes mellitus with diabetic neuropathy, unspecified: Secondary | ICD-10-CM | POA: Diagnosis not present

## 2023-12-09 DIAGNOSIS — R296 Repeated falls: Secondary | ICD-10-CM | POA: Diagnosis not present

## 2023-12-09 DIAGNOSIS — I10 Essential (primary) hypertension: Secondary | ICD-10-CM | POA: Diagnosis not present

## 2023-12-09 DIAGNOSIS — Z9181 History of falling: Secondary | ICD-10-CM | POA: Diagnosis not present

## 2023-12-09 NOTE — Progress Notes (Signed)
 ooo

## 2023-12-12 ENCOUNTER — Encounter (HOSPITAL_BASED_OUTPATIENT_CLINIC_OR_DEPARTMENT_OTHER)

## 2023-12-12 ENCOUNTER — Telehealth: Payer: Self-pay

## 2023-12-12 DIAGNOSIS — I10 Essential (primary) hypertension: Secondary | ICD-10-CM | POA: Diagnosis not present

## 2023-12-12 DIAGNOSIS — Z9181 History of falling: Secondary | ICD-10-CM | POA: Diagnosis not present

## 2023-12-12 DIAGNOSIS — E118 Type 2 diabetes mellitus with unspecified complications: Secondary | ICD-10-CM | POA: Diagnosis not present

## 2023-12-12 DIAGNOSIS — S0990XD Unspecified injury of head, subsequent encounter: Secondary | ICD-10-CM | POA: Diagnosis not present

## 2023-12-12 DIAGNOSIS — R296 Repeated falls: Secondary | ICD-10-CM | POA: Diagnosis not present

## 2023-12-12 DIAGNOSIS — E785 Hyperlipidemia, unspecified: Secondary | ICD-10-CM | POA: Diagnosis not present

## 2023-12-12 DIAGNOSIS — Z7984 Long term (current) use of oral hypoglycemic drugs: Secondary | ICD-10-CM | POA: Diagnosis not present

## 2023-12-12 DIAGNOSIS — E1140 Type 2 diabetes mellitus with diabetic neuropathy, unspecified: Secondary | ICD-10-CM | POA: Diagnosis not present

## 2023-12-12 DIAGNOSIS — Z794 Long term (current) use of insulin: Secondary | ICD-10-CM | POA: Diagnosis not present

## 2023-12-12 NOTE — Progress Notes (Unsigned)
 Care Guide Pharmacy Note  12/12/2023 Name: Micheal Leblanc MRN: [id number] DOB: 12-11-1950  Referred By: Watson Hacking, MD Reason for referral: Complex Care Management (Initial Outreach to schedule with Pharm D -Daria Eddy)   Micheal Leblanc is a 73 y.o. year old male who is a primary care patient of Watson Hacking, MD.  Saint Cranker was referred to the pharmacist for assistance related to: Medication Adherence, Medication Assistance and Disease Management.   An unsuccessful telephone outreach was attempted today to contact the patient who was referred to the pharmacy team for assistance with medication assistance. Additional attempts will be made to contact the patient.  Creola Doheny [facility], [facility] Guide  Direct Dial: [phone]  Fax [phone]

## 2023-12-13 DIAGNOSIS — S0990XD Unspecified injury of head, subsequent encounter: Secondary | ICD-10-CM | POA: Diagnosis not present

## 2023-12-13 DIAGNOSIS — R296 Repeated falls: Secondary | ICD-10-CM | POA: Diagnosis not present

## 2023-12-13 DIAGNOSIS — E1140 Type 2 diabetes mellitus with diabetic neuropathy, unspecified: Secondary | ICD-10-CM | POA: Diagnosis not present

## 2023-12-13 DIAGNOSIS — E118 Type 2 diabetes mellitus with unspecified complications: Secondary | ICD-10-CM | POA: Diagnosis not present

## 2023-12-13 DIAGNOSIS — Z794 Long term (current) use of insulin: Secondary | ICD-10-CM | POA: Diagnosis not present

## 2023-12-13 DIAGNOSIS — I10 Essential (primary) hypertension: Secondary | ICD-10-CM | POA: Diagnosis not present

## 2023-12-13 DIAGNOSIS — E785 Hyperlipidemia, unspecified: Secondary | ICD-10-CM | POA: Diagnosis not present

## 2023-12-13 DIAGNOSIS — Z7984 Long term (current) use of oral hypoglycemic drugs: Secondary | ICD-10-CM | POA: Diagnosis not present

## 2023-12-13 DIAGNOSIS — Z9181 History of falling: Secondary | ICD-10-CM | POA: Diagnosis not present

## 2023-12-13 NOTE — Progress Notes (Signed)
 Complex Care Management Note Care Guide Note  12/13/2023 Name: Micheal Leblanc MRN: [id number] DOB: 09-30-1950   Complex Care Management Outreach Attempts: A second unsuccessful outreach was attempted today to offer the patient with information about available complex care management services.  Follow Up Plan:  Additional outreach attempts will be made to offer the patient complex care management information and services.   Encounter Outcome:  No Answer  Creola Doheny [facility], [facility] Guide  Direct Dial: [phone]  Fax [phone]

## 2023-12-14 ENCOUNTER — Encounter (HOSPITAL_BASED_OUTPATIENT_CLINIC_OR_DEPARTMENT_OTHER): Admitting: Physical Therapy

## 2023-12-14 NOTE — Progress Notes (Signed)
 Care Guide Pharmacy Note  12/14/2023 Name: AMED DATTA MRN: [id number] DOB: Apr 19, 1951  Referred By: Watson Hacking, MD Reason for referral: Complex Care Management (Initial Outreach to schedule with Pharm D -Daria Eddy)   RIYAN HAILE is a 73 y.o. year old male who is a primary care patient of Watson Hacking, MD.  MAVERYK RENSTROM was referred to the pharmacist for assistance related to: Care Guide Pharmacy Note  12/14/2023 Name: RUDRA HOBBINS MRN: [id number] DOB: 14-Jun-1951  Referred By: Watson Hacking, MD Reason for referral: Complex Care Management (Initial Outreach to schedule with Pharm D -Daria Eddy)   FAROUK VIVERO is a 73 y.o. year old male who is a primary care patient of Watson Hacking, MD.  Saint Cranker was referred to the pharmacist for assistance related to: Medication Assistance and Adherence.  A third unsuccessful telephone outreach was attempted today to contact the patient who was referred to the pharmacy team for assistance with medication assistance. The Population Health team is pleased to engage with this patient at any time in the future upon receipt of referral and should he/she be interested in assistance from the [facility] team.  Creola Doheny [facility] Health  [facility], [facility] Guide  Direct Dial: [phone]  Fax [phone]

## 2023-12-19 DIAGNOSIS — I10 Essential (primary) hypertension: Secondary | ICD-10-CM | POA: Diagnosis not present

## 2023-12-19 DIAGNOSIS — S0990XD Unspecified injury of head, subsequent encounter: Secondary | ICD-10-CM | POA: Diagnosis not present

## 2023-12-19 DIAGNOSIS — E785 Hyperlipidemia, unspecified: Secondary | ICD-10-CM | POA: Diagnosis not present

## 2023-12-19 DIAGNOSIS — R296 Repeated falls: Secondary | ICD-10-CM | POA: Diagnosis not present

## 2023-12-19 DIAGNOSIS — Z9181 History of falling: Secondary | ICD-10-CM | POA: Diagnosis not present

## 2023-12-19 DIAGNOSIS — Z7984 Long term (current) use of oral hypoglycemic drugs: Secondary | ICD-10-CM | POA: Diagnosis not present

## 2023-12-19 DIAGNOSIS — E1140 Type 2 diabetes mellitus with diabetic neuropathy, unspecified: Secondary | ICD-10-CM | POA: Diagnosis not present

## 2023-12-19 DIAGNOSIS — E118 Type 2 diabetes mellitus with unspecified complications: Secondary | ICD-10-CM | POA: Diagnosis not present

## 2023-12-19 DIAGNOSIS — Z794 Long term (current) use of insulin: Secondary | ICD-10-CM | POA: Diagnosis not present

## 2023-12-20 ENCOUNTER — Encounter (HOSPITAL_BASED_OUTPATIENT_CLINIC_OR_DEPARTMENT_OTHER): Admitting: Physical Therapy

## 2023-12-20 DIAGNOSIS — I10 Essential (primary) hypertension: Secondary | ICD-10-CM | POA: Diagnosis not present

## 2023-12-20 DIAGNOSIS — Z7984 Long term (current) use of oral hypoglycemic drugs: Secondary | ICD-10-CM | POA: Diagnosis not present

## 2023-12-20 DIAGNOSIS — E785 Hyperlipidemia, unspecified: Secondary | ICD-10-CM | POA: Diagnosis not present

## 2023-12-20 DIAGNOSIS — Z794 Long term (current) use of insulin: Secondary | ICD-10-CM | POA: Diagnosis not present

## 2023-12-20 DIAGNOSIS — E118 Type 2 diabetes mellitus with unspecified complications: Secondary | ICD-10-CM | POA: Diagnosis not present

## 2023-12-20 DIAGNOSIS — Z9181 History of falling: Secondary | ICD-10-CM | POA: Diagnosis not present

## 2023-12-20 DIAGNOSIS — E1140 Type 2 diabetes mellitus with diabetic neuropathy, unspecified: Secondary | ICD-10-CM | POA: Diagnosis not present

## 2023-12-20 DIAGNOSIS — S0990XD Unspecified injury of head, subsequent encounter: Secondary | ICD-10-CM | POA: Diagnosis not present

## 2023-12-20 DIAGNOSIS — R296 Repeated falls: Secondary | ICD-10-CM | POA: Diagnosis not present

## 2023-12-20 DIAGNOSIS — R32 Unspecified urinary incontinence: Secondary | ICD-10-CM

## 2023-12-22 ENCOUNTER — Encounter: Payer: Self-pay | Admitting: Neurology

## 2023-12-22 ENCOUNTER — Encounter: Admitting: Neurology

## 2023-12-22 DIAGNOSIS — Z029 Encounter for administrative examinations, unspecified: Secondary | ICD-10-CM

## 2023-12-23 ENCOUNTER — Encounter (HOSPITAL_BASED_OUTPATIENT_CLINIC_OR_DEPARTMENT_OTHER): Admitting: Physical Therapy

## 2023-12-29 ENCOUNTER — Ambulatory Visit: Admitting: Family Medicine

## 2023-12-29 DIAGNOSIS — E1140 Type 2 diabetes mellitus with diabetic neuropathy, unspecified: Secondary | ICD-10-CM | POA: Diagnosis not present

## 2023-12-29 DIAGNOSIS — R296 Repeated falls: Secondary | ICD-10-CM | POA: Diagnosis not present

## 2023-12-29 DIAGNOSIS — S0990XD Unspecified injury of head, subsequent encounter: Secondary | ICD-10-CM | POA: Diagnosis not present

## 2023-12-29 DIAGNOSIS — Z7984 Long term (current) use of oral hypoglycemic drugs: Secondary | ICD-10-CM | POA: Diagnosis not present

## 2023-12-29 DIAGNOSIS — E118 Type 2 diabetes mellitus with unspecified complications: Secondary | ICD-10-CM | POA: Diagnosis not present

## 2023-12-29 DIAGNOSIS — Z9181 History of falling: Secondary | ICD-10-CM | POA: Diagnosis not present

## 2023-12-29 DIAGNOSIS — E785 Hyperlipidemia, unspecified: Secondary | ICD-10-CM | POA: Diagnosis not present

## 2023-12-29 DIAGNOSIS — I10 Essential (primary) hypertension: Secondary | ICD-10-CM | POA: Diagnosis not present

## 2023-12-29 DIAGNOSIS — Z794 Long term (current) use of insulin: Secondary | ICD-10-CM | POA: Diagnosis not present

## 2024-01-04 DIAGNOSIS — M542 Cervicalgia: Secondary | ICD-10-CM | POA: Diagnosis not present

## 2024-01-04 DIAGNOSIS — R636 Underweight: Secondary | ICD-10-CM | POA: Diagnosis not present

## 2024-01-04 DIAGNOSIS — Z794 Long term (current) use of insulin: Secondary | ICD-10-CM | POA: Diagnosis not present

## 2024-01-04 DIAGNOSIS — E119 Type 2 diabetes mellitus without complications: Secondary | ICD-10-CM | POA: Diagnosis not present

## 2024-01-04 DIAGNOSIS — G8929 Other chronic pain: Secondary | ICD-10-CM | POA: Diagnosis not present

## 2024-01-04 DIAGNOSIS — Z681 Body mass index (BMI) 19 or less, adult: Secondary | ICD-10-CM | POA: Diagnosis not present

## 2024-01-04 DIAGNOSIS — R634 Abnormal weight loss: Secondary | ICD-10-CM | POA: Diagnosis not present

## 2024-01-05 DIAGNOSIS — S0990XD Unspecified injury of head, subsequent encounter: Secondary | ICD-10-CM | POA: Diagnosis not present

## 2024-01-05 DIAGNOSIS — E1140 Type 2 diabetes mellitus with diabetic neuropathy, unspecified: Secondary | ICD-10-CM | POA: Diagnosis not present

## 2024-01-05 DIAGNOSIS — Z7984 Long term (current) use of oral hypoglycemic drugs: Secondary | ICD-10-CM | POA: Diagnosis not present

## 2024-01-05 DIAGNOSIS — Z794 Long term (current) use of insulin: Secondary | ICD-10-CM | POA: Diagnosis not present

## 2024-01-05 DIAGNOSIS — Z9181 History of falling: Secondary | ICD-10-CM | POA: Diagnosis not present

## 2024-01-05 DIAGNOSIS — E118 Type 2 diabetes mellitus with unspecified complications: Secondary | ICD-10-CM | POA: Diagnosis not present

## 2024-01-05 DIAGNOSIS — I10 Essential (primary) hypertension: Secondary | ICD-10-CM | POA: Diagnosis not present

## 2024-01-05 DIAGNOSIS — R296 Repeated falls: Secondary | ICD-10-CM | POA: Diagnosis not present

## 2024-01-05 DIAGNOSIS — E785 Hyperlipidemia, unspecified: Secondary | ICD-10-CM | POA: Diagnosis not present

## 2024-01-06 DIAGNOSIS — I10 Essential (primary) hypertension: Secondary | ICD-10-CM | POA: Diagnosis not present

## 2024-01-06 DIAGNOSIS — Z9181 History of falling: Secondary | ICD-10-CM | POA: Diagnosis not present

## 2024-01-06 DIAGNOSIS — R296 Repeated falls: Secondary | ICD-10-CM | POA: Diagnosis not present

## 2024-01-06 DIAGNOSIS — E785 Hyperlipidemia, unspecified: Secondary | ICD-10-CM | POA: Diagnosis not present

## 2024-01-06 DIAGNOSIS — E118 Type 2 diabetes mellitus with unspecified complications: Secondary | ICD-10-CM | POA: Diagnosis not present

## 2024-01-06 DIAGNOSIS — Z794 Long term (current) use of insulin: Secondary | ICD-10-CM | POA: Diagnosis not present

## 2024-01-06 DIAGNOSIS — Z7984 Long term (current) use of oral hypoglycemic drugs: Secondary | ICD-10-CM | POA: Diagnosis not present

## 2024-01-06 DIAGNOSIS — S0990XD Unspecified injury of head, subsequent encounter: Secondary | ICD-10-CM | POA: Diagnosis not present

## 2024-01-06 DIAGNOSIS — E1140 Type 2 diabetes mellitus with diabetic neuropathy, unspecified: Secondary | ICD-10-CM | POA: Diagnosis not present

## 2024-01-11 DIAGNOSIS — Z794 Long term (current) use of insulin: Secondary | ICD-10-CM | POA: Diagnosis not present

## 2024-01-11 DIAGNOSIS — I10 Essential (primary) hypertension: Secondary | ICD-10-CM | POA: Diagnosis not present

## 2024-01-11 DIAGNOSIS — E1140 Type 2 diabetes mellitus with diabetic neuropathy, unspecified: Secondary | ICD-10-CM | POA: Diagnosis not present

## 2024-01-11 DIAGNOSIS — S0990XD Unspecified injury of head, subsequent encounter: Secondary | ICD-10-CM | POA: Diagnosis not present

## 2024-01-11 DIAGNOSIS — R296 Repeated falls: Secondary | ICD-10-CM | POA: Diagnosis not present

## 2024-01-11 DIAGNOSIS — Z9181 History of falling: Secondary | ICD-10-CM | POA: Diagnosis not present

## 2024-01-11 DIAGNOSIS — E118 Type 2 diabetes mellitus with unspecified complications: Secondary | ICD-10-CM | POA: Diagnosis not present

## 2024-01-11 DIAGNOSIS — Z7984 Long term (current) use of oral hypoglycemic drugs: Secondary | ICD-10-CM | POA: Diagnosis not present

## 2024-01-11 DIAGNOSIS — E785 Hyperlipidemia, unspecified: Secondary | ICD-10-CM | POA: Diagnosis not present

## 2024-01-13 ENCOUNTER — Telehealth: Payer: Self-pay | Admitting: Internal Medicine

## 2024-01-13 DIAGNOSIS — R262 Difficulty in walking, not elsewhere classified: Secondary | ICD-10-CM

## 2024-01-13 DIAGNOSIS — R531 Weakness: Secondary | ICD-10-CM

## 2024-01-13 DIAGNOSIS — Z794 Long term (current) use of insulin: Secondary | ICD-10-CM

## 2024-01-13 NOTE — Telephone Encounter (Signed)
 Copied from CRM [id number]. Topic: Clinical - Home Health Verbal Orders >> Jan 13, 2024  2:35 PM Charlet HERO wrote: Caller/Agency: [facility] Callback Number: [phone]  ( secured line) Service Requested: Skilled Nursing Frequency: 1x 4 week Any new concerns about the patient? No

## 2024-01-16 ENCOUNTER — Telehealth: Payer: Self-pay

## 2024-01-16 NOTE — Progress Notes (Unsigned)
 Complex Care Management Note Care Guide Note  01/16/2024 Name: Micheal Leblanc MRN: [id number] DOB: 21-Dec-1950   Complex Care Management Outreach Attempts: An unsuccessful telephone outreach was attempted today to offer the patient information about available complex care management services.  Follow Up Plan:  Additional outreach attempts will be made to offer the patient complex care management information and services.   Encounter Outcome:  No Answer  Leotis Rase [facility], [facility] Guide  Direct Dial: [phone]  Fax [phone]

## 2024-01-17 NOTE — Progress Notes (Unsigned)
 Complex Care Management Note Care Guide Note  01/17/2024 Name: Micheal Leblanc MRN: [id number] DOB: May 28, 1951   Complex Care Management Outreach Attempts: A second unsuccessful outreach was attempted today to offer the patient with information about available complex care management services.  Follow Up Plan:  Additional outreach attempts will be made to offer the patient complex care management information and services.   Encounter Outcome:  No Answer  Leotis Rase [facility], [facility] Guide  Direct Dial: [phone]  Fax [phone]

## 2024-01-18 DIAGNOSIS — N1832 Chronic kidney disease, stage 3b: Secondary | ICD-10-CM | POA: Diagnosis not present

## 2024-01-18 DIAGNOSIS — Z681 Body mass index (BMI) 19 or less, adult: Secondary | ICD-10-CM | POA: Diagnosis not present

## 2024-01-18 DIAGNOSIS — R636 Underweight: Secondary | ICD-10-CM | POA: Diagnosis not present

## 2024-01-18 DIAGNOSIS — E1122 Type 2 diabetes mellitus with diabetic chronic kidney disease: Secondary | ICD-10-CM | POA: Diagnosis not present

## 2024-01-18 NOTE — Progress Notes (Signed)
 Complex Care Management Note Care Guide Note  01/18/2024 Name: Micheal Leblanc MRN: [id number] DOB: Sep 28, 1950   Complex Care Management Outreach Attempts: A third unsuccessful outreach was attempted today to offer the patient with information about available complex care management services.  Follow Up Plan:  No further outreach attempts will be made at this time. We have been unable to contact the patient to offer or enroll patient in complex care management services.  Encounter Outcome:  No Answer  Leotis Rase [facility], [facility] Guide  Direct Dial: [phone]  Fax [phone]

## 2024-01-20 ENCOUNTER — Telehealth: Payer: Self-pay

## 2024-01-20 NOTE — Telephone Encounter (Signed)
 Copied from CRM [id number]. Topic: Clinical - Home Health Verbal Orders >> Jan 20, 2024  8:58 AM Graeme ORN wrote: Caller/Agency: [facility] Callback Number: [phone] - secure vm  Service Requested: Skilled Nursing Frequency: 1 week 4 for med management  Any new concerns about the patient? No

## 2024-01-23 NOTE — Progress Notes (Signed)
 Patient ID:  Micheal Leblanc is a 73 y.o. (DOB Jun 06, 1951) male.  Assessment and Plan   1. Underweight (BMI < 18.5)  dronabinol (MARINOL) 2.5 mg capsule  2. Decreased appetite  dronabinol (MARINOL) 2.5 mg capsule  3. Type 2 diabetes mellitus with stage 3b chronic kidney disease, without long-term current use of insulin  (*)  Ambulatory referral to Nephrology   Ambulatory referral to Endocrinology     (1. And 2.)  This is a progression of a chronic problem.  Patient has lost 2 pounds over the last 2 weeks despite treatment with megestrol and attempting increased caloric intake.  Reinforced with patient the importance of maintaining a healthy body weight, and the high risk of morbidity and mortality with a BMI <18.  Megestrol has been unsuccessful at weight gain.  Discontinued Megestrol.  Will attempt treatment with Marinol.  Prescribed Marinol 2.5 mg daily.  Follow-up in 1 month. At patient's last visit on 01/04/2024 his CMP demonstrated elevated serum creatinine at 1.88 and decreased GFR at 37.  A referral to nephrology was recommended and patient agreed to a referral being placed today.  Patient also has a diagnosis of type 2 diabetes mellitus and requested referral to endocrinology for management, despite being told that this diagnosis could be managed through this clinic.  Referrals to nephrology and endocrinology placed today.  Follow-up with this clinic as needed following appointments with nephrology and endocrinology.   Risks, benefits, and alternatives of the medications and treatment plan prescribed today were discussed, and patient expressed understanding.    Patient's Medications       * Accurate as of January 18, 2024 11:59 PM. Reflects encounter med changes as of last refresh          New Prescriptions      Instructions  dronabinol 2.5 mg capsule Commonly known as: MARINOL Started by: Yvonna Gamble, PA  2.5 mg, Oral, Daily       Continued Medications      Instructions   atorvastatin  20 mg tablet Commonly known as: LIPITOR  20 mg, Daily   glucose blood test strip  Use as instructed   LANTUS  SOLOSTAR 100 UNIT/ML Sopn  40 Units, Daily   meclizine  HCl 25 mg tablet Commonly known as: ANTIVERT   25 mg, 2 times a day   metFORMIN  HCl 850 mg tablet Commonly known as: GLUCOPHAGE   850 mg, Two times a day with meals   pioglitazone  30 MG tablet Commonly known as: ACTOS   30 mg, Daily        Orders Placed This Encounter  Procedures  . Ambulatory referral to Nephrology  . Ambulatory referral to Endocrinology      Subjective   Patient ID:  Micheal Leblanc is a 73 y.o. (DOB [birth year]) male    Patient presents with  . Follow-up     HPI: Micheal Leblanc presents with the following complaints:  - Weight check for underweight status. - Last seen 2 weeks ago and weighed 120 lb (BMI 15.67). - Weight down to 118 lb today. - Patient has been using megestrol for appetite stimulation.  - Would like to discuss alternatives. - States he becomes full quickly and still does not have much of an appetite despite the megestrol. - Has been trying to consume high calorie foods, such as milk shakes. - Also is interested in referrals to nephrology and endocrinology for management of chronic kidney disease and type 2 diabetes mellitus, respectively. - Denies: Fever, chills, nausea, vomiting, diarrhea, constipation, and night sweats.  Reviewed and updated this visit by provider: Tobacco  Allergies  Meds  Problems  Med Hx  Surg Hx  Fam Hx  PDMP       ROS: Review of Systems - See HPI  Objective   Vitals: BP 126/72 (BP Location: Left Upper Arm, Patient Position: Sitting)   Pulse 107   Temp 97.9 F (36.6 C) (Temporal)   Ht 6' 1 (1.854 m)   Wt 118 lb 12.8 oz (53.9 kg)   BMI 15.67 kg/m   Physical Exam: Physical Exam Constitutional:      General: He is not in acute distress.    Appearance: Normal appearance. He is underweight.  HENT:     Head:  Normocephalic and atraumatic.   Eyes:     Conjunctiva/sclera: Conjunctivae normal.    Cardiovascular:     Rate and Rhythm: Normal rate and regular rhythm.     Heart sounds: S1 normal and S2 normal. Heart sounds not distant. No murmur heard.    No friction rub. No gallop. No S3 or S4 sounds.  Pulmonary:     Effort: Pulmonary effort is normal.     Breath sounds: Normal breath sounds.   Skin:    General: Skin is cool and dry.  Neurological:     Mental Status: He is alert.   *Some images could not be shown.

## 2024-01-25 ENCOUNTER — Telehealth: Payer: Self-pay

## 2024-01-25 NOTE — Telephone Encounter (Signed)
 Copied from CRM [id number]. Topic: Clinical - Home Health Verbal Orders >> Jan 25, 2024  9:03 AM Myrick DASEN wrote: Caller/Agency: Charmaine from [facility] Callback Number: [phone] Service Requested: Skilled Nursing Frequency: unknown Any new concerns about the patient? No     I called Courtney @ [facility] and she just wanted verbal orders for skilled nursing. I told her that you were okay with him having skilled nursing.

## 2024-01-25 NOTE — Telephone Encounter (Signed)
 Copied from CRM [id number]. Topic: Clinical - Home Health Verbal Orders >> Jan 25, 2024  9:03 AM Myrick DASEN wrote: Caller/Agency: Charmaine from [facility] Callback Number: [phone] Service Requested: Skilled Nursing Frequency: unknown Any new concerns about the patient? No   Please advise

## 2024-01-25 NOTE — Telephone Encounter (Signed)
 Courtney @ [facility] called, Gave verbal orders for skilled nursing.

## 2024-01-27 DIAGNOSIS — Z794 Long term (current) use of insulin: Secondary | ICD-10-CM | POA: Diagnosis not present

## 2024-01-27 DIAGNOSIS — R296 Repeated falls: Secondary | ICD-10-CM | POA: Diagnosis not present

## 2024-01-27 DIAGNOSIS — I10 Essential (primary) hypertension: Secondary | ICD-10-CM | POA: Diagnosis not present

## 2024-01-27 DIAGNOSIS — Z9181 History of falling: Secondary | ICD-10-CM | POA: Diagnosis not present

## 2024-01-27 DIAGNOSIS — Z7984 Long term (current) use of oral hypoglycemic drugs: Secondary | ICD-10-CM | POA: Diagnosis not present

## 2024-01-27 DIAGNOSIS — E118 Type 2 diabetes mellitus with unspecified complications: Secondary | ICD-10-CM | POA: Diagnosis not present

## 2024-01-27 DIAGNOSIS — S0990XD Unspecified injury of head, subsequent encounter: Secondary | ICD-10-CM | POA: Diagnosis not present

## 2024-01-27 DIAGNOSIS — E1140 Type 2 diabetes mellitus with diabetic neuropathy, unspecified: Secondary | ICD-10-CM | POA: Diagnosis not present

## 2024-01-27 DIAGNOSIS — E785 Hyperlipidemia, unspecified: Secondary | ICD-10-CM | POA: Diagnosis not present

## 2024-01-31 DIAGNOSIS — E785 Hyperlipidemia, unspecified: Secondary | ICD-10-CM | POA: Diagnosis not present

## 2024-01-31 DIAGNOSIS — S0990XD Unspecified injury of head, subsequent encounter: Secondary | ICD-10-CM | POA: Diagnosis not present

## 2024-01-31 DIAGNOSIS — E1140 Type 2 diabetes mellitus with diabetic neuropathy, unspecified: Secondary | ICD-10-CM | POA: Diagnosis not present

## 2024-01-31 DIAGNOSIS — I10 Essential (primary) hypertension: Secondary | ICD-10-CM | POA: Diagnosis not present

## 2024-01-31 DIAGNOSIS — E118 Type 2 diabetes mellitus with unspecified complications: Secondary | ICD-10-CM | POA: Diagnosis not present

## 2024-01-31 DIAGNOSIS — Z9181 History of falling: Secondary | ICD-10-CM | POA: Diagnosis not present

## 2024-01-31 DIAGNOSIS — Z7984 Long term (current) use of oral hypoglycemic drugs: Secondary | ICD-10-CM | POA: Diagnosis not present

## 2024-01-31 DIAGNOSIS — R296 Repeated falls: Secondary | ICD-10-CM | POA: Diagnosis not present

## 2024-01-31 DIAGNOSIS — R32 Unspecified urinary incontinence: Secondary | ICD-10-CM

## 2024-01-31 DIAGNOSIS — Z794 Long term (current) use of insulin: Secondary | ICD-10-CM | POA: Diagnosis not present

## 2024-02-04 DIAGNOSIS — S0990XD Unspecified injury of head, subsequent encounter: Secondary | ICD-10-CM | POA: Diagnosis not present

## 2024-02-04 DIAGNOSIS — Z9181 History of falling: Secondary | ICD-10-CM | POA: Diagnosis not present

## 2024-02-04 DIAGNOSIS — E118 Type 2 diabetes mellitus with unspecified complications: Secondary | ICD-10-CM | POA: Diagnosis not present

## 2024-02-04 DIAGNOSIS — E1140 Type 2 diabetes mellitus with diabetic neuropathy, unspecified: Secondary | ICD-10-CM | POA: Diagnosis not present

## 2024-02-04 DIAGNOSIS — E785 Hyperlipidemia, unspecified: Secondary | ICD-10-CM | POA: Diagnosis not present

## 2024-02-04 DIAGNOSIS — Z7984 Long term (current) use of oral hypoglycemic drugs: Secondary | ICD-10-CM | POA: Diagnosis not present

## 2024-02-04 DIAGNOSIS — Z794 Long term (current) use of insulin: Secondary | ICD-10-CM | POA: Diagnosis not present

## 2024-02-04 DIAGNOSIS — I10 Essential (primary) hypertension: Secondary | ICD-10-CM | POA: Diagnosis not present

## 2024-02-04 DIAGNOSIS — R296 Repeated falls: Secondary | ICD-10-CM | POA: Diagnosis not present

## 2024-02-07 ENCOUNTER — Encounter: Payer: Self-pay | Admitting: Neurology

## 2024-02-07 ENCOUNTER — Ambulatory Visit: Admitting: Neurology

## 2024-02-07 DIAGNOSIS — E1140 Type 2 diabetes mellitus with diabetic neuropathy, unspecified: Secondary | ICD-10-CM | POA: Diagnosis not present

## 2024-02-07 DIAGNOSIS — S0990XD Unspecified injury of head, subsequent encounter: Secondary | ICD-10-CM | POA: Diagnosis not present

## 2024-02-07 DIAGNOSIS — I10 Essential (primary) hypertension: Secondary | ICD-10-CM | POA: Diagnosis not present

## 2024-02-07 DIAGNOSIS — Z7984 Long term (current) use of oral hypoglycemic drugs: Secondary | ICD-10-CM | POA: Diagnosis not present

## 2024-02-07 DIAGNOSIS — Z9181 History of falling: Secondary | ICD-10-CM | POA: Diagnosis not present

## 2024-02-07 DIAGNOSIS — R296 Repeated falls: Secondary | ICD-10-CM | POA: Diagnosis not present

## 2024-02-07 DIAGNOSIS — Z794 Long term (current) use of insulin: Secondary | ICD-10-CM | POA: Diagnosis not present

## 2024-02-07 DIAGNOSIS — E118 Type 2 diabetes mellitus with unspecified complications: Secondary | ICD-10-CM | POA: Diagnosis not present

## 2024-02-07 DIAGNOSIS — E785 Hyperlipidemia, unspecified: Secondary | ICD-10-CM | POA: Diagnosis not present

## 2024-02-15 ENCOUNTER — Telehealth: Payer: Self-pay | Admitting: Neurology

## 2024-02-15 NOTE — Telephone Encounter (Signed)
 We are writing to inform you that [facility], including all providers within this practice, will no longer be able to provide medical care to you.  This decision is the result of repeated missed appointments without adequate notice, which has disrupted our ability to provide timely and effective care to all patients.02/07/24

## 2024-02-20 DIAGNOSIS — R636 Underweight: Secondary | ICD-10-CM | POA: Diagnosis not present

## 2024-02-20 DIAGNOSIS — G8929 Other chronic pain: Secondary | ICD-10-CM | POA: Diagnosis not present

## 2024-02-20 DIAGNOSIS — Z681 Body mass index (BMI) 19 or less, adult: Secondary | ICD-10-CM | POA: Diagnosis not present

## 2024-02-20 DIAGNOSIS — M25511 Pain in right shoulder: Secondary | ICD-10-CM | POA: Diagnosis not present

## 2024-03-12 ENCOUNTER — Other Ambulatory Visit: Payer: Self-pay | Admitting: Family Medicine

## 2024-03-12 NOTE — Telephone Encounter (Signed)
 Called Patient to schedule Physical, No answer

## 2024-03-22 DIAGNOSIS — Z681 Body mass index (BMI) 19 or less, adult: Secondary | ICD-10-CM | POA: Diagnosis not present

## 2024-03-22 DIAGNOSIS — Z794 Long term (current) use of insulin: Secondary | ICD-10-CM | POA: Diagnosis not present

## 2024-03-22 DIAGNOSIS — E1122 Type 2 diabetes mellitus with diabetic chronic kidney disease: Secondary | ICD-10-CM | POA: Diagnosis not present

## 2024-03-22 DIAGNOSIS — N1832 Chronic kidney disease, stage 3b: Secondary | ICD-10-CM | POA: Diagnosis not present

## 2024-03-22 DIAGNOSIS — R636 Underweight: Secondary | ICD-10-CM | POA: Diagnosis not present

## 2024-03-22 DIAGNOSIS — E785 Hyperlipidemia, unspecified: Secondary | ICD-10-CM | POA: Diagnosis not present

## 2024-03-22 DIAGNOSIS — Z23 Encounter for immunization: Secondary | ICD-10-CM | POA: Diagnosis not present

## 2024-03-22 DIAGNOSIS — E1169 Type 2 diabetes mellitus with other specified complication: Secondary | ICD-10-CM | POA: Diagnosis not present

## 2024-03-27 DIAGNOSIS — E1122 Type 2 diabetes mellitus with diabetic chronic kidney disease: Secondary | ICD-10-CM | POA: Diagnosis not present

## 2024-03-27 DIAGNOSIS — R030 Elevated blood-pressure reading, without diagnosis of hypertension: Secondary | ICD-10-CM | POA: Diagnosis not present

## 2024-03-27 DIAGNOSIS — N1832 Chronic kidney disease, stage 3b: Secondary | ICD-10-CM | POA: Diagnosis not present

## 2024-03-27 DIAGNOSIS — E785 Hyperlipidemia, unspecified: Secondary | ICD-10-CM | POA: Diagnosis not present

## 2024-03-27 DIAGNOSIS — E1169 Type 2 diabetes mellitus with other specified complication: Secondary | ICD-10-CM | POA: Diagnosis not present

## 2024-03-27 DIAGNOSIS — Z794 Long term (current) use of insulin: Secondary | ICD-10-CM | POA: Diagnosis not present

## 2024-04-17 NOTE — Progress Notes (Signed)
 Micheal Leblanc                                          MRN: [id number]   04/17/2024   The [facility] Quality Team Specialist reviewed this patient medical record for the purposes of chart review for care gap closure. The following were reviewed: chart review for care gap closure-diabetic eye exam and kidney health evaluation for diabetes:eGFR  and uACR.    [facility]BCI Quality Team

## 2024-04-20 DIAGNOSIS — Z794 Long term (current) use of insulin: Secondary | ICD-10-CM | POA: Diagnosis not present

## 2024-04-20 DIAGNOSIS — E1122 Type 2 diabetes mellitus with diabetic chronic kidney disease: Secondary | ICD-10-CM | POA: Diagnosis not present

## 2024-04-20 DIAGNOSIS — N1832 Chronic kidney disease, stage 3b: Secondary | ICD-10-CM | POA: Diagnosis not present

## 2024-04-20 DIAGNOSIS — E785 Hyperlipidemia, unspecified: Secondary | ICD-10-CM | POA: Diagnosis not present

## 2024-04-20 DIAGNOSIS — E1169 Type 2 diabetes mellitus with other specified complication: Secondary | ICD-10-CM | POA: Diagnosis not present

## 2024-04-30 ENCOUNTER — Encounter: Payer: Self-pay | Admitting: Radiology

## 2024-07-17 ENCOUNTER — Ambulatory Visit: Admitting: Podiatry

## 2024-07-23 ENCOUNTER — Ambulatory Visit: Admitting: Podiatry

## 2024-07-30 ENCOUNTER — Ambulatory Visit: Admitting: Podiatry

## 2024-09-04 ENCOUNTER — Ambulatory Visit: Admitting: Family Medicine
